# Patient Record
Sex: Female | Born: 1945
Health system: Southern US, Community
[De-identification: ages and names within clinical notes are randomized; demographics above are authoritative.]

## PROBLEM LIST (undated history)

## (undated) DIAGNOSIS — E669 Obesity, unspecified: Secondary | ICD-10-CM

## (undated) DIAGNOSIS — C44300 Unspecified malignant neoplasm of skin of unspecified part of face: Secondary | ICD-10-CM

## (undated) DIAGNOSIS — F32A Depression, unspecified: Secondary | ICD-10-CM

## (undated) DIAGNOSIS — R011 Cardiac murmur, unspecified: Secondary | ICD-10-CM

## (undated) DIAGNOSIS — G709 Myoneural disorder, unspecified: Secondary | ICD-10-CM

## (undated) DIAGNOSIS — K219 Gastro-esophageal reflux disease without esophagitis: Secondary | ICD-10-CM

## (undated) DIAGNOSIS — Z1239 Encounter for other screening for malignant neoplasm of breast: Secondary | ICD-10-CM

## (undated) DIAGNOSIS — Z8719 Personal history of other diseases of the digestive system: Secondary | ICD-10-CM

## (undated) DIAGNOSIS — R112 Nausea with vomiting, unspecified: Secondary | ICD-10-CM

## (undated) DIAGNOSIS — J45909 Unspecified asthma, uncomplicated: Secondary | ICD-10-CM

## (undated) DIAGNOSIS — Z9889 Other specified postprocedural states: Secondary | ICD-10-CM

## (undated) DIAGNOSIS — N6019 Diffuse cystic mastopathy of unspecified breast: Secondary | ICD-10-CM

## (undated) DIAGNOSIS — M1990 Unspecified osteoarthritis, unspecified site: Secondary | ICD-10-CM

## (undated) DIAGNOSIS — I10 Essential (primary) hypertension: Secondary | ICD-10-CM

## (undated) DIAGNOSIS — I8390 Asymptomatic varicose veins of unspecified lower extremity: Secondary | ICD-10-CM

## (undated) DIAGNOSIS — K635 Polyp of colon: Secondary | ICD-10-CM

## (undated) DIAGNOSIS — E039 Hypothyroidism, unspecified: Secondary | ICD-10-CM

## (undated) DIAGNOSIS — Z1211 Encounter for screening for malignant neoplasm of colon: Secondary | ICD-10-CM

## (undated) DIAGNOSIS — T7840XA Allergy, unspecified, initial encounter: Secondary | ICD-10-CM

## (undated) DIAGNOSIS — K439 Ventral hernia without obstruction or gangrene: Secondary | ICD-10-CM

## (undated) DIAGNOSIS — Z1389 Encounter for screening for other disorder: Secondary | ICD-10-CM

## (undated) DIAGNOSIS — F419 Anxiety disorder, unspecified: Secondary | ICD-10-CM

## (undated) HISTORY — DX: Encounter for screening for malignant neoplasm of colon: Z12.11

## (undated) HISTORY — DX: Cardiac murmur, unspecified: R01.1

## (undated) HISTORY — DX: Unspecified osteoarthritis, unspecified site: M19.90

## (undated) HISTORY — DX: Unspecified malignant neoplasm of skin of unspecified part of face: C44.300

## (undated) HISTORY — PX: BREAST CYST ASPIRATION: SHX578

## (undated) HISTORY — DX: Myoneural disorder, unspecified: G70.9

## (undated) HISTORY — DX: Polyp of colon: K63.5

## (undated) HISTORY — PX: FOOT SURGERY: SHX648

## (undated) HISTORY — DX: Asymptomatic varicose veins of unspecified lower extremity: I83.90

## (undated) HISTORY — PX: TONGUE BIOPSY: SHX1075

## (undated) HISTORY — DX: Depression, unspecified: F32.A

## (undated) HISTORY — DX: Allergy, unspecified, initial encounter: T78.40XA

## (undated) HISTORY — PX: FINE NEEDLE ASPIRATION: SHX406

## (undated) HISTORY — DX: Encounter for other screening for malignant neoplasm of breast: Z12.39

## (undated) HISTORY — DX: Encounter for screening for other disorder: Z13.89

## (undated) HISTORY — DX: Obesity, unspecified: E66.9

## (undated) HISTORY — DX: Ventral hernia without obstruction or gangrene: K43.9

## (undated) HISTORY — DX: Diffuse cystic mastopathy of unspecified breast: N60.19

## (undated) HISTORY — PX: TUBAL LIGATION: SHX77

## (undated) HISTORY — DX: Gastro-esophageal reflux disease without esophagitis: K21.9

---

## 1992-07-17 HISTORY — PX: ABDOMINAL HYSTERECTOMY: SHX81

## 2003-07-18 HISTORY — PX: COLONOSCOPY: SHX174

## 2004-05-10 ENCOUNTER — Ambulatory Visit: Payer: Self-pay | Admitting: General Surgery

## 2004-10-04 ENCOUNTER — Ambulatory Visit: Payer: Self-pay | Admitting: General Surgery

## 2005-07-17 HISTORY — PX: DILATION AND CURETTAGE OF UTERUS: SHX78

## 2005-10-17 ENCOUNTER — Ambulatory Visit: Payer: Self-pay | Admitting: General Surgery

## 2006-10-18 ENCOUNTER — Ambulatory Visit: Payer: Self-pay | Admitting: General Surgery

## 2007-10-22 ENCOUNTER — Ambulatory Visit: Payer: Self-pay | Admitting: General Surgery

## 2008-07-17 HISTORY — PX: LIPOMA EXCISION: SHX5283

## 2008-10-22 ENCOUNTER — Ambulatory Visit: Payer: Self-pay | Admitting: General Surgery

## 2009-07-17 DIAGNOSIS — I8390 Asymptomatic varicose veins of unspecified lower extremity: Secondary | ICD-10-CM

## 2009-07-17 HISTORY — DX: Asymptomatic varicose veins of unspecified lower extremity: I83.90

## 2009-07-17 HISTORY — PX: BREAST MASS EXCISION: SHX1267

## 2009-10-25 ENCOUNTER — Ambulatory Visit: Payer: Self-pay | Admitting: General Surgery

## 2010-11-16 ENCOUNTER — Ambulatory Visit: Payer: Self-pay | Admitting: General Surgery

## 2011-07-18 DIAGNOSIS — E669 Obesity, unspecified: Secondary | ICD-10-CM

## 2011-07-18 DIAGNOSIS — Z1211 Encounter for screening for malignant neoplasm of colon: Secondary | ICD-10-CM

## 2011-07-18 DIAGNOSIS — N6019 Diffuse cystic mastopathy of unspecified breast: Secondary | ICD-10-CM

## 2011-07-18 DIAGNOSIS — Z1389 Encounter for screening for other disorder: Secondary | ICD-10-CM

## 2011-07-18 DIAGNOSIS — Z1239 Encounter for other screening for malignant neoplasm of breast: Secondary | ICD-10-CM

## 2011-07-18 HISTORY — DX: Obesity, unspecified: E66.9

## 2011-07-18 HISTORY — DX: Encounter for screening for malignant neoplasm of colon: Z12.11

## 2011-07-18 HISTORY — DX: Encounter for screening for other disorder: Z13.89

## 2011-07-18 HISTORY — DX: Encounter for other screening for malignant neoplasm of breast: Z12.39

## 2011-07-18 HISTORY — DX: Diffuse cystic mastopathy of unspecified breast: N60.19

## 2011-11-17 ENCOUNTER — Ambulatory Visit: Payer: Self-pay | Admitting: General Surgery

## 2012-09-28 ENCOUNTER — Encounter: Payer: Self-pay | Admitting: *Deleted

## 2012-10-16 ENCOUNTER — Encounter: Payer: Self-pay | Admitting: General Surgery

## 2012-11-18 ENCOUNTER — Ambulatory Visit: Payer: Self-pay | Admitting: General Surgery

## 2012-11-19 ENCOUNTER — Encounter: Payer: Self-pay | Admitting: General Surgery

## 2012-11-25 ENCOUNTER — Other Ambulatory Visit: Payer: Self-pay | Admitting: *Deleted

## 2012-11-25 ENCOUNTER — Encounter: Payer: Self-pay | Admitting: General Surgery

## 2012-11-25 ENCOUNTER — Ambulatory Visit (INDEPENDENT_AMBULATORY_CARE_PROVIDER_SITE_OTHER): Payer: BC Managed Care – PPO | Admitting: General Surgery

## 2012-11-25 VITALS — BP 130/70 | HR 75 | Resp 16 | Ht 65.0 in | Wt 174.0 lb

## 2012-11-25 DIAGNOSIS — Z1231 Encounter for screening mammogram for malignant neoplasm of breast: Secondary | ICD-10-CM

## 2012-11-25 DIAGNOSIS — N6019 Diffuse cystic mastopathy of unspecified breast: Secondary | ICD-10-CM

## 2012-11-25 NOTE — Patient Instructions (Addendum)
Patient to return in one year after mammogram.

## 2012-11-25 NOTE — Progress Notes (Signed)
Patient ID: Joceline Hinchcliff, female   DOB: 25-Dec-1945, 67 y.o.   MRN: [id number]  Chief Complaint  Patient presents with  . Other    mammo    HPI Raquelle Pietro is a 67 y.o. female  here for a follow up fibrocystic disease.Patient reports no new breast symptoms. Since last visit patient had a stress test and passed it HPI  Past Medical History  Diagnosis Date  . Diffuse cystic mastopathy 2013  . Breast screening, unspecified 2013  . Special screening for malignant neoplasms, colon 2013  . Obesity, unspecified 2013  . Screening for obesity 2013  . Asymptomatic varicose veins 2011    Past Surgical History  Procedure Laterality Date  . Breast mass excision Left 2011  . Dilation and curettage of uterus  2007  . Abdominal hysterectomy  1994  . Colonoscopy  2007    done by Dr. Evette Cristal  . Breast cyst aspiration Right 1997?    breast aspiration d/t infected milk gland  . Lipoma excision  2010    breast bone    Family History  Problem Relation Age of Onset  . Liver cancer Mother   . Breast cancer Other     Social History History  Substance Use Topics  . Smoking status: Never Smoker   . Smokeless tobacco: Not on file  . Alcohol Use: No    Allergies  Allergen Reactions  . Shellfish Allergy Other (See Comments)    Throat closes  . Sulfa Antibiotics Swelling  . Neosporin (Neomycin-Bacitracin Zn-Polymyx) Rash    Current Outpatient Prescriptions  Medication Sig Dispense Refill  . Ascorbic Acid (VITAMIN C PO) Take by mouth.      Marland Kitchen aspirin 81 MG tablet Take 81 mg by mouth daily.      . beclomethasone (BECONASE-AQ) 42 MCG/SPRAY nasal spray Place 2 sprays into the nose as directed. Dose is for each nostril.      . Calcium-Magnesium-Vitamin D (CALCIUM 500 PO) Take by mouth.      . chlorzoxazone (PARAFON) 500 MG tablet Take 500 mg by mouth as needed for muscle spasms.      . Estradiol (ESTRACE PO) Take by mouth as directed.      . Fish Oil OIL by Does not apply route as  directed.      . Ipratropium-Albuterol (COMBIVENT IN) Inhale into the lungs as directed.      . loratadine-pseudoephedrine (CLARITIN-D 12-HOUR) 5-120 MG per tablet Take 1 tablet by mouth as needed for allergies.      . meprobamate (EQUANIL) 400 MG tablet Take 400 mg by mouth as needed for pain.      . metroNIDAZOLE (METROCREAM) 0.75 % cream Apply 1 application topically daily.      . Multiple Vitamin (MULTIVITAMIN) capsule Take 1 capsule by mouth daily.      . Zafirlukast (ACCOLATE PO) Take 20 mg by mouth 2 (two) times daily as needed.       Marland Kitchen VITAMIN E PO Take by mouth.       No current facility-administered medications for this visit.    Review of Systems Review of Systems  Constitutional: Negative.   HENT: Negative.   Respiratory: Negative.   Cardiovascular: Negative.     Blood pressure 130/70, pulse 75, resp. rate 16, height 5\' 5"  (1.651 m), weight 174 lb (78.926 kg).  Physical Exam Physical Exam  Constitutional: She is oriented to person, place, and time. She appears well-developed and well-nourished.  Eyes: Conjunctivae are normal. No scleral icterus.  Neck: Neck supple.  Cardiovascular: Normal rate, regular rhythm and normal heart sounds.   Pulmonary/Chest: Effort normal and breath sounds normal. Right breast exhibits no inverted nipple, no mass, no nipple discharge, no skin change and no tenderness. Left breast exhibits no inverted nipple, no mass, no nipple discharge, no skin change and no tenderness.  Abdominal: Soft. Bowel sounds are normal.  Lymphadenopathy:    She has no cervical adenopathy.    She has no axillary adenopathy.  Neurological: She is alert and oriented to person, place, and time.    Data Reviewed Mammogram reviewed  Cat 2   Assessment    Stable exam     Plan   1 year follow up  mammogram      SANKAR,SEEPLAPUTHUR G 11/26/2012, 6:29 PM

## 2012-11-25 NOTE — Progress Notes (Signed)
Patient will be asked to return to the office in one year for a bilateral screening mammogram. 

## 2012-11-26 ENCOUNTER — Encounter: Payer: Self-pay | Admitting: General Surgery

## 2012-11-26 DIAGNOSIS — N6019 Diffuse cystic mastopathy of unspecified breast: Secondary | ICD-10-CM | POA: Insufficient documentation

## 2012-12-17 ENCOUNTER — Ambulatory Visit: Payer: Self-pay | Admitting: General Surgery

## 2013-11-25 ENCOUNTER — Encounter: Payer: Self-pay | Admitting: General Surgery

## 2013-12-09 ENCOUNTER — Encounter: Payer: Self-pay | Admitting: General Surgery

## 2013-12-09 ENCOUNTER — Ambulatory Visit (INDEPENDENT_AMBULATORY_CARE_PROVIDER_SITE_OTHER): Payer: BC Managed Care – PPO | Admitting: General Surgery

## 2013-12-09 VITALS — BP 128/74 | HR 76 | Resp 12 | Ht 63.0 in | Wt 175.0 lb

## 2013-12-09 DIAGNOSIS — Z87898 Personal history of other specified conditions: Secondary | ICD-10-CM

## 2013-12-09 DIAGNOSIS — Z1231 Encounter for screening mammogram for malignant neoplasm of breast: Secondary | ICD-10-CM

## 2013-12-09 NOTE — Patient Instructions (Addendum)
Patient will be asked to return to the office in one year with a bilateral screening mammogram.  Continue self breast exams. Call office for any new breast issues or concerns.  

## 2013-12-09 NOTE — Progress Notes (Signed)
Patient ID: Meredith Scott, female   DOB: [birth year], 68 y.o.   MRN: [id number]  Chief Complaint  Patient presents with  . Follow-up    mammogram    HPI Meredith Scott is a 68 y.o. female who presents for a breast evaluation. The most recent mammogram was done on 11/25/13 at [facility]. Patient does perform regular self breast checks and gets regular mammograms done. No breast problems noted. Patient states she is having thyroid problems. Being followed by Dr. Venia Minks.  HPI  Past Medical History  Diagnosis Date  . Diffuse cystic mastopathy 2013  . Breast screening, unspecified 2013  . Special screening for malignant neoplasms, colon 2013  . Obesity, unspecified 2013  . Screening for obesity 2013  . Asymptomatic varicose veins 2011    Past Surgical History  Procedure Laterality Date  . Breast mass excision Left 2011  . Dilation and curettage of uterus  2007  . Abdominal hysterectomy  1994  . Colonoscopy  2007    done by Dr. Jamal Collin  . Breast cyst aspiration Right 1997?    breast aspiration d/t infected milk gland  . Lipoma excision  2010    breast bone    Family History  Problem Relation Age of Onset  . Liver cancer Mother   . Breast cancer Other   . Diabetes Father     Social History History  Substance Use Topics  . Smoking status: Never Smoker   . Smokeless tobacco: Never Used  . Alcohol Use: No    Allergies  Allergen Reactions  . Shellfish Allergy Other (See Comments)    Throat closes  . Sulfa Antibiotics Swelling  . Neosporin [Neomycin-Bacitracin Zn-Polymyx] Rash    Current Outpatient Prescriptions  Medication Sig Dispense Refill  . Ascorbic Acid (VITAMIN C PO) Take by mouth.      Marland Kitchen aspirin 81 MG tablet Take 81 mg by mouth daily.      . beclomethasone (BECONASE-AQ) 42 MCG/SPRAY nasal spray Place 2 sprays into the nose as directed. Dose is for each nostril.      . Calcium-Magnesium-Vitamin D (CALCIUM 500 PO) Take by mouth.      . chlorzoxazone (PARAFON) 500 MG  tablet Take 500 mg by mouth as needed for muscle spasms.      . Estradiol (ESTRACE PO) Take by mouth as directed.      . Fish Oil OIL by Does not apply route as directed.      . Ipratropium-Albuterol (COMBIVENT IN) Inhale into the lungs as directed.      Marland Kitchen levothyroxine (SYNTHROID, LEVOTHROID) 25 MCG tablet Take 1 tablet by mouth daily.      Marland Kitchen loratadine-pseudoephedrine (CLARITIN-D 12-HOUR) 5-120 MG per tablet Take 1 tablet by mouth as needed for allergies.      . meprobamate (EQUANIL) 400 MG tablet Take 400 mg by mouth as needed for pain.      . metroNIDAZOLE (METROCREAM) 0.75 % cream Apply 1 application topically daily.      . Multiple Vitamin (MULTIVITAMIN) capsule Take 1 capsule by mouth daily.      Marland Kitchen VITAMIN E PO Take by mouth.      . Zafirlukast (ACCOLATE PO) Take 20 mg by mouth 2 (two) times daily as needed.        No current facility-administered medications for this visit.    Review of Systems Review of Systems  Constitutional: Negative.   Respiratory: Negative.   Cardiovascular: Negative.     Blood pressure 128/74, pulse 76, resp. rate  12, height 5\' 3"  (1.6 m), weight 175 lb (79.379 kg).  Physical Exam Physical Exam  Constitutional: She is oriented to person, place, and time. She appears well-developed and well-nourished.  Eyes: Conjunctivae are normal. No scleral icterus.  Neck: Neck supple. No thyromegaly present.  Cardiovascular: Normal rate, regular rhythm and normal heart sounds.   Pulmonary/Chest: Effort normal and breath sounds normal. Right breast exhibits no inverted nipple, no mass, no nipple discharge, no skin change and no tenderness. Left breast exhibits no inverted nipple, no mass, no nipple discharge, no skin change and no tenderness.  Abdominal: Soft. Bowel sounds are normal. There is no tenderness.  Lymphadenopathy:    She has no cervical adenopathy.    She has no axillary adenopathy.  Neurological: She is alert and oriented to person, place, and time.   Skin: Skin is warm and dry.    Data Reviewed Mammogram reviewed Assessment    Stable exam, History of fibrocyst diseases    Plan    Patient will be asked to return to the office in one year with a bilateral screening mammogram.       Garrie Woodin G Kei Mcelhiney 12/09/2013, 1:03 PM

## 2013-12-23 DIAGNOSIS — E039 Hypothyroidism, unspecified: Secondary | ICD-10-CM | POA: Insufficient documentation

## 2014-05-18 ENCOUNTER — Encounter: Payer: Self-pay | Admitting: General Surgery

## 2014-06-04 ENCOUNTER — Ambulatory Visit: Payer: Self-pay | Admitting: Otolaryngology

## 2014-07-20 ENCOUNTER — Ambulatory Visit: Payer: Self-pay | Admitting: Otolaryngology

## 2014-07-23 ENCOUNTER — Ambulatory Visit: Payer: BC Managed Care – PPO | Admitting: General Surgery

## 2014-08-05 ENCOUNTER — Encounter: Payer: Self-pay | Admitting: *Deleted

## 2014-10-27 ENCOUNTER — Other Ambulatory Visit: Payer: Self-pay

## 2014-10-27 ENCOUNTER — Ambulatory Visit (INDEPENDENT_AMBULATORY_CARE_PROVIDER_SITE_OTHER): Payer: BLUE CROSS/BLUE SHIELD | Admitting: General Surgery

## 2014-10-27 ENCOUNTER — Encounter: Payer: Self-pay | Admitting: General Surgery

## 2014-10-27 VITALS — BP 132/8 | HR 80 | Resp 16 | Ht 64.0 in | Wt 175.0 lb

## 2014-10-27 DIAGNOSIS — Z1211 Encounter for screening for malignant neoplasm of colon: Secondary | ICD-10-CM | POA: Diagnosis not present

## 2014-10-27 MED ORDER — POLYETHYLENE GLYCOL 3350 17 GM/SCOOP PO POWD: 1.0000 | Freq: Once | ORAL | Status: DC

## 2014-10-27 NOTE — Patient Instructions (Signed)
Colonoscopy  A colonoscopy is an exam to look at the entire large intestine (colon). This exam can help find problems such as tumors, polyps, inflammation, and areas of bleeding. The exam takes about 1 hour.   LET YOUR HEALTH CARE PROVIDER KNOW ABOUT:   · Any allergies you have.  · All medicines you are taking, including vitamins, herbs, eye drops, creams, and over-the-counter medicines.  · Previous problems you or members of your family have had with the use of anesthetics.  · Any blood disorders you have.  · Previous surgeries you have had.  · Medical conditions you have.  RISKS AND COMPLICATIONS   Generally, this is a safe procedure. However, as with any procedure, complications can occur. Possible complications include:  · Bleeding.  · Tearing or rupture of the colon wall.  · Reaction to medicines given during the exam.  · Infection (rare).  BEFORE THE PROCEDURE   · Ask your health care provider about changing or stopping your regular medicines.  · You may be prescribed an oral bowel prep. This involves drinking a large amount of medicated liquid, starting the day before your procedure. The liquid will cause you to have multiple loose stools until your stool is almost clear or light green. This cleans out your colon in preparation for the procedure.  · Do not eat or drink anything else once you have started the bowel prep, unless your health care provider tells you it is safe to do so.  · Arrange for someone to drive you home after the procedure.  PROCEDURE   · You will be given medicine to help you relax (sedative).  · You will lie on your side with your knees bent.  · A long, flexible tube with a light and camera on the end (colonoscope) will be inserted through the rectum and into the colon. The camera sends video back to a computer screen as it moves through the colon. The colonoscope also releases carbon dioxide gas to inflate the colon. This helps your health care provider see the area better.  · During  the exam, your health care provider may take a small tissue sample (biopsy) to be examined under a microscope if any abnormalities are found.  · The exam is finished when the entire colon has been viewed.  AFTER THE PROCEDURE   · Do not drive for 24 hours after the exam.  · You may have a small amount of blood in your stool.  · You may pass moderate amounts of gas and have mild abdominal cramping or bloating. This is caused by the gas used to inflate your colon during the exam.  · Ask when your test results will be ready and how you will get your results. Make sure you get your test results.  Document Released: 06/30/2000 Document Revised: 04/23/2013 Document Reviewed: 03/10/2013  [employer]® Patient Information ©2015 [employer]. This information is not intended to replace advice given to you by your health care provider. Make sure you discuss any questions you have with your health care provider.

## 2014-10-27 NOTE — Progress Notes (Signed)
Patient ID: Meredith Scott, female   DOB: [birth year], 69 y.o.   MRN: [id number] Patient is scheduled for a colonoscopy at [facility] on 01/27/15. She is aware to pre register with the hospital at least 2 days prior. She will stop her fish oil 1 week prior. She may continue to take her 81 mg Aspirin. Miralax prescription has been sent into her pharmacy. Patient is aware of date and instructions.

## 2014-10-27 NOTE — Progress Notes (Signed)
This is a 69 year old female here today to discussed colonoscopy.  She had her last colonoscopy in 2005-diverticulosis. Pt wanted to have the colonoscopy in July and came here to schedule. She has a scheduled yrly breast follow up in May. Exam as not done today- can be completed at her May visit. No GI complaints. Discussed some questions regarding prep for colonoscopy.

## 2014-10-27 NOTE — Progress Notes (Deleted)
Patient ID: Meredith Scott, female   DOB: 03-Jan-1946, 69 y.o.   MRN: [id number]  Chief Complaint  Patient presents with  . Other    colonoscopy    HPI Meredith Scott is a 69 y.o. female  HPI  Past Medical History  Diagnosis Date  . Diffuse cystic mastopathy 2013  . Breast screening, unspecified 2013  . Special screening for malignant neoplasms, colon 2013  . Obesity, unspecified 2013  . Screening for obesity 2013  . Asymptomatic varicose veins 2011    Past Surgical History  Procedure Laterality Date  . Breast mass excision Left 2011  . Dilation and curettage of uterus  2007  . Abdominal hysterectomy  1994  . Colonoscopy  2005    done by Dr. Jamal Collin  . Breast cyst aspiration Right 1997?    breast aspiration d/t infected milk gland  . Lipoma excision  2010    breast bone  . Biopsy thyroid  07/2014    Family History  Problem Relation Age of Onset  . Liver cancer Mother   . Breast cancer Other   . Diabetes Father     Social History History  Substance Use Topics  . Smoking status: Never Smoker   . Smokeless tobacco: Never Used  . Alcohol Use: No    Allergies  Allergen Reactions  . Shellfish Allergy Other (See Comments)    Throat closes  . Sulfa Antibiotics Swelling  . Neosporin [Neomycin-Bacitracin Zn-Polymyx] Rash    Current Outpatient Prescriptions  Medication Sig Dispense Refill  . Ascorbic Acid (VITAMIN C PO) Take by mouth.    Marland Kitchen aspirin 81 MG tablet Take 81 mg by mouth daily.    . beclomethasone (BECONASE-AQ) 42 MCG/SPRAY nasal spray Place 2 sprays into the nose as directed. Dose is for each nostril.    . Calcium-Magnesium-Vitamin D (CALCIUM 500 PO) Take by mouth.    . chlorzoxazone (PARAFON) 500 MG tablet Take 500 mg by mouth as needed for muscle spasms.    . Estradiol (ESTRACE PO) Take by mouth as directed.    . Fish Oil OIL by Does not apply route as directed.    . Ipratropium-Albuterol (COMBIVENT IN) Inhale into the lungs as directed.    Marland Kitchen  levothyroxine (SYNTHROID, LEVOTHROID) 25 MCG tablet Take 1 tablet by mouth daily.    Marland Kitchen loratadine-pseudoephedrine (CLARITIN-D 12-HOUR) 5-120 MG per tablet Take 1 tablet by mouth as needed for allergies.    . meprobamate (EQUANIL) 400 MG tablet Take 400 mg by mouth as needed for pain.    . metroNIDAZOLE (METROCREAM) 0.75 % cream Apply 1 application topically daily.    . Multiple Vitamin (MULTIVITAMIN) capsule Take 1 capsule by mouth daily.    Marland Kitchen VITAMIN E PO Take by mouth.    . Zafirlukast (ACCOLATE PO) Take 20 mg by mouth 2 (two) times daily as needed.      No current facility-administered medications for this visit.    Review of Systems Review of Systems    Blood pressure 132/8, pulse 80, resp. rate 16, height 5\' 4"  (1.626 m), weight 175 lb (79.379 kg).  Physical Exam Physical Exam  Neck: Neck supple.    Data Reviewed ***  Assessment    ***    Plan        Gaspar Cola 10/27/2014, 8:55 AM

## 2014-10-29 ENCOUNTER — Other Ambulatory Visit: Payer: Self-pay

## 2014-10-29 DIAGNOSIS — Z1231 Encounter for screening mammogram for malignant neoplasm of breast: Secondary | ICD-10-CM

## 2014-11-09 LAB — SURGICAL PATHOLOGY

## 2014-11-15 NOTE — Op Note (Signed)
PATIENT NAME:  Meredith Scott, Meredith Scott MR#:  [id number] DATE OF BIRTH:  [birth year]  DATE OF PROCEDURE:  07/20/2014  PREOPERATIVE DIAGNOSIS: Right tongue base fullness and discomfort.  POSTOPERATIVE DIAGNOSIS: Right tongue base lesion causing symptoms.   OPERATIVE PROCEDURE: Direct microlaryngoscopy with biopsy of right tongue base lesion.   SURGEON: Huey Romans, MD    ANESTHESIA: General.   COMPLICATIONS: None.   TOTAL ESTIMATED BLOOD LOSS: 25 mL at the most.   DESCRIPTION OF PROCEDURE: The patient was given general anesthesia by oral endotracheal intubation. A small oroendotracheal tube was placed and put on the left side of the mouth. A tooth guard was used to protect her teeth and then a Dedo laryngoscope was used for visualization of the hypopharynx and larynx. The piriform sinuses were clear. The epiglottis was clear. The vallecula was clear. Vocal cords were pearly white. No sign of lesions in the endolarynx or deep hypopharynx.   There was a little bit of fullness in the right tongue base. It looked like it was lymph tissue that was on the surface. I removed the scope and palpated this area. There were no firm masses that I could feel. I could see some lymph tissue at the lateral tongue base where it hooked up to the lateral wall. Some of this was biopsied away using cup biting forceps. This was sent for permanent section. There were no other lesions noted anywhere in the hypopharynx or posterior pharynx.   A Louie Arm was used during this procedure to stabilize it. The 0-degree endoscope was used for magnification and visualization of these areas to see if any other abnormalities were noted. Once biopsies were taken, phenylephrine on Cottonoid pledgets was used to help with vasoconstriction and control bleeding.   The patient tolerated the procedure well. She was awakened and taken to the recovery room in satisfactory condition. There were no operative complications. Total estimated blood  loss was less than 25 mL.    ____________________________ Huey Romans, MD phj:ah D: 07/20/2014 08:10:05 ET T: 07/20/2014 08:26:33 ET JOB#: [id number]  cc: Huey Romans, MD, <Dictator> Huey Romans MD ELECTRONICALLY SIGNED 08/03/2014 8:54

## 2014-12-10 ENCOUNTER — Ambulatory Visit (INDEPENDENT_AMBULATORY_CARE_PROVIDER_SITE_OTHER): Payer: BLUE CROSS/BLUE SHIELD | Admitting: General Surgery

## 2014-12-10 ENCOUNTER — Encounter: Payer: Self-pay | Admitting: General Surgery

## 2014-12-10 VITALS — BP 132/74 | HR 76 | Resp 12 | Ht 63.0 in | Wt 176.0 lb

## 2014-12-10 DIAGNOSIS — Z87898 Personal history of other specified conditions: Secondary | ICD-10-CM | POA: Diagnosis not present

## 2014-12-10 NOTE — Progress Notes (Signed)
Patient ID: Meredith Scott, female   DOB: [birth year], 69 y.o.   MRN: [id number]  Chief Complaint  Patient presents with  . Follow-up    mammogram    HPI Meredith Scott is a 69 y.o. female who presents for a breast evaluation. The most recent mammogram was done on 11/30/14.  Patient does perform regular self breast checks and gets regular mammograms done.    HPI  Past Medical History  Diagnosis Date  . Diffuse cystic mastopathy 2013  . Breast screening, unspecified 2013  . Special screening for malignant neoplasms, colon 2013  . Obesity, unspecified 2013  . Screening for obesity 2013  . Asymptomatic varicose veins 2011    Past Surgical History  Procedure Laterality Date  . Breast mass excision Left 2011  . Dilation and curettage of uterus  2007  . Abdominal hysterectomy  1994  . Colonoscopy  2005    done by Dr. Jamal Collin  . Breast cyst aspiration Right 1997?    breast aspiration d/t infected milk gland  . Lipoma excision  2010    breast bone  . Tongue biopsy      Family History  Problem Relation Age of Onset  . Liver cancer Mother   . Breast cancer Other   . Diabetes Father     Social History History  Substance Use Topics  . Smoking status: Never Smoker   . Smokeless tobacco: Never Used  . Alcohol Use: No    Allergies  Allergen Reactions  . Shellfish Allergy Other (See Comments)    Throat closes  . Sulfa Antibiotics Swelling  . Neosporin [Neomycin-Bacitracin Zn-Polymyx] Rash    Current Outpatient Prescriptions  Medication Sig Dispense Refill  . Ascorbic Acid (VITAMIN C PO) Take by mouth.    Marland Kitchen aspirin 81 MG tablet Take 81 mg by mouth daily.    . beclomethasone (BECONASE-AQ) 42 MCG/SPRAY nasal spray Place 2 sprays into the nose as directed. Dose is for each nostril.    . Calcium-Magnesium-Vitamin D (CALCIUM 500 PO) Take by mouth.    . chlorzoxazone (PARAFON) 500 MG tablet Take 500 mg by mouth as needed for muscle spasms.    . Estradiol (ESTRACE PO) Take  by mouth as directed.    . Fish Oil OIL by Does not apply route as directed.    . Ipratropium-Albuterol (COMBIVENT IN) Inhale into the lungs as directed.    Marland Kitchen levothyroxine (SYNTHROID, LEVOTHROID) 25 MCG tablet Take 1 tablet by mouth daily.    Marland Kitchen loratadine-pseudoephedrine (CLARITIN-D 12-HOUR) 5-120 MG per tablet Take 1 tablet by mouth as needed for allergies.    . meprobamate (EQUANIL) 400 MG tablet Take 400 mg by mouth as needed for pain.    . metroNIDAZOLE (METROCREAM) 0.75 % cream Apply 1 application topically daily.    . Multiple Vitamin (MULTIVITAMIN) capsule Take 1 capsule by mouth daily.    . polyethylene glycol powder (GLYCOLAX/MIRALAX) powder Take 255 g by mouth once. Mix in 64 oz, or 2 quarts of clear liquid. 255 g 0  . VITAMIN E PO Take by mouth.    . Zafirlukast (ACCOLATE PO) Take 20 mg by mouth 2 (two) times daily as needed.      No current facility-administered medications for this visit.    Review of Systems Review of Systems  Constitutional: Negative.   Respiratory: Negative.   Cardiovascular: Negative.     Blood pressure 132/74, pulse 76, resp. rate 12, height 5\' 3"  (1.6 m), weight 176 lb (79.833 kg).  Physical Exam Physical Exam  Constitutional: She is oriented to person, place, and time. She appears well-developed and well-nourished.  Eyes: Conjunctivae are normal. No scleral icterus.  Neck: Neck supple.  Cardiovascular: Normal rate, regular rhythm and normal heart sounds.   Pulmonary/Chest: Effort normal and breath sounds normal. Right breast exhibits no inverted nipple, no mass, no nipple discharge, no skin change and no tenderness. Left breast exhibits no inverted nipple, no mass, no nipple discharge, no skin change and no tenderness.  Lymphadenopathy:    She has no cervical adenopathy.    She has no axillary adenopathy.  Neurological: She is alert and oriented to person, place, and time.  Skin: Skin is warm and dry.    Data Reviewed Mammogram  reviewed  Assessment    Stable exam. FCD.     Plan    The patient has been asked to return to the office in one year with a bilateral screening mammogram.Patient is scheduled for a colonoscopy on 12/28/14.      PCP:  Otho Darner 12/10/2014, 3:15 PM

## 2014-12-10 NOTE — Patient Instructions (Addendum)
The patient has been asked to return to the office in one year with a bilateral screening mammogram. Continue self breast exams. Call office for any new breast issues or concerns.  

## 2014-12-17 ENCOUNTER — Other Ambulatory Visit: Payer: Self-pay | Admitting: Family Medicine

## 2015-01-20 ENCOUNTER — Other Ambulatory Visit: Payer: Self-pay | Admitting: General Surgery

## 2015-01-20 DIAGNOSIS — Z1211 Encounter for screening for malignant neoplasm of colon: Secondary | ICD-10-CM

## 2015-01-26 ENCOUNTER — Encounter: Payer: Self-pay | Admitting: *Deleted

## 2015-01-27 ENCOUNTER — Ambulatory Visit
Admission: RE | Admit: 2015-01-27 | Discharge: 2015-01-27 | Disposition: A | Discharge status: Home / Self Care | Payer: BLUE CROSS/BLUE SHIELD | Source: Ambulatory Visit | Attending: General Surgery | Admitting: General Surgery

## 2015-01-27 ENCOUNTER — Encounter
Admission: RE | Disposition: A | Discharge status: Home / Self Care | Payer: Self-pay | Source: Ambulatory Visit | Attending: General Surgery

## 2015-01-27 ENCOUNTER — Ambulatory Visit: Payer: BLUE CROSS/BLUE SHIELD | Admitting: Anesthesiology

## 2015-01-27 ENCOUNTER — Encounter: Payer: Self-pay | Admitting: *Deleted

## 2015-01-27 DIAGNOSIS — J45909 Unspecified asthma, uncomplicated: Secondary | ICD-10-CM | POA: Diagnosis not present

## 2015-01-27 DIAGNOSIS — I8390 Asymptomatic varicose veins of unspecified lower extremity: Secondary | ICD-10-CM | POA: Insufficient documentation

## 2015-01-27 DIAGNOSIS — F419 Anxiety disorder, unspecified: Secondary | ICD-10-CM | POA: Insufficient documentation

## 2015-01-27 DIAGNOSIS — Z7982 Long term (current) use of aspirin: Secondary | ICD-10-CM | POA: Diagnosis not present

## 2015-01-27 DIAGNOSIS — Z6831 Body mass index (BMI) 31.0-31.9, adult: Secondary | ICD-10-CM | POA: Diagnosis not present

## 2015-01-27 DIAGNOSIS — Z833 Family history of diabetes mellitus: Secondary | ICD-10-CM | POA: Diagnosis not present

## 2015-01-27 DIAGNOSIS — Z1211 Encounter for screening for malignant neoplasm of colon: Secondary | ICD-10-CM | POA: Insufficient documentation

## 2015-01-27 DIAGNOSIS — E039 Hypothyroidism, unspecified: Secondary | ICD-10-CM | POA: Diagnosis not present

## 2015-01-27 DIAGNOSIS — Z800 Family history of malignant neoplasm of digestive organs: Secondary | ICD-10-CM | POA: Insufficient documentation

## 2015-01-27 DIAGNOSIS — Z79899 Other long term (current) drug therapy: Secondary | ICD-10-CM | POA: Diagnosis not present

## 2015-01-27 DIAGNOSIS — E669 Obesity, unspecified: Secondary | ICD-10-CM | POA: Diagnosis not present

## 2015-01-27 DIAGNOSIS — Z881 Allergy status to other antibiotic agents status: Secondary | ICD-10-CM | POA: Insufficient documentation

## 2015-01-27 DIAGNOSIS — K5730 Diverticulosis of large intestine without perforation or abscess without bleeding: Secondary | ICD-10-CM | POA: Diagnosis not present

## 2015-01-27 DIAGNOSIS — Z882 Allergy status to sulfonamides status: Secondary | ICD-10-CM | POA: Diagnosis not present

## 2015-01-27 DIAGNOSIS — Z803 Family history of malignant neoplasm of breast: Secondary | ICD-10-CM | POA: Diagnosis not present

## 2015-01-27 DIAGNOSIS — D123 Benign neoplasm of transverse colon: Secondary | ICD-10-CM | POA: Insufficient documentation

## 2015-01-27 DIAGNOSIS — Z91013 Allergy to seafood: Secondary | ICD-10-CM | POA: Diagnosis not present

## 2015-01-27 DIAGNOSIS — Z90710 Acquired absence of both cervix and uterus: Secondary | ICD-10-CM | POA: Diagnosis not present

## 2015-01-27 DIAGNOSIS — I10 Essential (primary) hypertension: Secondary | ICD-10-CM | POA: Insufficient documentation

## 2015-01-27 HISTORY — PX: COLONOSCOPY: SHX5424

## 2015-01-27 HISTORY — DX: Hypothyroidism, unspecified: E03.9

## 2015-01-27 HISTORY — DX: Unspecified asthma, uncomplicated: J45.909

## 2015-01-27 HISTORY — DX: Essential (primary) hypertension: I10

## 2015-01-27 HISTORY — DX: Anxiety disorder, unspecified: F41.9

## 2015-01-27 SURGERY — COLONOSCOPY
Anesthesia: General

## 2015-01-27 MED ORDER — LACTATED RINGERS IV SOLN
INTRAVENOUS | Status: DC
Start: 2015-01-27 — End: 2015-01-27
  Administered 2015-01-27: 08:00:00 via INTRAVENOUS
  Administered 2015-01-27: 1000 mL via INTRAVENOUS

## 2015-01-27 MED ORDER — PROPOFOL 10 MG/ML IV BOLUS
INTRAVENOUS | Status: DC | PRN
  Administered 2015-01-27: 50 mg via INTRAVENOUS

## 2015-01-27 MED ORDER — PROPOFOL INFUSION 10 MG/ML OPTIME
INTRAVENOUS | Status: DC | PRN
  Administered 2015-01-27: 75 ug/kg/min via INTRAVENOUS

## 2015-01-27 NOTE — Op Note (Signed)
[facility] Gastroenterology Patient Name: Meredith Scott Procedure Date: 01/27/2015 7:57 AM MRN: [id number] Account #: [id number] Date of Birth: 01/31/1946 Admit Type: Outpatient Age: 69 Room: [facility] ENDO ROOM 1 Gender: Female Note Status: Finalized Procedure:         Colonoscopy Indications:       Screening for colorectal malignant neoplasm Providers:         Orlie Pollen, MD Referring MD:      Jerrell Belfast, MD (Referring MD) Medicines:         General Anesthesia Complications:     No immediate complications. Procedure:         Pre-Anesthesia Assessment:                    - General anesthesia under the supervision of an                     anesthesiologist was determined to be medically necessary                     for this procedure based on review of the patient's                     medical history, medications, and prior anesthesia history.                    After obtaining informed consent, the colonoscope was                     passed under direct vision. Throughout the procedure, the                     patient's blood pressure, pulse, and oxygen saturations                     were monitored continuously. The Olympus PCF-H180AL                     colonoscope ( S#: [id number] ) was introduced through the                     anus and advanced to the the cecum, identified by the                     ileocecal valve. The colonoscopy was performed without                     difficulty. The patient tolerated the procedure well. The                     quality of the bowel preparation was good. Findings:      The perianal and digital rectal examinations were normal.      Multiple small and large-mouthed diverticula were found in the sigmoid       colon.      A 3 mm polyp was found at the hepatic flexure. The polyp was       semi-pedunculated. The polyp was removed with a hot snare. Resection and       retrieval were complete.      The exam was  otherwise without abnormality on direct and retroflexion       views. Impression:        - Diverticulosis in the sigmoid colon.                    -  One 3 mm polyp at the hepatic flexure. Resected and                     retrieved.                    - The examination was otherwise normal on direct and                     retroflexion views. Recommendation:    - Use fiber, for example Citrucel, Fibercon, Konsyl or                     Metamucil.                    - Repeat colonoscopy in 5 years for surveillance. Procedure Code(s): --- Professional ---                    [phone], Colonoscopy, flexible; with removal of tumor(s),                     polyp(s), or other lesion(s) by snare technique Diagnosis Code(s): --- Professional ---                    Z12.11, Encounter for screening for malignant neoplasm of                     colon                    D12.3, Benign neoplasm of transverse colon                    K57.30, Diverticulosis of large intestine without                     perforation or abscess without bleeding CPT copyright 2014 American Medical Association. All rights reserved. The codes documented in this report are preliminary and upon coder review may  be revised to meet current compliance requirements. Orlie Pollen, MD 01/27/2015 9:01:04 AM This report has been signed electronically. Number of Addenda: 0 Note Initiated On: 01/27/2015 7:57 AM Scope Withdrawal Time: 0 hours 6 minutes 1 second  Total Procedure Duration: 0 hours 37 minutes 45 seconds       [facility]

## 2015-01-27 NOTE — H&P (Signed)
Meredith Scott is an 69 y.o. female.   Chief Complaint: Screening colonioscopy HPI: Pt here for planned screening colonoscopy.  Past Medical History  Diagnosis Date  . Diffuse cystic mastopathy 2013  . Breast screening, unspecified 2013  . Special screening for malignant neoplasms, colon 2013  . Obesity, unspecified 2013  . Screening for obesity 2013  . Asymptomatic varicose veins 2011  . Hypothyroidism   . Asthma   . Hypertension   . Anxiety     Past Surgical History  Procedure Laterality Date  . Breast mass excision Left 2011  . Dilation and curettage of uterus  2007  . Abdominal hysterectomy  1994  . Colonoscopy  2005    done by Dr. Jamal Collin  . Breast cyst aspiration Right 1997?    breast aspiration d/t infected milk gland  . Lipoma excision  2010    breast bone  . Tongue biopsy      Family History  Problem Relation Age of Onset  . Liver cancer Mother   . Breast cancer Other   . Diabetes Father    Social History:  reports that she has never smoked. She has never used smokeless tobacco. She reports that she does not drink alcohol or use illicit drugs.  Allergies:  Allergies  Allergen Reactions  . Shellfish Allergy Other (See Comments)    Throat closes  . Sulfa Antibiotics Swelling  . Neosporin [Neomycin-Bacitracin Zn-Polymyx] Rash    Medications Prior to Admission  Medication Sig Dispense Refill  . beclomethasone (BECONASE-AQ) 42 MCG/SPRAY nasal spray Place 2 sprays into the nose as directed. Dose is for each nostril.    Marland Kitchen levothyroxine (SYNTHROID, LEVOTHROID) 25 MCG tablet Take 1 tablet by mouth daily.    Marland Kitchen loratadine-pseudoephedrine (CLARITIN-D 12-HOUR) 5-120 MG per tablet Take 1 tablet by mouth as needed for allergies.    . Ascorbic Acid (VITAMIN C PO) Take by mouth.    Marland Kitchen aspirin 81 MG tablet Take 81 mg by mouth daily.    . Calcium-Magnesium-Vitamin D (CALCIUM 500 PO) Take by mouth.    . chlorzoxazone (PARAFON) 500 MG tablet Take 500 mg by mouth as needed  for muscle spasms.    Marland Kitchen escitalopram (LEXAPRO) 10 MG tablet Take 1 tablet (10 mg total) by mouth daily. 30 tablet 5  . Estradiol (ESTRACE PO) Take by mouth as directed.    . Fish Oil OIL by Does not apply route as directed.    . Ipratropium-Albuterol (COMBIVENT IN) Inhale into the lungs as directed.    . meprobamate (EQUANIL) 400 MG tablet Take 400 mg by mouth as needed for pain.    . metroNIDAZOLE (METROCREAM) 0.75 % cream Apply 1 application topically daily.    . Multiple Vitamin (MULTIVITAMIN) capsule Take 1 capsule by mouth daily.    . polyethylene glycol powder (GLYCOLAX/MIRALAX) powder Take 255 g by mouth once. Mix in 64 oz, or 2 quarts of clear liquid. 255 g 0  . VITAMIN E PO Take by mouth.    . Zafirlukast (ACCOLATE PO) Take 20 mg by mouth 2 (two) times daily as needed.       No results found for this or any previous visit (from the past 48 hour(s)). No results found.  Review of Systems  Constitutional: Negative.   HENT: Negative.   Respiratory: Negative.   Cardiovascular: Negative.   Gastrointestinal: Negative.     Blood pressure 130/58, pulse 69, temperature 98 F (36.7 C), temperature source Tympanic, resp. rate 18, height 5\' 3"  (  1.6 m), weight 175 lb (79.379 kg), SpO2 99 %. Physical Exam  Constitutional: She appears well-developed and well-nourished.  Eyes: Conjunctivae are normal. No scleral icterus.  Neck: Neck supple. No thyromegaly present.  Cardiovascular: Normal rate, regular rhythm and normal heart sounds.   Respiratory: Effort normal and breath sounds normal.  GI: Soft. Bowel sounds are normal. She exhibits no mass. There is no tenderness. There is no rebound and no guarding.  Lymphadenopathy:    She has no cervical adenopathy.     Assessment/Plan Stable exam. Proceed with colonoscopy as planned  SANKAR,SEEPLAPUTHUR G 01/27/2015, 7:56 AM

## 2015-01-27 NOTE — Anesthesia Preprocedure Evaluation (Signed)
Anesthesia Evaluation  Patient identified by MRN, date of birth, ID band Patient awake    Reviewed: Allergy & Precautions, H&P , NPO status , Patient's Chart, lab work & pertinent test results, reviewed documented beta blocker date and time   History of Anesthesia Complications (+) PONV and history of anesthetic complications  Airway Mallampati: II  TM Distance: >3 FB Neck ROM: full    Dental no notable dental hx. (+) Teeth Intact   Pulmonary asthma ,  breath sounds clear to auscultation  Pulmonary exam normal       Cardiovascular Exercise Tolerance: Good hypertension, - Past MI Normal cardiovascular examRhythm:regular Rate:Normal     Neuro/Psych PSYCHIATRIC DISORDERS negative neurological ROS     GI/Hepatic negative GI ROS, Neg liver ROS,   Endo/Other  negative endocrine ROSHypothyroidism   Renal/GU negative Renal ROS  negative genitourinary   Musculoskeletal   Abdominal   Peds  Hematology negative hematology ROS (+)   Anesthesia Other Findings Past Medical History:   Diffuse cystic mastopathy                       2013         Breast screening, unspecified                   2013         Special screening for malignant neoplasms, col* 2013         Obesity, unspecified                            2013         Screening for obesity                           2013         Asymptomatic varicose veins                     2011         Hypothyroidism                                               Asthma                                                       Hypertension                                                 Anxiety                                                      Reproductive/Obstetrics negative OB ROS                             Anesthesia Physical Anesthesia Plan  ASA: III  Anesthesia Plan: General   Post-op Pain Management:    Induction:   Airway Management Planned:    Additional Equipment:   Intra-op Plan:   Post-operative Plan:   Informed Consent: I have reviewed the patients History and Physical, chart, labs and discussed the procedure including the risks, benefits and alternatives for the proposed anesthesia with the patient or authorized representative who has indicated his/her understanding and acceptance.   Dental Advisory Given  Plan Discussed with: Anesthesiologist, CRNA and Surgeon  Anesthesia Plan Comments:         Anesthesia Quick Evaluation

## 2015-01-27 NOTE — Anesthesia Postprocedure Evaluation (Signed)
  Anesthesia Post-op Note  Patient: Meredith Scott  Procedure(s) Performed: Procedure(s): COLONOSCOPY (N/A)  Anesthesia type:General  Patient location: PACU  Post pain: Pain level controlled  Post assessment: Post-op Vital signs reviewed, Patient's Cardiovascular Status Stable, Respiratory Function Stable, Patent Airway and No signs of Nausea or vomiting  Post vital signs: Reviewed and stable  Last Vitals:  Filed Vitals:   01/27/15 0930  BP: 130/67  Pulse: 59  Temp:   Resp: 18    Level of consciousness: awake, alert  and patient cooperative  Complications: No apparent anesthesia complications

## 2015-01-27 NOTE — Transfer of Care (Signed)
Immediate Anesthesia Transfer of Care Note  Patient: Meredith Scott  Procedure(s) Performed: Procedure(s): COLONOSCOPY (N/A)  Patient Location: PACU and Endoscopy Unit  Anesthesia Type:General  Level of Consciousness: alert  and oriented  Airway & Oxygen Therapy: Patient Spontanous Breathing and Patient connected to nasal cannula oxygen  Post-op Assessment: Report given to RN  Post vital signs: Reviewed and stable  Last Vitals:  Filed Vitals:   01/27/15 0729  BP: 130/58  Pulse: 69  Temp: 36.7 C  Resp: 18    Complications: No apparent anesthesia complications

## 2015-01-28 LAB — SURGICAL PATHOLOGY

## 2015-02-01 ENCOUNTER — Telehealth: Payer: Self-pay | Admitting: *Deleted

## 2015-02-01 NOTE — Telephone Encounter (Signed)
-----   Message from Christene Lye, MD sent at 01/29/2015  6:48 AM EDT ----- Please let pt pt know the pathology was normal. Polyp had no cancer.

## 2015-02-01 NOTE — Telephone Encounter (Signed)
Patient notified as instructed and verbalizes understanding.  

## 2015-02-02 ENCOUNTER — Other Ambulatory Visit: Payer: Self-pay | Admitting: Family Medicine

## 2015-02-02 DIAGNOSIS — J45909 Unspecified asthma, uncomplicated: Secondary | ICD-10-CM | POA: Insufficient documentation

## 2015-04-07 ENCOUNTER — Other Ambulatory Visit: Payer: Self-pay | Admitting: Family Medicine

## 2015-04-07 DIAGNOSIS — M26609 Unspecified temporomandibular joint disorder, unspecified side: Secondary | ICD-10-CM

## 2015-04-07 DIAGNOSIS — J45909 Unspecified asthma, uncomplicated: Secondary | ICD-10-CM

## 2015-04-07 DIAGNOSIS — J309 Allergic rhinitis, unspecified: Secondary | ICD-10-CM | POA: Insufficient documentation

## 2015-04-07 DIAGNOSIS — M26659 Arthropathy of unspecified temporomandibular joint: Secondary | ICD-10-CM | POA: Insufficient documentation

## 2015-04-07 DIAGNOSIS — R252 Cramp and spasm: Secondary | ICD-10-CM | POA: Insufficient documentation

## 2015-04-07 DIAGNOSIS — N951 Menopausal and female climacteric states: Secondary | ICD-10-CM | POA: Insufficient documentation

## 2015-04-07 DIAGNOSIS — R590 Localized enlarged lymph nodes: Secondary | ICD-10-CM | POA: Insufficient documentation

## 2015-04-07 DIAGNOSIS — F419 Anxiety disorder, unspecified: Secondary | ICD-10-CM | POA: Insufficient documentation

## 2015-04-07 DIAGNOSIS — R0602 Shortness of breath: Secondary | ICD-10-CM | POA: Insufficient documentation

## 2015-04-07 NOTE — Telephone Encounter (Signed)
Last OV 11/2014  Thanks,   -Laura  

## 2015-04-16 ENCOUNTER — Other Ambulatory Visit: Payer: Self-pay | Admitting: Family Medicine

## 2015-04-16 DIAGNOSIS — N951 Menopausal and female climacteric states: Secondary | ICD-10-CM

## 2015-04-17 DIAGNOSIS — E039 Hypothyroidism, unspecified: Secondary | ICD-10-CM | POA: Insufficient documentation

## 2015-04-17 DIAGNOSIS — R030 Elevated blood-pressure reading, without diagnosis of hypertension: Secondary | ICD-10-CM | POA: Insufficient documentation

## 2015-04-17 DIAGNOSIS — M1990 Unspecified osteoarthritis, unspecified site: Secondary | ICD-10-CM | POA: Insufficient documentation

## 2015-04-17 DIAGNOSIS — Z833 Family history of diabetes mellitus: Secondary | ICD-10-CM | POA: Insufficient documentation

## 2015-04-17 DIAGNOSIS — K219 Gastro-esophageal reflux disease without esophagitis: Secondary | ICD-10-CM | POA: Insufficient documentation

## 2015-04-20 ENCOUNTER — Other Ambulatory Visit: Payer: Self-pay | Admitting: Family Medicine

## 2015-04-20 DIAGNOSIS — N951 Menopausal and female climacteric states: Secondary | ICD-10-CM

## 2015-04-24 ENCOUNTER — Ambulatory Visit: Payer: BLUE CROSS/BLUE SHIELD

## 2015-04-24 ENCOUNTER — Ambulatory Visit: Payer: Self-pay

## 2015-04-24 DIAGNOSIS — Z23 Encounter for immunization: Secondary | ICD-10-CM

## 2015-05-28 ENCOUNTER — Encounter: Payer: Self-pay | Admitting: Family Medicine

## 2015-05-28 ENCOUNTER — Ambulatory Visit (INDEPENDENT_AMBULATORY_CARE_PROVIDER_SITE_OTHER): Payer: BLUE CROSS/BLUE SHIELD | Admitting: Family Medicine

## 2015-05-28 VITALS — BP 126/74 | HR 64 | Temp 97.9°F | Resp 16 | Ht 63.5 in | Wt 177.0 lb

## 2015-05-28 DIAGNOSIS — J309 Allergic rhinitis, unspecified: Secondary | ICD-10-CM

## 2015-05-28 DIAGNOSIS — E7800 Pure hypercholesterolemia, unspecified: Secondary | ICD-10-CM | POA: Diagnosis not present

## 2015-05-28 DIAGNOSIS — R319 Hematuria, unspecified: Secondary | ICD-10-CM | POA: Diagnosis not present

## 2015-05-28 DIAGNOSIS — Z0000 Encounter for general adult medical examination without abnormal findings: Secondary | ICD-10-CM

## 2015-05-28 DIAGNOSIS — Z13820 Encounter for screening for osteoporosis: Secondary | ICD-10-CM

## 2015-05-28 DIAGNOSIS — Z124 Encounter for screening for malignant neoplasm of cervix: Secondary | ICD-10-CM

## 2015-05-28 LAB — POCT URINALYSIS DIPSTICK
BILIRUBIN UA: NEGATIVE
GLUCOSE UA: NEGATIVE
Ketones, UA: NEGATIVE
Leukocytes, UA: NEGATIVE
Nitrite, UA: NEGATIVE
Protein, UA: NEGATIVE
SPEC GRAV UA: 1.025
Urobilinogen, UA: 0.2
pH, UA: 6

## 2015-05-28 NOTE — Progress Notes (Signed)
Patient ID: SKYLIA ANGELICA, female   DOB: 1946/02/02, 69 y.o.   MRN: [id number]         Patient: MENDY ALPERIN, Female    DOB: 09-18-1945, 69 y.o.   MRN: [id number] Visit Date: 05/28/2015  Today's Provider: Margarita Rana, MD   Chief Complaint  Patient presents with  . Annual Exam   Subjective:    Annual physical exam DIJONAE DURNIN is a 69 y.o. female who presents today for health maintenance and complete physical. She feels fairly well. She reports exercising some but she reports she stays very active. She reports she is sleeping well.   Chronic problems stable.    -----------------------------------------------------------------   Review of Systems  Constitutional: Negative.   HENT: Negative.   Eyes: Positive for itching. Negative for photophobia, pain, discharge, redness and visual disturbance.  Respiratory: Negative.   Cardiovascular: Negative.   Gastrointestinal: Positive for constipation. Negative for nausea, vomiting, abdominal pain, diarrhea, blood in stool, abdominal distention, anal bleeding and rectal pain.  Endocrine: Negative.   Genitourinary: Negative.   Musculoskeletal: Positive for myalgias, back pain, neck pain and neck stiffness.  Skin: Negative.   Allergic/Immunologic: Positive for environmental allergies.  Neurological: Negative.   Hematological: Negative.   Psychiatric/Behavioral: Negative.     Social History She  reports that she has never smoked. She has never used smokeless tobacco. She reports that she does not drink alcohol or use illicit drugs. Social History   Social History  . Marital Status: Married    Spouse Name: N/A  . Number of Children: 1  . Years of Education: H/S   Occupational History  . Retired    Social History Main Topics  . Smoking status: Never Smoker   . Smokeless tobacco: Never Used  . Alcohol Use: No  . Drug Use: No  . Sexual Activity: Not Asked   Other Topics Concern  . None   Social History  Narrative    Patient Active Problem List   Diagnosis Date Noted  . Acid reflux 04/17/2015  . Acquired hypothyroidism 04/17/2015  . Arthritis 04/17/2015  . Blood pressure elevated without history of HTN 04/17/2015  . Family history of diabetes mellitus 04/17/2015  . Allergic rhinitis 04/07/2015  . Anxiety 04/07/2015  . Arthropathy of temporomandibular joint 04/07/2015  . Breath shortness 04/07/2015  . Spasm 04/07/2015  . Climacteric 04/07/2015  . Adenopathy, cervical 04/07/2015  . Airway hyperreactivity 04/07/2015  . Asthma 02/02/2015  . Adult hypothyroidism 12/23/2013  . Fibrocystic breast disease 11/26/2012  . Bloodgood disease 11/26/2012    Past Surgical History  Procedure Laterality Date  . Breast mass excision Left 2011  . Dilation and curettage of uterus  2007  . Abdominal hysterectomy  1994  . Colonoscopy  2005    done by Dr. Jamal Collin  . Breast cyst aspiration Right 1997?    breast aspiration d/t infected milk gland  . Lipoma excision  2010    breast bone  . Tongue biopsy    . Colonoscopy N/A 01/27/2015    Procedure: COLONOSCOPY;  Surgeon: Christene Lye, MD;  Location: [facility] ENDOSCOPY;  Service: Endoscopy;  Laterality: N/A;  . Foot surgery Right     Family History  Family Status  Relation Status Death Age  . Mother Deceased 26  . Other Alive   . Father Deceased   . Sister Alive   . Brother Alive    Her family history includes Breast cancer in her other; Diabetes in her father; Healthy in  her brother and sister; Liver cancer in her mother; Lung cancer in her maternal uncle; Thyroid disease in her mother.    Allergies  Allergen Reactions  . Shellfish Allergy Other (See Comments)    Throat closes  . Sulfa Antibiotics Swelling  . Neosporin [Neomycin-Bacitracin Zn-Polymyx] Rash    Previous Medications   ASCORBIC ACID (VITAMIN C PO)    Take by mouth.   ASPIRIN 81 MG TABLET    Take 81 mg by mouth daily.   BECLOMETHASONE (BECONASE-AQ) 42 MCG/SPRAY  NASAL SPRAY    Place 2 sprays into the nose as directed. Dose is for each nostril.   CALCIUM-VITAMIN D (OSCAL 500/200 D-3) 500-200 MG-UNIT TABLET    Take by mouth.   CHLORZOXAZONE (PARAFON) 500 MG TABLET    Take 500 mg by mouth as needed for muscle spasms.   ESCITALOPRAM (LEXAPRO) 10 MG TABLET    Take 1 tablet (10 mg total) by mouth daily.   EST ESTROGENS-METHYLTEST HS 0.625-1.25 MG TABLET    TAKE ONE (1) TABLET EACH DAY   ESTRADIOL (ESTRACE PO)    Take by mouth as directed.   FISH OIL OIL    by Does not apply route as directed.   IPRATROPIUM-ALBUTEROL (COMBIVENT IN)    Inhale into the lungs as directed.   LEVOTHYROXINE (SYNTHROID, LEVOTHROID) 25 MCG TABLET    Take 1 tablet by mouth daily.   LORATADINE-PSEUDOEPHEDRINE (CLARITIN-D 12-HOUR) 5-120 MG PER TABLET    Take 1 tablet by mouth as needed for allergies.   MEPROBAMATE (EQUANIL) 400 MG TABLET    Take 400 mg by mouth as needed for pain.   METRONIDAZOLE (METROCREAM) 0.75 % CREAM    Apply 1 application topically daily.   MULTIPLE VITAMIN (MULTIVITAMIN) CAPSULE    Take 1 capsule by mouth daily.   SYMBICORT 160-4.5 MCG/ACT INHALER    USE 2 PUFFS TWICE DAILY   ZAFIRLUKAST (ACCOLATE) 20 MG TABLET    TAKE ONE TABLET TWICE DAILY    Patient Care Team: Margarita Rana, MD as PCP - General (Family Medicine) Seeplaputhur Robinette Haines, MD (General Surgery) Margarita Rana, MD as Referring Physician (Family Medicine)     Objective:   Vitals: BP 126/74 mmHg  Pulse 64  Temp(Src) 97.9 F (36.6 C) (Oral)  Resp 16  Ht 5' 3.5" (1.613 m)  Wt 177 lb (80.287 kg)  BMI 30.86 kg/m2   Physical Exam  Constitutional: She is oriented to person, place, and time. She appears well-developed and well-nourished. She is active.  HENT:  Head: Normocephalic and atraumatic.  Right Ear: Tympanic membrane, external ear and ear canal normal.  Left Ear: Tympanic membrane, external ear and ear canal normal.  Nose: Nose normal.  Mouth/Throat: Uvula is midline, oropharynx is  clear and moist and mucous membranes are normal.  Eyes: Conjunctivae, EOM and lids are normal. Pupils are equal, round, and reactive to light. Lids are everted and swept, no foreign bodies found.  Neck: Trachea normal and normal range of motion. Neck supple. Carotid bruit is not present.  Cardiovascular: Normal rate, regular rhythm, normal heart sounds and normal pulses.   Pulmonary/Chest: Effort normal and breath sounds normal. Right breast exhibits no inverted nipple, no mass, no nipple discharge, no skin change and no tenderness. Left breast exhibits no inverted nipple, no mass, no nipple discharge, no skin change and no tenderness. Breasts are symmetrical.  Abdominal: Soft. Normal appearance, normal aorta and bowel sounds are normal. There is no tenderness.  Musculoskeletal: Normal range of motion.  Lymphadenopathy:    She has no cervical adenopathy.    She has no axillary adenopathy.  Neurological: She is alert and oriented to person, place, and time.  Skin: Skin is warm, dry and intact.  Psychiatric: She has a normal mood and affect. Her behavior is normal. Judgment and thought content normal.     Depression Screen PHQ 2/9 Scores 05/28/2015  PHQ - 2 Score 0      Assessment & Plan:     Routine Health Maintenance and Physical Exam  Exercise Activities and Dietary recommendations Goals    None      Immunization History  Administered Date(s) Administered  . Influenza, High Dose Seasonal PF 04/26/2015  . Pneumococcal Conjugate-13 05/22/2014  . Pneumococcal Polysaccharide-23 05/20/2012  . Td 03/14/2004  . Tdap 04/21/2011  . Zoster 12/02/2012    Health Maintenance  Topic Date Due  . Hepatitis C Screening  1945-09-06  . DEXA SCAN  07/10/2011  . INFLUENZA VACCINE  02/15/2016  . MAMMOGRAM  10/28/2016  . TETANUS/TDAP  04/20/2021  . COLONOSCOPY  01/26/2025  . ZOSTAVAX  Completed  . PNA vac Low Risk Adult  Completed      Discussed health benefits of physical  activity, and encouraged her to engage in regular exercise appropriate for her age and condition.   1. Annual physical exam As above.   2. Pap smear for cervical cancer screening Done today.   - Pap IG and HPV (high risk) DNA detection  3. Hematuria Will send to lab.  - POCT urinalysis dipstick - Urinalysis, Routine w reflex microscopic Results for orders placed or performed in visit on 05/28/15  Urinalysis, Routine w reflex microscopic  Result Value Ref Range   Specific Gravity, UA 1.019 1.005 - 1.030   pH, UA 6.0 5.0 - 7.5   Color, UA Yellow Yellow   Appearance Ur Clear Clear   Leukocytes, UA Negative Negative   Protein, UA Negative Negative/Trace   Glucose, UA Negative Negative   Ketones, UA Negative Negative   RBC, UA Negative Negative   Bilirubin, UA Negative Negative   Urobilinogen, Ur 0.2 0.2 - 1.0 mg/dL   Nitrite, UA Negative Negative   Microscopic Examination Comment   POCT urinalysis dipstick  Result Value Ref Range   Color, UA Yellow    Clarity, UA Clear    Glucose, UA Negative    Bilirubin, UA Negative    Ketones, UA Negative    Spec Grav, UA 1.025    Blood, UA Small    pH, UA 6.0    Protein, UA Negative    Urobilinogen, UA 0.2    Nitrite, UA Negative    Leukocytes, UA Negative Negative    4. Screening for osteoporosis Will schedule.  - DG Bone Density  5. Allergic rhinitis, unspecified allergic rhinitis type Stable. Check labs.  - CBC with Differential/Platelet  6. Elevated LDL cholesterol level Will check labs.  - Comprehensive metabolic panel - Lipid panel   Patient was seen and examined by Jerrell Belfast, MD, and note scribed by Ashley Royalty, CMA.  I have reviewed the document for accuracy and completeness and I agree with above. Jerrell Belfast, MD   Margarita Rana, MD    --------------------------------------------------------------------

## 2015-05-29 LAB — URINALYSIS, ROUTINE W REFLEX MICROSCOPIC
BILIRUBIN UA: NEGATIVE
Glucose, UA: NEGATIVE
Ketones, UA: NEGATIVE
Leukocytes, UA: NEGATIVE
Nitrite, UA: NEGATIVE
PROTEIN UA: NEGATIVE
RBC UA: NEGATIVE
Specific Gravity, UA: 1.019 (ref 1.005–1.030)
UUROB: 0.2 mg/dL (ref 0.2–1.0)
pH, UA: 6 (ref 5.0–7.5)

## 2015-06-02 ENCOUNTER — Telehealth: Payer: Self-pay

## 2015-06-02 LAB — PAP IG AND HPV HIGH-RISK
HPV, HIGH-RISK: NEGATIVE
PAP Smear Comment: 0

## 2015-06-02 NOTE — Telephone Encounter (Signed)
Pt is returning call.  [phone]

## 2015-06-02 NOTE — Telephone Encounter (Signed)
Pt advised.   Thanks,   -Morganna Styles  

## 2015-06-02 NOTE — Telephone Encounter (Signed)
LMTCB 06/02/2015  Thanks,   -Mickel Baas

## 2015-06-02 NOTE — Telephone Encounter (Signed)
-----   Message from Margarita Rana, MD sent at 06/02/2015  7:06 AM EST ----- Pap is normal. Please notify patient.   Thanks.

## 2015-06-04 ENCOUNTER — Encounter: Payer: Self-pay | Admitting: Family Medicine

## 2015-06-07 ENCOUNTER — Other Ambulatory Visit: Payer: Self-pay | Admitting: Family Medicine

## 2015-06-29 ENCOUNTER — Telehealth: Payer: Self-pay

## 2015-06-29 NOTE — Telephone Encounter (Signed)
FYI- Pt called concerned because she did not receive results from labs or BMD from November. Pt had labs drawn at [facility] where her husband is employed, and they had informed her of results. Advised pt that we did not receive labs or BMD results. Pt said she will call [facility] to inquire about those results. I called [facility], and they are faxing BMD results. Renaldo Fiddler, CMA

## 2015-07-02 ENCOUNTER — Telehealth: Payer: Self-pay | Admitting: Family Medicine

## 2015-07-02 NOTE — Telephone Encounter (Signed)
Still do not see the results. She can bring in her labs for [place] to copy next time she comes in. Thanks.

## 2015-07-02 NOTE — Telephone Encounter (Signed)
Pt called saying she never heard back regarding her labs from [place] from her physical in november.  They were done at [facility] and she did get the results but she wants to make sure that you have there results too.     Her call back is  [phone]  Thanks Con Memos

## 2015-07-02 NOTE — Telephone Encounter (Signed)
Pt advised.   Thanks,   -Bonni Neuser  

## 2015-07-29 ENCOUNTER — Telehealth: Payer: Self-pay | Admitting: Family Medicine

## 2015-07-29 NOTE — Telephone Encounter (Signed)
Scheduled pt for 10:45 07/30/15. Thanks TNP

## 2015-07-29 NOTE — Telephone Encounter (Signed)
Ok to put in same day. Thanks.   

## 2015-07-29 NOTE — Telephone Encounter (Signed)
Pt stated that she has been coughing, has temp of 99, and has congestion. Pt would like to come in tomorrow and only wants to see Dr. Venia Minks. Can I schedule her in a same day slot for 07/30/15? Thanks TNP

## 2015-07-30 ENCOUNTER — Ambulatory Visit (INDEPENDENT_AMBULATORY_CARE_PROVIDER_SITE_OTHER): Payer: BLUE CROSS/BLUE SHIELD | Admitting: Family Medicine

## 2015-07-30 ENCOUNTER — Encounter: Payer: Self-pay | Admitting: Family Medicine

## 2015-07-30 VITALS — BP 124/72 | HR 68 | Temp 98.5°F | Resp 16 | Wt 177.0 lb

## 2015-07-30 DIAGNOSIS — J45909 Unspecified asthma, uncomplicated: Secondary | ICD-10-CM | POA: Diagnosis not present

## 2015-07-30 DIAGNOSIS — Z780 Asymptomatic menopausal state: Secondary | ICD-10-CM

## 2015-07-30 DIAGNOSIS — J45901 Unspecified asthma with (acute) exacerbation: Secondary | ICD-10-CM

## 2015-07-30 DIAGNOSIS — J069 Acute upper respiratory infection, unspecified: Secondary | ICD-10-CM | POA: Diagnosis not present

## 2015-07-30 MED ORDER — AZITHROMYCIN 250 MG PO TABS: ORAL_TABLET | ORAL | Status: DC

## 2015-07-30 MED ORDER — PREDNISONE 10 MG PO TABS: 10.0000 mg | ORAL_TABLET | Freq: Every day | ORAL | Status: DC

## 2015-07-30 MED ORDER — ESTRADIOL 1 MG PO TABS: 1.0000 mg | ORAL_TABLET | Freq: Every day | ORAL | Status: DC

## 2015-07-30 NOTE — Progress Notes (Signed)
Patient ID: [name], female   DOB: 08/09/1945, 70 y.o.   MRN: [id number]         Patient: Meredith Scott Female    DOB: [birth year]   70 y.o.   MRN: [id number] Visit Date: 07/30/2015  Today's Provider: Margarita Rana, MD   Chief Complaint  Patient presents with  . URI  . Asthma   Subjective:    URI  This is a new problem. The current episode started 1 to 4 weeks ago. The problem has been gradually improving. The fever has been present for 1 to 2 days (Low grade fever for two days.). Associated symptoms include congestion, coughing, rhinorrhea, sneezing and wheezing (Worse at night.). Pertinent negatives include no chest pain, ear pain, headaches or sore throat. She has tried inhaler use for the symptoms.  Asthma She complains of cough, shortness of breath, sputum production and wheezing (Worse at night.). The cough is productive of sputum. Associated symptoms include ear congestion, a fever (Had a low grade fever 99.6 Monday and Tuesday), myalgias, nasal congestion, postnasal drip, rhinorrhea and sneezing. Pertinent negatives include no appetite change, chest pain, ear pain, headaches, sore throat or trouble swallowing. Her symptoms are aggravated by URI. Her past medical history is significant for asthma.   Also, patient has been on Estratest for years. Was started for low libido and menopausal symptoms and recurrent UTIs.  Still has low libido but has helped her UTIs.   Has tapered down to one several times a week.     Allergies  Allergen Reactions  . Shellfish Allergy Other (See Comments)    Throat closes  . Sulfa Antibiotics Swelling  . Neosporin [Neomycin-Bacitracin Zn-Polymyx] Rash   Previous Medications   ASCORBIC ACID (VITAMIN C PO)    Take by mouth.   ASPIRIN 81 MG TABLET    Take 81 mg by mouth daily.   BECLOMETHASONE (BECONASE-AQ) 42 MCG/SPRAY NASAL SPRAY    Place 2 sprays into the nose as directed. Dose is for each nostril.   CALCIUM-VITAMIN D (OSCAL 500/200  D-3) 500-200 MG-UNIT TABLET    Take by mouth.   CHLORZOXAZONE (PARAFON) 500 MG TABLET    Take 500 mg by mouth as needed for muscle spasms.   ESCITALOPRAM (LEXAPRO) 10 MG TABLET    TAKE 1 TABLET BY MOUTH ONCE DAILY   EST ESTROGENS-METHYLTEST HS 0.625-1.25 MG TABLET    TAKE ONE (1) TABLET EACH DAY   FISH OIL OIL    by Does not apply route as directed.   IPRATROPIUM-ALBUTEROL (COMBIVENT IN)    Inhale into the lungs as directed.   LEVOTHYROXINE (SYNTHROID, LEVOTHROID) 25 MCG TABLET    Take 1 tablet by mouth daily.   LORATADINE-PSEUDOEPHEDRINE (CLARITIN-D 12-HOUR) 5-120 MG PER TABLET    Take 1 tablet by mouth as needed for allergies.   MEPROBAMATE (EQUANIL) 400 MG TABLET    Take 400 mg by mouth as needed for pain.   METRONIDAZOLE (METROCREAM) 0.75 % CREAM    Apply 1 application topically daily.   MULTIPLE VITAMIN (MULTIVITAMIN) CAPSULE    Take 1 capsule by mouth daily.   SYMBICORT 160-4.5 MCG/ACT INHALER    USE 2 PUFFS TWICE DAILY   ZAFIRLUKAST (ACCOLATE) 20 MG TABLET    TAKE ONE TABLET TWICE DAILY    Review of Systems  Constitutional: Positive for fever (Had a low grade fever 99.6 Monday and Tuesday) and fatigue. Negative for chills, diaphoresis, activity change, appetite change and unexpected weight change.  HENT: Positive for  congestion, postnasal drip, rhinorrhea, sneezing and voice change. Negative for ear discharge, ear pain, nosebleeds, sinus pressure, sore throat, tinnitus and trouble swallowing.   Eyes: Positive for itching. Negative for photophobia, pain, discharge, redness and visual disturbance.  Respiratory: Positive for cough, sputum production, chest tightness, shortness of breath and wheezing (Worse at night.). Negative for apnea, choking and stridor.   Cardiovascular: Negative for chest pain, palpitations and leg swelling.  Gastrointestinal: Negative.   Musculoskeletal: Positive for myalgias.  Neurological: Negative for dizziness, light-headedness and headaches.    Social  History  Substance Use Topics  . Smoking status: Never Smoker   . Smokeless tobacco: Never Used  . Alcohol Use: No   Objective:   BP 124/72 mmHg  Pulse 68  Temp(Src) 98.5 F (36.9 C) (Oral)  Resp 16  Wt 177 lb (80.287 kg)  Physical Exam  Constitutional: She appears well-developed and well-nourished.  HENT:  Head: Normocephalic and atraumatic.  Right Ear: External ear normal.  Left Ear: External ear normal.  Nose: Nose normal.  Mouth/Throat: Oropharynx is clear and moist.  Cardiovascular: Normal rate, regular rhythm and normal heart sounds.   Pulmonary/Chest: Effort normal and breath sounds normal.  Psychiatric: She has a normal mood and affect. Her speech is normal and behavior is normal. Judgment and thought content normal. Cognition and memory are normal.        Assessment & Plan:      1. Upper respiratory infection Suspect viral, pt reports improving some.  Pt has a history of Asthma, will give RX for Prednisone and Zpak in case she worsening over the weekend.  Call and let us know if she does take the prescriptions.   - azithromycin (ZITHROMAX) 250 MG tablet; 2 tablets for one day, then one everyday until gone.  Dispense: 6 tablet; Refill: 0  2. Asthma, unspecified asthma severity, uncomplicated Treat as above.  3. Asthma exacerbation Treat as above. - predniSONE (DELTASONE) 10 MG tablet; Take 1 tablet (10 mg total) by mouth daily with breakfast. 6 tablets for one day; 5 tablets for one day; 4 tablets for one day; 3 tablets for one day; 2 tablets for one day; 1 tablets for one day.  Dispense: 21 tablet; Refill: 0 - azithromycin (ZITHROMAX) 250 MG tablet; 2 tablets for one day, then one everyday until gone.  Dispense: 6 tablet; Refill: 0  4. Postmenopausal Insurance is no longer covering Estratest will change to Estradiol.  - estradiol (ESTRACE) 1 MG tablet; Take 1 tablet (1 mg total) by mouth daily.  Dispense: 90 tablet; Refill: 1     Patient was seen and examined  by Jerrell Belfast, MD, and note scribed by Ashley Royalty, CMA.  I have reviewed the document for accuracy and completeness and I agree with above. - Jerrell Belfast, MD   Margarita Rana, MD [facility]

## 2015-08-03 ENCOUNTER — Encounter: Payer: Self-pay | Admitting: Family Medicine

## 2015-09-07 ENCOUNTER — Encounter: Payer: Self-pay | Admitting: *Deleted

## 2015-09-08 ENCOUNTER — Encounter: Payer: Self-pay | Admitting: Family Medicine

## 2015-10-26 ENCOUNTER — Other Ambulatory Visit: Payer: Self-pay

## 2015-10-26 DIAGNOSIS — Z780 Asymptomatic menopausal state: Secondary | ICD-10-CM

## 2015-10-26 DIAGNOSIS — N951 Menopausal and female climacteric states: Secondary | ICD-10-CM

## 2015-10-26 DIAGNOSIS — J45909 Unspecified asthma, uncomplicated: Secondary | ICD-10-CM

## 2015-10-26 MED ORDER — BUDESONIDE-FORMOTEROL FUMARATE 160-4.5 MCG/ACT IN AERO: 2.0000 | INHALATION_SPRAY | Freq: Two times a day (BID) | RESPIRATORY_TRACT | Status: DC

## 2015-10-26 MED ORDER — IPRATROPIUM-ALBUTEROL 20-100 MCG/ACT IN AERS: 1.0000 | INHALATION_SPRAY | RESPIRATORY_TRACT | Status: DC | PRN

## 2015-10-26 MED ORDER — ESTRADIOL 1 MG PO TABS: 1.0000 mg | ORAL_TABLET | Freq: Every day | ORAL | Status: DC

## 2015-10-26 MED ORDER — EST ESTROGENS-METHYLTEST 0.625-1.25 MG PO TABS: ORAL_TABLET | ORAL | Status: DC

## 2015-10-26 MED ORDER — BECLOMETHASONE DIPROP MONOHYD 42 MCG/SPRAY NA SUSP: 2.0000 | Freq: Every day | NASAL | Status: DC

## 2015-10-26 MED ORDER — ESCITALOPRAM OXALATE 10 MG PO TABS: 10.0000 mg | ORAL_TABLET | Freq: Every day | ORAL | Status: DC

## 2015-10-26 NOTE — Telephone Encounter (Signed)
Fax from [facility] for refill- EEMT HS 0.625-1.25-note states from pharmacist that patient is in transition between this medication and estradiol.-aa

## 2015-12-03 ENCOUNTER — Encounter: Payer: Self-pay | Admitting: Physician Assistant

## 2015-12-03 ENCOUNTER — Ambulatory Visit (INDEPENDENT_AMBULATORY_CARE_PROVIDER_SITE_OTHER): Payer: BLUE CROSS/BLUE SHIELD | Admitting: Physician Assistant

## 2015-12-03 VITALS — BP 142/82 | HR 68 | Temp 98.0°F | Resp 16 | Wt 177.4 lb

## 2015-12-03 DIAGNOSIS — J069 Acute upper respiratory infection, unspecified: Secondary | ICD-10-CM

## 2015-12-03 MED ORDER — AZITHROMYCIN 250 MG PO TABS: ORAL_TABLET | ORAL | Status: DC

## 2015-12-03 NOTE — Progress Notes (Signed)
Patient: Meredith Scott Female    DOB: [birth year]   70 y.o.   MRN: [id number] Visit Date: 12/03/2015  Today's Provider: Mar Daring, PA-C   Chief Complaint  Patient presents with  . URI   Subjective:    URI  This is a new problem. The current episode started in the past 7 days. Associated symptoms include congestion, coughing, ear pain, headaches and wheezing. Pertinent negatives include no abdominal pain, chest pain, nausea, rhinorrhea or vomiting. She has tried inhaler use, decongestant and antihistamine (Delsym and inhaler( did not use her inhaler this morning)) for the symptoms. The treatment provided mild relief.   Had been around people at a graduation that was out of town. Not known if any were sick.    Allergies  Allergen Reactions  . Shellfish Allergy Other (See Comments)    Throat closes  . Sulfa Antibiotics Swelling  . Neosporin [Neomycin-Bacitracin Zn-Polymyx] Rash   Previous Medications   ASCORBIC ACID (VITAMIN C PO)    Take by mouth.   ASPIRIN 81 MG TABLET    Take 81 mg by mouth daily.   AZITHROMYCIN (ZITHROMAX) 250 MG TABLET    2 tablets for one day, then one everyday until gone.   BECLOMETHASONE (BECONASE-AQ) 42 MCG/SPRAY NASAL SPRAY    Place 2 sprays into both nostrils daily. Dose is for each nostril.   BUDESONIDE-FORMOTEROL (SYMBICORT) 160-4.5 MCG/ACT INHALER    Inhale 2 puffs into the lungs 2 (two) times daily.   CALCIUM-VITAMIN D (OSCAL 500/200 D-3) 500-200 MG-UNIT TABLET    Take by mouth.   CHLORZOXAZONE (PARAFON) 500 MG TABLET    Take 500 mg by mouth as needed for muscle spasms. Reported on 12/03/2015   ESCITALOPRAM (LEXAPRO) 10 MG TABLET    Take 1 tablet (10 mg total) by mouth daily.   ESTRADIOL (ESTRACE) 1 MG TABLET    Take 1 tablet (1 mg total) by mouth daily.   ESTROGEN-METHYLTESTOSTERONE (EST ESTROGENS-METHYLTEST HS) 0.625-1.25 MG TABLET    TAKE ONE (1) TABLET EACH DAY   FISH OIL OIL    by Does not apply route as directed.   IPRATROPIUM-ALBUTEROL (COMBIVENT RESPIMAT) 20-100 MCG/ACT AERS RESPIMAT    Inhale 1 puff into the lungs every 4 (four) hours as needed for wheezing.   LEVOTHYROXINE (SYNTHROID, LEVOTHROID) 25 MCG TABLET    Take 1 tablet by mouth daily.   LORATADINE-PSEUDOEPHEDRINE (CLARITIN-D 12-HOUR) 5-120 MG PER TABLET    Take 1 tablet by mouth as needed for allergies.   MEPROBAMATE (EQUANIL) 400 MG TABLET    Take 400 mg by mouth as needed for pain.   METRONIDAZOLE (METROCREAM) 0.75 % CREAM    Apply 1 application topically daily.   MULTIPLE VITAMIN (MULTIVITAMIN) CAPSULE    Take 1 capsule by mouth daily.   PREDNISONE (DELTASONE) 10 MG TABLET    Take 1 tablet (10 mg total) by mouth daily with breakfast. 6 tablets for one day; 5 tablets for one day; 4 tablets for one day; 3 tablets for one day; 2 tablets for one day; 1 tablets for one day.   ZAFIRLUKAST (ACCOLATE) 20 MG TABLET    TAKE ONE TABLET TWICE DAILY    Review of Systems  Constitutional: Positive for fatigue. Negative for fever and chills.  HENT: Positive for congestion, ear pain, postnasal drip and voice change. Negative for rhinorrhea.   Respiratory: Positive for cough, chest tightness, shortness of breath and wheezing.   Cardiovascular: Negative for chest pain, palpitations  and leg swelling.  Gastrointestinal: Negative for nausea, vomiting and abdominal pain.  Neurological: Positive for headaches. Negative for dizziness.    Social History  Substance Use Topics  . Smoking status: Never Smoker   . Smokeless tobacco: Never Used  . Alcohol Use: No   Objective:   BP 142/82 mmHg  Pulse 68  Temp(Src) 98 F (36.7 C) (Oral)  Resp 16  Wt 177 lb 6.4 oz (80.468 kg)  SpO2 97%  Physical Exam  Constitutional: She appears well-developed and well-nourished. No distress.  HENT:  Head: Normocephalic and atraumatic.  Right Ear: Hearing, tympanic membrane, external ear and ear canal normal.  Left Ear: Hearing, tympanic membrane, external ear and ear canal  normal.  Nose: Mucosal edema and rhinorrhea present. Right sinus exhibits no maxillary sinus tenderness and no frontal sinus tenderness. Left sinus exhibits no maxillary sinus tenderness and no frontal sinus tenderness.  Mouth/Throat: Uvula is midline, oropharynx is clear and moist and mucous membranes are normal. No oropharyngeal exudate, posterior oropharyngeal edema or posterior oropharyngeal erythema.  Eyes: Conjunctivae are normal. Pupils are equal, round, and reactive to light. Right eye exhibits no discharge. Left eye exhibits no discharge. No scleral icterus.  Neck: Normal range of motion. Neck supple. No tracheal deviation present. No thyromegaly present.  Cardiovascular: Normal rate, regular rhythm and normal heart sounds.  Exam reveals no gallop and no friction rub.   No murmur heard. Pulmonary/Chest: Effort normal and breath sounds normal. No stridor. No respiratory distress. She has no wheezes. She has no rales.  Lymphadenopathy:    She has no cervical adenopathy.  Skin: Skin is warm and dry. She is not diaphoretic.  Vitals reviewed.       Assessment & Plan:     1. Upper respiratory infection Worsening symptoms that have not responded to OTC medications. Will treat with zpak as below. Continue all medications for asthma and allergies. Stay well hydrated and get plenty of rest. Call if symptoms fail to improve or worsen. - azithromycin (ZITHROMAX) 250 MG tablet; 2 tablets for one day, then one everyday until gone.  Dispense: 6 tablet; Refill: 0  The entirety of the information documented in the History of Present Illness, Review of Systems and Physical Exam were personally obtained by me. Portions of this information were initially documented by Lyndel Pleasure, CMA and reviewed by me for thoroughness and accuracy.       Mar Daring, PA-C  [facility]

## 2015-12-03 NOTE — Patient Instructions (Signed)
Upper Respiratory Infection, Adult Most upper respiratory infections (URIs) are a viral infection of the air passages leading to the lungs. A URI affects the nose, throat, and upper air passages. The most common type of URI is nasopharyngitis and is typically referred to as "the common cold." URIs run their course and usually go away on their own. Most of the time, a URI does not require medical attention, but sometimes a bacterial infection in the upper airways can follow a viral infection. This is called a secondary infection. Sinus and middle ear infections are common types of secondary upper respiratory infections. Bacterial pneumonia can also complicate a URI. A URI can worsen asthma and chronic obstructive pulmonary disease (COPD). Sometimes, these complications can require emergency medical care and may be life threatening.  CAUSES Almost all URIs are caused by viruses. A virus is a type of germ and can spread from one person to another.  RISKS FACTORS You may be at risk for a URI if:   You smoke.   You have chronic heart or lung disease.  You have a weakened defense (immune) system.   You are very young or very old.   You have nasal allergies or asthma.  You work in crowded or poorly ventilated areas.  You work in health care facilities or schools. SIGNS AND SYMPTOMS  Symptoms typically develop 2-3 days after you come in contact with a cold virus. Most viral URIs last 7-10 days. However, viral URIs from the influenza virus (flu virus) can last 14-18 days and are typically more severe. Symptoms may include:   Runny or stuffy (congested) nose.   Sneezing.   Cough.   Sore throat.   Headache.   Fatigue.   Fever.   Loss of appetite.   Pain in your forehead, behind your eyes, and over your cheekbones (sinus pain).  Muscle aches.  DIAGNOSIS  Your health care provider may diagnose a URI by:  Physical exam.  Tests to check that your symptoms are not due to  another condition such as:  Strep throat.  Sinusitis.  Pneumonia.  Asthma. TREATMENT  A URI goes away on its own with time. It cannot be cured with medicines, but medicines may be prescribed or recommended to relieve symptoms. Medicines may help:  Reduce your fever.  Reduce your cough.  Relieve nasal congestion. HOME CARE INSTRUCTIONS   Take medicines only as directed by your health care provider.   Gargle warm saltwater or take cough drops to comfort your throat as directed by your health care provider.  Use a warm mist humidifier or inhale steam from a shower to increase air moisture. This may make it easier to breathe.  Drink enough fluid to keep your urine clear or pale yellow.   Eat soups and other clear broths and maintain good nutrition.   Rest as needed.   Return to work when your temperature has returned to normal or as your health care provider advises. You may need to stay home longer to avoid infecting others. You can also use a face mask and careful hand washing to prevent spread of the virus.  Increase the usage of your inhaler if you have asthma.   Do not use any tobacco products, including cigarettes, chewing tobacco, or electronic cigarettes. If you need help quitting, ask your health care provider. PREVENTION  The best way to protect yourself from getting a cold is to practice good hygiene.   Avoid oral or hand contact with people with cold   symptoms.   Wash your hands often if contact occurs.  There is no clear evidence that vitamin C, vitamin E, echinacea, or exercise reduces the chance of developing a cold. However, it is always recommended to get plenty of rest, exercise, and practice good nutrition.  SEEK MEDICAL CARE IF:   You are getting worse rather than better.   Your symptoms are not controlled by medicine.   You have chills.  You have worsening shortness of breath.  You have brown or red mucus.  You have yellow or brown nasal  discharge.  You have pain in your face, especially when you bend forward.  You have a fever.  You have swollen neck glands.  You have pain while swallowing.  You have white areas in the back of your throat. SEEK IMMEDIATE MEDICAL CARE IF:   You have severe or persistent:  Headache.  Ear pain.  Sinus pain.  Chest pain.  You have chronic lung disease and any of the following:  Wheezing.  Prolonged cough.  Coughing up blood.  A change in your usual mucus.  You have a stiff neck.  You have changes in your:  Vision.  Hearing.  Thinking.  Mood. MAKE SURE YOU:   Understand these instructions.  Will watch your condition.  Will get help right away if you are not doing well or get worse.   This information is not intended to replace advice given to you by your health care provider. Make sure you discuss any questions you have with your health care provider.   Document Released: 12/27/2000 Document Revised: 11/17/2014 Document Reviewed: 10/08/2013 Elsevier Interactive Patient Education 2016 [employer]  

## 2015-12-06 ENCOUNTER — Encounter: Payer: Self-pay | Admitting: General Surgery

## 2015-12-09 ENCOUNTER — Ambulatory Visit (INDEPENDENT_AMBULATORY_CARE_PROVIDER_SITE_OTHER): Payer: BLUE CROSS/BLUE SHIELD | Admitting: General Surgery

## 2015-12-09 ENCOUNTER — Encounter: Payer: Self-pay | Admitting: General Surgery

## 2015-12-09 VITALS — BP 130/68 | HR 76 | Resp 12 | Ht 63.5 in | Wt 177.0 lb

## 2015-12-09 DIAGNOSIS — Z87898 Personal history of other specified conditions: Secondary | ICD-10-CM

## 2015-12-09 NOTE — Progress Notes (Signed)
Patient ID: Meredith Scott, female   DOB: February 22, 1946, 70 y.o.   MRN: [id number]  Chief Complaint  Patient presents with  . Follow-up    mammogram    HPI Meredith Scott is a 70 y.o. female.  who presents for a breast evaluation. The most recent mammogram was done on 12-02-15 .  Patient does perform regular self breast checks and gets regular mammograms done.  No new breast issues. Recovering from an asthma flare. I have reviewed the history of present illness with the patient.  HPI  Past Medical History  Diagnosis Date  . Diffuse cystic mastopathy 2013  . Breast screening, unspecified 2013  . Special screening for malignant neoplasms, colon 2013  . Obesity, unspecified 2013  . Screening for obesity 2013  . Asymptomatic varicose veins 2011  . Asthma   . Hypertension   . Anxiety   . Hypothyroidism     Dr Eddie Dibbles    Past Surgical History  Procedure Laterality Date  . Breast mass excision Left 2011  . Dilation and curettage of uterus  2007  . Abdominal hysterectomy  1994  . Colonoscopy  2005    done by Dr. Jamal Collin  . Breast cyst aspiration Right 1997?    breast aspiration d/t infected milk gland  . Lipoma excision  2010    breast bone  . Tongue biopsy    . Colonoscopy N/A 01/27/2015    Procedure: COLONOSCOPY;  Surgeon: Christene Lye, MD;  Location: [facility] ENDOSCOPY;  Service: Endoscopy;  Laterality: N/A;  . Foot surgery Right     Family History  Problem Relation Age of Onset  . Liver cancer Mother   . Thyroid disease Mother   . Breast cancer Other   . Diabetes Father   . Lung cancer Maternal Uncle   . Healthy Sister   . Healthy Brother     Social History Social History  Substance Use Topics  . Smoking status: Never Smoker   . Smokeless tobacco: Never Used  . Alcohol Use: No    Allergies  Allergen Reactions  . Shellfish Allergy Other (See Comments)    Throat closes  . Sulfa Antibiotics Swelling  . Neosporin [Neomycin-Bacitracin Zn-Polymyx] Rash     Current Outpatient Prescriptions  Medication Sig Dispense Refill  . Ascorbic Acid (VITAMIN C PO) Take by mouth.    Marland Kitchen aspirin 81 MG tablet Take 81 mg by mouth daily.    . beclomethasone (BECONASE-AQ) 42 MCG/SPRAY nasal spray Place 2 sprays into both nostrils daily. Dose is for each nostril. 25 g 5  . budesonide-formoterol (SYMBICORT) 160-4.5 MCG/ACT inhaler Inhale 2 puffs into the lungs 2 (two) times daily. 10.2 g 5  . calcium-vitamin D (OSCAL 500/200 D-3) 500-200 MG-UNIT tablet Take by mouth.    . chlorzoxazone (PARAFON) 500 MG tablet Take 500 mg by mouth as needed for muscle spasms. Reported on 12/03/2015    . escitalopram (LEXAPRO) 10 MG tablet Take 1 tablet (10 mg total) by mouth daily. (Patient taking differently: Take 5 mg by mouth daily. ) 90 tablet 1  . estrogen-methylTESTOSTERone (EST ESTROGENS-METHYLTEST HS) 0.625-1.25 MG tablet TAKE ONE (1) TABLET EACH DAY 30 tablet 3  . Fish Oil OIL by Does not apply route as directed.    . Ipratropium-Albuterol (COMBIVENT RESPIMAT) 20-100 MCG/ACT AERS respimat Inhale 1 puff into the lungs every 4 (four) hours as needed for wheezing. 4 g 12  . levothyroxine (SYNTHROID, LEVOTHROID) 25 MCG tablet Take 1 tablet by mouth daily. 2 on  sunday    . loratadine-pseudoephedrine (CLARITIN-D 12-HOUR) 5-120 MG per tablet Take 1 tablet by mouth as needed for allergies.    . meprobamate (EQUANIL) 400 MG tablet Take 400 mg by mouth as needed for pain.    . metroNIDAZOLE (METROCREAM) 0.75 % cream Apply 1 application topically daily.    . Multiple Vitamin (MULTIVITAMIN) capsule Take 1 capsule by mouth daily.    . zafirlukast (ACCOLATE) 20 MG tablet TAKE ONE TABLET TWICE DAILY 60 tablet 5   No current facility-administered medications for this visit.    Review of Systems Review of Systems  Constitutional: Negative.   Respiratory: Positive for cough.   Cardiovascular: Negative.     Blood pressure 130/68, pulse 76, resp. rate 12, height 5' 3.5" (1.613 m),  weight 177 lb (80.287 kg).  Physical Exam Physical Exam  Constitutional: She is oriented to person, place, and time. She appears well-developed and well-nourished.  HENT:  Mouth/Throat: Oropharynx is clear and moist.  Eyes: Conjunctivae are normal. No scleral icterus.  Neck: Neck supple.  Cardiovascular: Normal rate, regular rhythm and normal heart sounds.   Pulmonary/Chest: Effort normal and breath sounds normal. Right breast exhibits no inverted nipple, no mass, no nipple discharge, no skin change and no tenderness. Left breast exhibits no inverted nipple, no mass, no nipple discharge, no skin change and no tenderness.  Abdominal: Soft. Normal appearance. There is no tenderness.  Lymphadenopathy:    She has no cervical adenopathy.    She has no axillary adenopathy.  Neurological: She is alert and oriented to person, place, and time.  Skin: Skin is warm.  Psychiatric: Her behavior is normal.    Data Reviewed Mammogram reviewed and stable.  Assessment    Stable exam. FCD.    Plan    Patient will be asked to return to the office in one year with a bilateral screening mammogram.       PCP:  Margarita Rana  This information has been scribed by Karie Fetch RN, BSN,BC.   SANKAR,SEEPLAPUTHUR G 12/09/2015, 11:33 AM

## 2015-12-09 NOTE — Patient Instructions (Addendum)
The patient is aware to call back for any questions or concerns. The patient has been asked to return to the office in one year with a bilateral scereening mammogram.

## 2016-04-22 ENCOUNTER — Ambulatory Visit (INDEPENDENT_AMBULATORY_CARE_PROVIDER_SITE_OTHER): Payer: BLUE CROSS/BLUE SHIELD

## 2016-04-22 DIAGNOSIS — Z23 Encounter for immunization: Secondary | ICD-10-CM

## 2016-04-25 ENCOUNTER — Encounter: Payer: Self-pay | Admitting: Physician Assistant

## 2016-04-25 ENCOUNTER — Ambulatory Visit (INDEPENDENT_AMBULATORY_CARE_PROVIDER_SITE_OTHER): Payer: BLUE CROSS/BLUE SHIELD | Admitting: Physician Assistant

## 2016-04-25 VITALS — BP 118/64 | HR 68 | Temp 97.9°F | Resp 16 | Wt 179.0 lb

## 2016-04-25 DIAGNOSIS — J45901 Unspecified asthma with (acute) exacerbation: Secondary | ICD-10-CM

## 2016-04-25 MED ORDER — PREDNISONE 20 MG PO TABS: ORAL_TABLET | ORAL | 0 refills | Status: DC

## 2016-04-25 MED ORDER — PREDNISONE 10 MG (21) PO TBPK: ORAL_TABLET | ORAL | 0 refills | Status: DC

## 2016-04-25 NOTE — Patient Instructions (Addendum)
Symbicort: This is your LONG ACTING inhaler. Use 2 puffs, 2 times daily. Combivent: This is your SHORT ACTING/RESCUE inhaler. Use one puff every four hours for three days, then daily as needed.    Asthma, Adult Asthma is a recurring condition in which the airways tighten and narrow. Asthma can make it difficult to breathe. It can cause coughing, wheezing, and shortness of breath. Asthma episodes, also called asthma attacks, range from minor to life-threatening. Asthma cannot be cured, but medicines and lifestyle changes can help control it. CAUSES Asthma is believed to be caused by inherited (genetic) and environmental factors, but its exact cause is unknown. Asthma may be triggered by allergens, lung infections, or irritants in the air. Asthma triggers are different for each person. Common triggers include:   Animal dander.  Dust mites.  Cockroaches.  Pollen from trees or grass.  Mold.  Smoke.  Air pollutants such as dust, household cleaners, hair sprays, aerosol sprays, paint fumes, strong chemicals, or strong odors.  Cold air, weather changes, and winds (which increase molds and pollens in the air).  Strong emotional expressions such as crying or laughing hard.  Stress.  Certain medicines (such as aspirin) or types of drugs (such as beta-blockers).  Sulfites in foods and drinks. Foods and drinks that may contain sulfites include dried fruit, potato chips, and sparkling grape juice.  Infections or inflammatory conditions such as the flu, a cold, or an inflammation of the nasal membranes (rhinitis).  Gastroesophageal reflux disease (GERD).  Exercise or strenuous activity. SYMPTOMS Symptoms may occur immediately after asthma is triggered or many hours later. Symptoms include:  Wheezing.  Excessive nighttime or early morning coughing.  Frequent or severe coughing with a common cold.  Chest tightness.  Shortness of breath. DIAGNOSIS  The diagnosis of asthma is made  by a review of your medical history and a physical exam. Tests may also be performed. These may include:  Lung function studies. These tests show how much air you breathe in and out.  Allergy tests.  Imaging tests such as X-rays. TREATMENT  Asthma cannot be cured, but it can usually be controlled. Treatment involves identifying and avoiding your asthma triggers. It also involves medicines. There are 2 classes of medicine used for asthma treatment:   Controller medicines. These prevent asthma symptoms from occurring. They are usually taken every day.  Reliever or rescue medicines. These quickly relieve asthma symptoms. They are used as needed and provide short-term relief. Your health care provider will help you create an asthma action plan. An asthma action plan is a written plan for managing and treating your asthma attacks. It includes a list of your asthma triggers and how they may be avoided. It also includes information on when medicines should be taken and when their dosage should be changed. An action plan may also involve the use of a device called a peak flow meter. A peak flow meter measures how well the lungs are working. It helps you monitor your condition. HOME CARE INSTRUCTIONS   Take medicines only as directed by your health care provider. Speak with your health care provider if you have questions about how or when to take the medicines.  Use a peak flow meter as directed by your health care provider. Record and keep track of readings.  Understand and use the action plan to help minimize or stop an asthma attack without needing to seek medical care.  Control your home environment in the following ways to help prevent asthma attacks:  Do not smoke. Avoid being exposed to secondhand smoke.  Change your heating and air conditioning filter regularly.  Limit your use of fireplaces and wood stoves.  Get rid of pests (such as roaches and mice) and their droppings.  Throw away  plants if you see mold on them.  Clean your floors and dust regularly. Use unscented cleaning products.  Try to have someone else vacuum for you regularly. Stay out of rooms while they are being vacuumed and for a short while afterward. If you vacuum, use a dust mask from a hardware store, a double-layered or microfilter vacuum cleaner bag, or a vacuum cleaner with a HEPA filter.  Replace carpet with wood, tile, or vinyl flooring. Carpet can trap dander and dust.  Use allergy-proof pillows, mattress covers, and box spring covers.  Wash bed sheets and blankets every week in hot water and dry them in a dryer.  Use blankets that are made of polyester or cotton.  Clean bathrooms and kitchens with bleach. If possible, have someone repaint the walls in these rooms with mold-resistant paint. Keep out of the rooms that are being cleaned and painted.  Wash hands frequently. SEEK MEDICAL CARE IF:   You have wheezing, shortness of breath, or a cough even if taking medicine to prevent attacks.  The colored mucus you cough up (sputum) is thicker than usual.  Your sputum changes from clear or white to yellow, green, gray, or bloody.  You have any problems that may be related to the medicines you are taking (such as a rash, itching, swelling, or trouble breathing).  You are using a reliever medicine more than 2-3 times per week.  Your peak flow is still at 50-79% of your personal best after following your action plan for 1 hour.  You have a fever. SEEK IMMEDIATE MEDICAL CARE IF:   You seem to be getting worse and are unresponsive to treatment during an asthma attack.  You are short of breath even at rest.  You get short of breath when doing very little physical activity.  You have difficulty eating, drinking, or talking due to asthma symptoms.  You develop chest pain.  You develop a fast heartbeat.  You have a bluish color to your lips or fingernails.  You are light-headed, dizzy, or  faint.  Your peak flow is less than 50% of your personal best.   This information is not intended to replace advice given to you by your health care provider. Make sure you discuss any questions you have with your health care provider.   Document Released: 07/03/2005 Document Revised: 03/24/2015 Document Reviewed: 01/30/2013 Elsevier Interactive Patient Education [employer].

## 2016-04-25 NOTE — Progress Notes (Signed)
Patient: Meredith Scott Female    DOB: 1945-08-09   70 y.o.   MRN: [id number] Visit Date: 04/25/2016  Today's Provider: Trinna Post, PA-C   Chief Complaint  Patient presents with  . Cough   Subjective:    Asthma  She complains of cough and shortness of breath. There is no wheezing. This is a chronic problem. The cough is productive. Associated symptoms include malaise/fatigue, postnasal drip and rhinorrhea. Pertinent negatives include no appetite change, chest pain, ear congestion, ear pain, fever, headaches, nasal congestion, sneezing, sore throat or trouble swallowing. Her past medical history is significant for asthma.   Patient reports coughing up some clear phlegm. Patient is going out of town this weekend and wanted to be seen before leaving. Patient received flu shot on Saturday.    Allergies  Allergen Reactions  . Shellfish Allergy Other (See Comments)    Throat closes  . Sulfa Antibiotics Swelling  . Neosporin [Neomycin-Bacitracin Zn-Polymyx] Rash     Current Outpatient Prescriptions:  .  Ascorbic Acid (VITAMIN C PO), Take by mouth., Disp: , Rfl:  .  aspirin 81 MG tablet, Take 81 mg by mouth daily., Disp: , Rfl:  .  beclomethasone (BECONASE-AQ) 42 MCG/SPRAY nasal spray, Place 2 sprays into both nostrils daily. Dose is for each nostril., Disp: 25 g, Rfl: 5 .  budesonide-formoterol (SYMBICORT) 160-4.5 MCG/ACT inhaler, Inhale 2 puffs into the lungs 2 (two) times daily., Disp: 10.2 g, Rfl: 5 .  calcium-vitamin D (OSCAL 500/200 D-3) 500-200 MG-UNIT tablet, Take by mouth., Disp: , Rfl:  .  chlorzoxazone (PARAFON) 500 MG tablet, Take 500 mg by mouth as needed for muscle spasms. Reported on 12/03/2015, Disp: , Rfl:  .  escitalopram (LEXAPRO) 10 MG tablet, Take 1 tablet (10 mg total) by mouth daily. (Patient taking differently: Take 5 mg by mouth daily. ), Disp: 90 tablet, Rfl: 1 .  estrogen-methylTESTOSTERone (EST ESTROGENS-METHYLTEST HS) 0.625-1.25 MG tablet, TAKE  ONE (1) TABLET EACH DAY, Disp: 30 tablet, Rfl: 3 .  Fish Oil OIL, by Does not apply route as directed., Disp: , Rfl:  .  Ipratropium-Albuterol (COMBIVENT RESPIMAT) 20-100 MCG/ACT AERS respimat, Inhale 1 puff into the lungs every 4 (four) hours as needed for wheezing., Disp: 4 g, Rfl: 12 .  levothyroxine (SYNTHROID, LEVOTHROID) 25 MCG tablet, Take 1 tablet by mouth daily. 2 on sunday, Disp: , Rfl:  .  loratadine (CLARITIN) 10 MG tablet, Take 10 mg by mouth daily., Disp: , Rfl:  .  meprobamate (EQUANIL) 400 MG tablet, Take 400 mg by mouth as needed for pain., Disp: , Rfl:  .  metroNIDAZOLE (METROCREAM) 0.75 % cream, Apply 1 application topically daily., Disp: , Rfl:  .  Multiple Vitamin (MULTIVITAMIN) capsule, Take 1 capsule by mouth daily., Disp: , Rfl:  .  zafirlukast (ACCOLATE) 20 MG tablet, TAKE ONE TABLET TWICE DAILY, Disp: 60 tablet, Rfl: 5 .  predniSONE (STERAPRED UNI-PAK 21 TAB) 10 MG (21) TBPK tablet, Take as directed on package instructions, Disp: 21 tablet, Rfl: 0  Review of Systems  Constitutional: Positive for malaise/fatigue. Negative for appetite change and fever.  HENT: Positive for congestion, postnasal drip, rhinorrhea and sinus pressure. Negative for ear discharge, ear pain, nosebleeds, sneezing, sore throat, tinnitus and trouble swallowing.   Eyes: Positive for itching. Negative for photophobia, pain, discharge, redness and visual disturbance.  Respiratory: Positive for cough, chest tightness and shortness of breath. Negative for apnea, choking, wheezing and stridor.   Cardiovascular: Negative.  Negative for chest pain.  Gastrointestinal: Negative.   Musculoskeletal: Negative.   Allergic/Immunologic: Positive for environmental allergies.  Neurological: Negative for dizziness, light-headedness and headaches.  Hematological: Does not bruise/bleed easily.    Social History  Substance Use Topics  . Smoking status: Never Smoker  . Smokeless tobacco: Never Used  . Alcohol use  No   Objective:   BP 118/64 (BP Location: Right Arm, Patient Position: Sitting, Cuff Size: Normal)   Pulse 68   Temp 97.9 F (36.6 C) (Oral)   Resp 16   Wt 179 lb (81.2 kg)   SpO2 96%   BMI 31.21 kg/m   Physical Exam  Constitutional: She is oriented to person, place, and time. She appears well-developed and well-nourished.  HENT:  Right Ear: External ear normal.  Left Ear: External ear normal.  Nose: Nose normal.  Mouth/Throat: Oropharynx is clear and moist. No oropharyngeal exudate.  Eyes: Conjunctivae are normal. Right eye exhibits no discharge. Left eye exhibits no discharge.  Neck: Neck supple.  Cardiovascular: Normal rate, regular rhythm and normal heart sounds.   Pulmonary/Chest: Breath sounds normal. No respiratory distress. She has no wheezes. She has no rales.  Lymphadenopathy:    She has no cervical adenopathy.  Neurological: She is alert and oriented to person, place, and time.  Skin: Skin is warm and dry.  Psychiatric: She has a normal mood and affect. Her behavior is normal.        Assessment & Plan:      Problem List Items Addressed This Visit    Asthma - Primary   Relevant Medications   predniSONE (STERAPRED UNI-PAK 21 TAB) 10 MG (21) TBPK tablet    Other Visit Diagnoses   None.    Asthma Exacerbation  Patient reports not using Symbicort consistently. Patient advised on role of long acting inhaler and she should use this 2 puffs twice daily as prescribed. Patient counseled on role of Combivent rescue inhaler. She should use this once every four hours for the next three days and then as needed after. Patient prescribed deltasone 21 tablet taper pack to be used if she does not feel improvement from these interventions during her trip.   Return if symptoms worsen or fail to improve.  The entirety of the information documented in the History of Present Illness, Review of Systems and Physical Exam were personally obtained by me. Portions of this information  were initially documented by Ashley Royalty, CMA and reviewed by me for thoroughness and accuracy.    Patient Instructions  Symbicort: This is your LONG ACTING inhaler. Use 2 puffs, 2 times daily. Combivent: This is your SHORT ACTING/RESCUE inhaler. Use one puff every four hours for three days, then daily as needed.    Asthma, Adult Asthma is a recurring condition in which the airways tighten and narrow. Asthma can make it difficult to breathe. It can cause coughing, wheezing, and shortness of breath. Asthma episodes, also called asthma attacks, range from minor to life-threatening. Asthma cannot be cured, but medicines and lifestyle changes can help control it. CAUSES Asthma is believed to be caused by inherited (genetic) and environmental factors, but its exact cause is unknown. Asthma may be triggered by allergens, lung infections, or irritants in the air. Asthma triggers are different for each person. Common triggers include:   Animal dander.  Dust mites.  Cockroaches.  Pollen from trees or grass.  Mold.  Smoke.  Air pollutants such as dust, household cleaners, hair sprays, aerosol sprays, paint fumes, strong  chemicals, or strong odors.  Cold air, weather changes, and winds (which increase molds and pollens in the air).  Strong emotional expressions such as crying or laughing hard.  Stress.  Certain medicines (such as aspirin) or types of drugs (such as beta-blockers).  Sulfites in foods and drinks. Foods and drinks that may contain sulfites include dried fruit, potato chips, and sparkling grape juice.  Infections or inflammatory conditions such as the flu, a cold, or an inflammation of the nasal membranes (rhinitis).  Gastroesophageal reflux disease (GERD).  Exercise or strenuous activity. SYMPTOMS Symptoms may occur immediately after asthma is triggered or many hours later. Symptoms include:  Wheezing.  Excessive nighttime or early morning coughing.  Frequent or  severe coughing with a common cold.  Chest tightness.  Shortness of breath. DIAGNOSIS  The diagnosis of asthma is made by a review of your medical history and a physical exam. Tests may also be performed. These may include:  Lung function studies. These tests show how much air you breathe in and out.  Allergy tests.  Imaging tests such as X-rays. TREATMENT  Asthma cannot be cured, but it can usually be controlled. Treatment involves identifying and avoiding your asthma triggers. It also involves medicines. There are 2 classes of medicine used for asthma treatment:   Controller medicines. These prevent asthma symptoms from occurring. They are usually taken every day.  Reliever or rescue medicines. These quickly relieve asthma symptoms. They are used as needed and provide short-term relief. Your health care provider will help you create an asthma action plan. An asthma action plan is a written plan for managing and treating your asthma attacks. It includes a list of your asthma triggers and how they may be avoided. It also includes information on when medicines should be taken and when their dosage should be changed. An action plan may also involve the use of a device called a peak flow meter. A peak flow meter measures how well the lungs are working. It helps you monitor your condition. HOME CARE INSTRUCTIONS   Take medicines only as directed by your health care provider. Speak with your health care provider if you have questions about how or when to take the medicines.  Use a peak flow meter as directed by your health care provider. Record and keep track of readings.  Understand and use the action plan to help minimize or stop an asthma attack without needing to seek medical care.  Control your home environment in the following ways to help prevent asthma attacks:  Do not smoke. Avoid being exposed to secondhand smoke.  Change your heating and air conditioning filter  regularly.  Limit your use of fireplaces and wood stoves.  Get rid of pests (such as roaches and mice) and their droppings.  Throw away plants if you see mold on them.  Clean your floors and dust regularly. Use unscented cleaning products.  Try to have someone else vacuum for you regularly. Stay out of rooms while they are being vacuumed and for a short while afterward. If you vacuum, use a dust mask from a hardware store, a double-layered or microfilter vacuum cleaner bag, or a vacuum cleaner with a HEPA filter.  Replace carpet with wood, tile, or vinyl flooring. Carpet can trap dander and dust.  Use allergy-proof pillows, mattress covers, and box spring covers.  Wash bed sheets and blankets every week in hot water and dry them in a dryer.  Use blankets that are made of polyester or cotton.  Clean bathrooms and kitchens with bleach. If possible, have someone repaint the walls in these rooms with mold-resistant paint. Keep out of the rooms that are being cleaned and painted.  Wash hands frequently. SEEK MEDICAL CARE IF:   You have wheezing, shortness of breath, or a cough even if taking medicine to prevent attacks.  The colored mucus you cough up (sputum) is thicker than usual.  Your sputum changes from clear or white to yellow, green, gray, or bloody.  You have any problems that may be related to the medicines you are taking (such as a rash, itching, swelling, or trouble breathing).  You are using a reliever medicine more than 2-3 times per week.  Your peak flow is still at 50-79% of your personal best after following your action plan for 1 hour.  You have a fever. SEEK IMMEDIATE MEDICAL CARE IF:   You seem to be getting worse and are unresponsive to treatment during an asthma attack.  You are short of breath even at rest.  You get short of breath when doing very little physical activity.  You have difficulty eating, drinking, or talking due to asthma symptoms.  You  develop chest pain.  You develop a fast heartbeat.  You have a bluish color to your lips or fingernails.  You are light-headed, dizzy, or faint.  Your peak flow is less than 50% of your personal best.   This information is not intended to replace advice given to you by your health care provider. Make sure you discuss any questions you have with your health care provider.   Document Released: 07/03/2005 Document Revised: 03/24/2015 Document Reviewed: 01/30/2013 Elsevier Interactive Patient Education 2016 [place], PA-C  [facility]

## 2016-05-31 ENCOUNTER — Ambulatory Visit (INDEPENDENT_AMBULATORY_CARE_PROVIDER_SITE_OTHER): Payer: BLUE CROSS/BLUE SHIELD | Admitting: Physician Assistant

## 2016-05-31 ENCOUNTER — Encounter: Payer: Self-pay | Admitting: Physician Assistant

## 2016-05-31 ENCOUNTER — Encounter: Payer: BLUE CROSS/BLUE SHIELD | Admitting: Family Medicine

## 2016-05-31 VITALS — BP 118/68 | HR 66 | Temp 97.8°F | Resp 16 | Ht 64.0 in | Wt 179.4 lb

## 2016-05-31 DIAGNOSIS — R3121 Asymptomatic microscopic hematuria: Secondary | ICD-10-CM

## 2016-05-31 DIAGNOSIS — E7800 Pure hypercholesterolemia, unspecified: Secondary | ICD-10-CM | POA: Diagnosis not present

## 2016-05-31 DIAGNOSIS — Z0000 Encounter for general adult medical examination without abnormal findings: Secondary | ICD-10-CM

## 2016-05-31 DIAGNOSIS — Z1239 Encounter for other screening for malignant neoplasm of breast: Secondary | ICD-10-CM

## 2016-05-31 DIAGNOSIS — Z1231 Encounter for screening mammogram for malignant neoplasm of breast: Secondary | ICD-10-CM

## 2016-05-31 DIAGNOSIS — E039 Hypothyroidism, unspecified: Secondary | ICD-10-CM | POA: Diagnosis not present

## 2016-05-31 DIAGNOSIS — N951 Menopausal and female climacteric states: Secondary | ICD-10-CM

## 2016-05-31 DIAGNOSIS — Z833 Family history of diabetes mellitus: Secondary | ICD-10-CM

## 2016-05-31 LAB — POCT URINALYSIS DIPSTICK
Bilirubin, UA: NEGATIVE
Glucose, UA: NEGATIVE
Ketones, UA: NEGATIVE
Leukocytes, UA: NEGATIVE
NITRITE UA: NEGATIVE
PH UA: 7
PROTEIN UA: NEGATIVE
SPEC GRAV UA: 1.015
UROBILINOGEN UA: 0.2

## 2016-05-31 MED ORDER — ESCITALOPRAM OXALATE 10 MG PO TABS: 5.0000 mg | ORAL_TABLET | Freq: Every day | ORAL | 1 refills | Status: DC

## 2016-05-31 MED ORDER — EST ESTROGENS-METHYLTEST 0.625-1.25 MG PO TABS: ORAL_TABLET | ORAL | 5 refills | Status: DC

## 2016-05-31 NOTE — Patient Instructions (Signed)

## 2016-05-31 NOTE — Progress Notes (Signed)
Patient: Meredith Scott, Female    DOB: 08-15-1945, 70 y.o.   MRN: [id number] Visit Date: 05/31/2016  Today's Provider: Mar Daring, PA-C   Chief Complaint  Patient presents with  . Annual Exam   Subjective:    Annual physical exam Meredith Scott is a 70 y.o. female who presents today for health maintenance and complete physical. She feels well. She reports exercising. She reports she is sleeping well with the escitalopram. She takes 1/2 of the dosage she reports that 10 mg is strong for her.  05-28-15 Pap-Negative HPV-negative 12/02/15-Mammogram-BI-RADS 1 06/14/15-BMD 01/27/15- Colon-Diverticulosis,Polyps. -----------------------------------------------------------------   Review of Systems  Constitutional: Negative.   HENT: Negative.   Eyes: Negative.   Respiratory: Positive for cough (seasonal asthma).   Cardiovascular: Negative.   Gastrointestinal: Negative.   Endocrine: Negative.        On thyroid medicine and followed by Dr. Eddie Dibbles  Genitourinary: Negative.   Musculoskeletal: Positive for arthralgias and myalgias.  Skin: Negative.   Allergic/Immunologic: Negative.   Neurological: Negative.   Hematological: Negative.   Psychiatric/Behavioral: Negative.     Social History      She  reports that she has never smoked. She has never used smokeless tobacco. She reports that she does not drink alcohol or use drugs.       Social History   Social History  . Marital status: Married    Spouse name: N/A  . Number of children: 1  . Years of education: H/S   Occupational History  . Retired    Social History Main Topics  . Smoking status: Never Smoker  . Smokeless tobacco: Never Used  . Alcohol use No  . Drug use: No  . Sexual activity: Not Asked   Other Topics Concern  . None   Social History Narrative  . None    Past Medical History:  Diagnosis Date  . Anxiety   . Asthma   . Asymptomatic varicose veins 2011  . Breast screening,  unspecified 2013  . Diffuse cystic mastopathy 2013  . Hypertension   . Hypothyroidism    Dr Eddie Dibbles  . Obesity, unspecified 2013  . Screening for obesity 2013  . Special screening for malignant neoplasms, colon 2013     Patient Active Problem List   Diagnosis Date Noted  . Elevated LDL cholesterol level 05/28/2015  . Acid reflux 04/17/2015  . Acquired hypothyroidism 04/17/2015  . Arthritis 04/17/2015  . Blood pressure elevated without history of HTN 04/17/2015  . Family history of diabetes mellitus 04/17/2015  . Allergic rhinitis 04/07/2015  . Anxiety 04/07/2015  . Arthropathy of temporomandibular joint 04/07/2015  . Breath shortness 04/07/2015  . Spasm 04/07/2015  . Climacteric 04/07/2015  . Adenopathy, cervical 04/07/2015  . Asthma 02/02/2015  . Fibrocystic breast disease 11/26/2012    Past Surgical History:  Procedure Laterality Date  . ABDOMINAL HYSTERECTOMY  1994  . BREAST CYST ASPIRATION Right 1997?   breast aspiration d/t infected milk gland  . BREAST MASS EXCISION Left 2011  . COLONOSCOPY  2005   done by Dr. Jamal Collin  . COLONOSCOPY N/A 01/27/2015   Procedure: COLONOSCOPY;  Surgeon: Christene Lye, MD;  Location: [facility] ENDOSCOPY;  Service: Endoscopy;  Laterality: N/A;  . DILATION AND CURETTAGE OF UTERUS  2007  . FOOT SURGERY Right   . LIPOMA EXCISION  2010   breast bone  . TONGUE BIOPSY      Family History  Family Status  Relation Status  . Mother Deceased at age 3  . Other Alive  . Father Deceased  . Sister Alive  . Brother Alive  . Maternal Uncle         Her family history includes Breast cancer in her other; Diabetes in her father; Healthy in her brother and sister; Liver cancer in her mother; Lung cancer in her maternal uncle; Thyroid disease in her mother.     Allergies  Allergen Reactions  . Shellfish Allergy Other (See Comments)    Throat closes  . Sulfa Antibiotics Swelling  . Neosporin [Neomycin-Bacitracin Zn-Polymyx] Rash      Current Outpatient Prescriptions:  .  Ascorbic Acid (VITAMIN C PO), Take by mouth., Disp: , Rfl:  .  aspirin 81 MG tablet, Take 81 mg by mouth daily., Disp: , Rfl:  .  beclomethasone (BECONASE-AQ) 42 MCG/SPRAY nasal spray, Place 2 sprays into both nostrils daily. Dose is for each nostril., Disp: 25 g, Rfl: 5 .  budesonide-formoterol (SYMBICORT) 160-4.5 MCG/ACT inhaler, Inhale 2 puffs into the lungs 2 (two) times daily., Disp: 10.2 g, Rfl: 5 .  calcium-vitamin D (OSCAL 500/200 D-3) 500-200 MG-UNIT tablet, Take by mouth., Disp: , Rfl:  .  chlorzoxazone (PARAFON) 500 MG tablet, Take 500 mg by mouth as needed for muscle spasms. Reported on 12/03/2015, Disp: , Rfl:  .  escitalopram (LEXAPRO) 10 MG tablet, Take 0.5 tablets (5 mg total) by mouth daily., Disp: 45 tablet, Rfl: 1 .  estrogen-methylTESTOSTERone (EST ESTROGENS-METHYLTEST HS) 0.625-1.25 MG tablet, TAKE ONE (1) TABLET EACH DAY, Disp: 30 tablet, Rfl: 5 .  Fish Oil OIL, by Does not apply route as directed., Disp: , Rfl:  .  Ipratropium-Albuterol (COMBIVENT RESPIMAT) 20-100 MCG/ACT AERS respimat, Inhale 1 puff into the lungs every 4 (four) hours as needed for wheezing., Disp: 4 g, Rfl: 12 .  levothyroxine (SYNTHROID, LEVOTHROID) 25 MCG tablet, Take 1 tablet by mouth daily. 2 on sunday, Disp: , Rfl:  .  loratadine (CLARITIN) 10 MG tablet, Take 10 mg by mouth daily., Disp: , Rfl:  .  meprobamate (EQUANIL) 400 MG tablet, Take 400 mg by mouth as needed for pain., Disp: , Rfl:  .  metroNIDAZOLE (METROCREAM) 0.75 % cream, Apply 1 application topically daily., Disp: , Rfl:  .  Multiple Vitamin (MULTIVITAMIN) capsule, Take 1 capsule by mouth daily., Disp: , Rfl:  .  zafirlukast (ACCOLATE) 20 MG tablet, TAKE ONE TABLET TWICE DAILY, Disp: 60 tablet, Rfl: 5 .  predniSONE (STERAPRED UNI-PAK 21 TAB) 10 MG (21) TBPK tablet, Take as directed on package instructions (Patient not taking: Reported on 05/31/2016), Disp: 21 tablet, Rfl: 0   Patient Care  Team: Mar Daring, PA-C as PCP - General (Family Medicine) Seeplaputhur Robinette Haines, MD (General Surgery) Margarita Rana, MD as Referring Physician (Family Medicine)      Objective:   Vitals: BP 118/68 (BP Location: Right Arm, Patient Position: Sitting, Cuff Size: Normal)   Pulse 66   Temp 97.8 F (36.6 C) (Oral)   Resp 16   Ht 5\' 4"  (1.626 m)   Wt 179 lb 6.4 oz (81.4 kg)   BMI 30.79 kg/m    Physical Exam  Constitutional: She is oriented to person, place, and time. She appears well-developed and well-nourished. No distress.  HENT:  Head: Normocephalic and atraumatic.  Right Ear: Tympanic membrane, external ear and ear canal normal.  Left Ear: Tympanic membrane, external ear and ear canal normal.  Nose: Nose normal.  Mouth/Throat: Oropharynx  is clear and moist. No oropharyngeal exudate.  Eyes: Conjunctivae and EOM are normal. Pupils are equal, round, and reactive to light. Right eye exhibits no discharge. Left eye exhibits no discharge. No scleral icterus.  Neck: Normal range of motion. Neck supple. No JVD present. Carotid bruit is not present. No tracheal deviation present. No thyromegaly present.  Cardiovascular: Normal rate, regular rhythm, normal heart sounds and intact distal pulses.  Exam reveals no gallop and no friction rub.   No murmur heard. Pulmonary/Chest: Effort normal and breath sounds normal. No respiratory distress. She has no wheezes. She has no rales. She exhibits no tenderness. Right breast exhibits no inverted nipple, no mass, no nipple discharge, no skin change and no tenderness. Left breast exhibits no inverted nipple, no mass, no nipple discharge, no skin change and no tenderness. Breasts are symmetrical.  Abdominal: Soft. Bowel sounds are normal. She exhibits no distension and no mass. There is no tenderness. There is no rebound and no guarding.  Musculoskeletal: Normal range of motion. She exhibits no edema or tenderness.  Lymphadenopathy:    She has no  cervical adenopathy.  Neurological: She is alert and oriented to person, place, and time.  Skin: Skin is warm and dry. No rash noted. She is not diaphoretic.  Psychiatric: She has a normal mood and affect. Her behavior is normal. Judgment and thought content normal.  Vitals reviewed.    Depression Screen PHQ 2/9 Scores 05/31/2016 05/28/2015  PHQ - 2 Score 0 0      Assessment & Plan:     Routine Health Maintenance and Physical Exam  Exercise Activities and Dietary recommendations Goals    None      Immunization History  Administered Date(s) Administered  . Influenza, High Dose Seasonal PF 04/26/2015, 04/22/2016  . Pneumococcal Conjugate-13 05/22/2014  . Pneumococcal Polysaccharide-23 05/20/2012  . Td 03/14/2004  . Tdap 04/21/2011  . Zoster 12/02/2012    Health Maintenance  Topic Date Due  . MAMMOGRAM  12/01/2017  . COLONOSCOPY  01/27/2020  . TETANUS/TDAP  04/20/2021  . INFLUENZA VACCINE  Completed  . DEXA SCAN  Completed  . ZOSTAVAX  Completed  . Hepatitis C Screening  Completed  . PNA vac Low Risk Adult  Completed     Discussed health benefits of physical activity, and encouraged her to engage in regular exercise appropriate for her age and condition.    1. Annual physical exam Normal physical exam today. Will check labs as below and f/u pending lab results. If labs are stable and WNL she will not need to have these rechecked for one year at her next annual physical exam. She is to call the office in the meantime if she has any acute issue, questions or concerns. - CBC with Differential/Platelet - Comprehensive metabolic panel  2. Breast cancer screening Breast exam was normal today. She is due for her mammogram in May 2018 with Dr. Jamal Collin.  3. Climacteric Stable. Diagnosis pulled for medication refill. Continue current medical treatment plan. - estrogen-methylTESTOSTERone (EST ESTROGENS-METHYLTEST HS) 0.625-1.25 MG tablet; TAKE ONE (1) TABLET EACH DAY   Dispense: 30 tablet; Refill: 5  4. Acquired hypothyroidism Will check labs as below and f/u pending results. - TSH  5. Elevated LDL cholesterol level Will check labs as below and f/u pending results. - Lipid panel  6. Family history of diabetes mellitus Will check labs as below and f/u pending results. - Hemoglobin A1c  7. Asymptomatic microscopic hematuria History of trace microscopic hematuria. This was present  on today's UA. Patient is asymptomatic and urinalysis otherwise unremarkable. - POCT urinalysis dipstick  --------------------------------------------------------------------    Mar Daring, PA-C  [facility]

## 2016-06-06 ENCOUNTER — Telehealth: Payer: Self-pay

## 2016-06-06 LAB — LIPID PANEL
CHOLESTEROL TOTAL: 211 mg/dL — AB (ref 100–199)
Chol/HDL Ratio: 3.9 ratio units (ref 0.0–4.4)
HDL: 54 mg/dL (ref >39)
LDL Calculated: 141 mg/dL — ABNORMAL HIGH (ref 0–99)
Triglycerides: 81 mg/dL (ref 0–149)
VLDL CHOLESTEROL CAL: 16 mg/dL (ref 5–40)

## 2016-06-06 LAB — COMPREHENSIVE METABOLIC PANEL
ALT: 19 IU/L (ref 0–32)
AST: 20 IU/L (ref 0–40)
Albumin/Globulin Ratio: 2 (ref 1.2–2.2)
Albumin: 4.5 g/dL (ref 3.6–4.8)
Alkaline Phosphatase: 70 IU/L (ref 39–117)
BUN/Creatinine Ratio: 19 (ref 12–28)
BUN: 18 mg/dL (ref 8–27)
Bilirubin Total: 0.5 mg/dL (ref 0.0–1.2)
CALCIUM: 9.4 mg/dL (ref 8.7–10.3)
CO2: 27 mmol/L (ref 18–29)
CREATININE: 0.94 mg/dL (ref 0.57–1.00)
Chloride: 100 mmol/L (ref 96–106)
GFR, EST AFRICAN AMERICAN: 72 mL/min/{1.73_m2} (ref >59)
GFR, EST NON AFRICAN AMERICAN: 62 mL/min/{1.73_m2} (ref >59)
Globulin, Total: 2.2 g/dL (ref 1.5–4.5)
Glucose: 104 mg/dL — ABNORMAL HIGH (ref 65–99)
Potassium: 4.5 mmol/L (ref 3.5–5.2)
Sodium: 141 mmol/L (ref 134–144)
TOTAL PROTEIN: 6.7 g/dL (ref 6.0–8.5)

## 2016-06-06 LAB — CBC WITH DIFFERENTIAL/PLATELET
Basophils Absolute: 0 10*3/uL (ref 0.0–0.2)
Basos: 1 %
EOS (ABSOLUTE): 0.2 10*3/uL (ref 0.0–0.4)
Eos: 3 %
Hematocrit: 41.3 % (ref 34.0–46.6)
Hemoglobin: 14.1 g/dL (ref 11.1–15.9)
IMMATURE GRANS (ABS): 0 10*3/uL (ref 0.0–0.1)
IMMATURE GRANULOCYTES: 0 %
LYMPHS: 31 %
Lymphocytes Absolute: 1.9 10*3/uL (ref 0.7–3.1)
MCH: 31.9 pg (ref 26.6–33.0)
MCHC: 34.1 g/dL (ref 31.5–35.7)
MCV: 93 fL (ref 79–97)
MONOS ABS: 0.7 10*3/uL (ref 0.1–0.9)
Monocytes: 11 %
Neutrophils Absolute: 3.3 10*3/uL (ref 1.4–7.0)
Neutrophils: 54 %
PLATELETS: 267 10*3/uL (ref 150–379)
RBC: 4.42 x10E6/uL (ref 3.77–5.28)
RDW: 12.7 % (ref 12.3–15.4)
WBC: 6 10*3/uL (ref 3.4–10.8)

## 2016-06-06 LAB — TSH: TSH: 1.43 u[IU]/mL (ref 0.450–4.500)

## 2016-06-06 LAB — HEMOGLOBIN A1C
Est. average glucose Bld gHb Est-mCnc: 108 mg/dL
Hgb A1c MFr Bld: 5.4 % (ref 4.8–5.6)

## 2016-06-06 NOTE — Telephone Encounter (Signed)
-----   Message from Mar Daring, PA-C sent at 06/06/2016  8:20 AM EST ----- Cholesterol is borderline elevated. Work on healthy lifestyle modifications including healthy diet, limiting fatty foods and carbs, and physical activity. Continue fish oil if you are still taking this, if not, recommend restarting. All other labs are WNL and stable.

## 2016-06-06 NOTE — Telephone Encounter (Signed)
LMTCB

## 2016-06-06 NOTE — Telephone Encounter (Signed)
Patient advised as below. Patient verbalizes understanding and is in agreement with treatment plan.  

## 2016-07-17 DIAGNOSIS — C44300 Unspecified malignant neoplasm of skin of unspecified part of face: Secondary | ICD-10-CM

## 2016-07-17 HISTORY — DX: Unspecified malignant neoplasm of skin of unspecified part of face: C44.300

## 2016-07-31 ENCOUNTER — Encounter: Payer: Self-pay | Admitting: Physician Assistant

## 2016-07-31 ENCOUNTER — Ambulatory Visit (INDEPENDENT_AMBULATORY_CARE_PROVIDER_SITE_OTHER): Payer: BLUE CROSS/BLUE SHIELD | Admitting: Physician Assistant

## 2016-07-31 VITALS — BP 114/72 | HR 68 | Temp 98.0°F | Resp 16 | Wt 181.0 lb

## 2016-07-31 DIAGNOSIS — N3090 Cystitis, unspecified without hematuria: Secondary | ICD-10-CM

## 2016-07-31 LAB — POCT URINALYSIS DIPSTICK
Bilirubin, UA: NEGATIVE
Glucose, UA: NEGATIVE
Ketones, UA: NEGATIVE
Nitrite, UA: NEGATIVE
Protein, UA: NEGATIVE
Spec Grav, UA: <=1.005
Urobilinogen, UA: 0.2
pH, UA: 6

## 2016-07-31 MED ORDER — AMOXICILLIN-POT CLAVULANATE 500-125 MG PO TABS: 1.0000 | ORAL_TABLET | Freq: Two times a day (BID) | ORAL | 0 refills | Status: AC

## 2016-07-31 NOTE — Progress Notes (Signed)
[place]  Chief Complaint  Patient presents with  . Urinary Tract Infection    symptoms started Thursday    Subjective:    Patient ID: Meredith Scott, female    DOB: July 07, 1946, 71 y.o.   MRN: [id number]   Urinary Tract Infection: Patient complains of burning with urination, dysuria and frequency She has had symptoms for 4 days. Denies back pain, nausea, vomiting. Patient denies fever and vaginal discharge. Patient does have a history of recurrent UTI.  Pt reports her last UTI was about 5 years ago.  Patient does not have a history of pyelonephritis or other renal issues. Patient denies vaginal discharge and denies new sexual partners. The patient denies recent travel outside of the [place].  Review of Systems  Constitutional: Positive for malaise/fatigue. Negative for chills, diaphoresis, fever and weight loss.  Gastrointestinal: Positive for abdominal pain. Negative for blood in stool, constipation, diarrhea, heartburn, melena, nausea and vomiting.  Genitourinary: Positive for dysuria, frequency and urgency. Negative for flank pain and hematuria.  Skin: Negative for rash.  Neurological: Positive for headaches. Negative for dizziness and weakness.       Objective:   BP 114/72 (BP Location: Left Arm, Patient Position: Sitting, Cuff Size: Normal)   Pulse 68   Temp 98 F (36.7 C) (Oral)   Resp 16   Wt 181 lb (82.1 kg)   BMI 31.07 kg/m   Patient Active Problem List   Diagnosis Date Noted  . Elevated LDL cholesterol level 05/28/2015  . Acid reflux 04/17/2015  . Acquired hypothyroidism 04/17/2015  . Arthritis 04/17/2015  . Blood pressure elevated without history of HTN 04/17/2015  . Family history of diabetes mellitus 04/17/2015  . Allergic rhinitis 04/07/2015  . Anxiety 04/07/2015  . Arthropathy of temporomandibular joint 04/07/2015  . Breath shortness 04/07/2015  . Spasm 04/07/2015  . Climacteric 04/07/2015  .  Adenopathy, cervical 04/07/2015  . Asthma 02/02/2015  . Fibrocystic breast disease 11/26/2012    Outpatient Encounter Prescriptions as of 07/31/2016  Medication Sig Note  . Ascorbic Acid (VITAMIN C PO) Take by mouth.   Marland Kitchen aspirin 81 MG tablet Take 81 mg by mouth daily.   . beclomethasone (BECONASE-AQ) 42 MCG/SPRAY nasal spray Place 2 sprays into both nostrils daily. Dose is for each nostril.   . budesonide-formoterol (SYMBICORT) 160-4.5 MCG/ACT inhaler Inhale 2 puffs into the lungs 2 (two) times daily.   . calcium-vitamin D (OSCAL 500/200 D-3) 500-200 MG-UNIT tablet Take by mouth. 04/17/2015: Received from: [facility]  . escitalopram (LEXAPRO) 10 MG tablet Take 0.5 tablets (5 mg total) by mouth daily.   Marland Kitchen estrogen-methylTESTOSTERone (EST ESTROGENS-METHYLTEST HS) 0.625-1.25 MG tablet TAKE ONE (1) TABLET EACH DAY   . Fish Oil OIL by Does not apply route as directed.   . Ipratropium-Albuterol (COMBIVENT RESPIMAT) 20-100 MCG/ACT AERS respimat Inhale 1 puff into the lungs every 4 (four) hours as needed for wheezing.   Marland Kitchen levothyroxine (SYNTHROID, LEVOTHROID) 25 MCG tablet Take 1 tablet by mouth daily. 2 on sunday 12/09/2013: Received from: External Pharmacy Received Sig:   . loratadine (CLARITIN) 10 MG tablet Take 10 mg by mouth daily.   . meprobamate (EQUANIL) 400 MG tablet Take 400 mg by mouth as needed for pain.   . metroNIDAZOLE (METROCREAM) 0.75 % cream Apply 1 application topically daily.   . Multiple Vitamin (MULTIVITAMIN) capsule Take 1 capsule by mouth daily.   . zafirlukast (ACCOLATE) 20 MG tablet TAKE ONE TABLET TWICE DAILY   .  amoxicillin-clavulanate (AUGMENTIN) 500-125 MG tablet Take 1 tablet (500 mg total) by mouth 2 (two) times daily.   . chlorzoxazone (PARAFON) 500 MG tablet Take 500 mg by mouth as needed for muscle spasms. Reported on 12/03/2015   . [DISCONTINUED] predniSONE (STERAPRED UNI-PAK 21 TAB) 10 MG (21) TBPK tablet Take as directed on package instructions  (Patient not taking: Reported on 05/31/2016)    No facility-administered encounter medications on file as of 07/31/2016.     Allergies  Allergen Reactions  . Shellfish Allergy Other (See Comments)    Throat closes  . Sulfa Antibiotics Swelling  . Neosporin [Neomycin-Bacitracin Zn-Polymyx] Rash       Physical Exam  Constitutional: She is oriented to person, place, and time. She appears well-developed and well-nourished. No distress.  Cardiovascular: Normal rate and regular rhythm.   Pulmonary/Chest: Effort normal and breath sounds normal.  Abdominal: Soft. Bowel sounds are normal. She exhibits no distension. There is tenderness in the suprapubic area. There is no rebound, no guarding and no CVA tenderness.  Neurological: She is alert and oriented to person, place, and time.  Skin: Skin is warm and dry. She is not diaphoretic.  Psychiatric: She has a normal mood and affect. Her behavior is normal.       Assessment & Plan:   1. Cystitis  Patient presenting with symptoms of the above. Will treat as below and await culture.  - POCT urinalysis dipstick - Urine Culture - amoxicillin-clavulanate (AUGMENTIN) 500-125 MG tablet; Take 1 tablet (500 mg total) by mouth 2 (two) times daily.  Dispense: 10 tablet; Refill: 0  The entirety of the information documented in the History of Present Illness, Review of Systems and Physical Exam were personally obtained by me. Portions of this information were initially documented by Ashley Royalty, CMA and reviewed by me for thoroughness and accuracy.   Return if symptoms worsen or fail to improve.   Patient Instructions  Urinary Tract Infection, Adult A urinary tract infection (UTI) is an infection of any part of the urinary tract, which includes the kidneys, ureters, bladder, and urethra. These organs make, store, and get rid of urine in the body. UTI can be a bladder infection (cystitis) or kidney infection (pyelonephritis). What are the  causes? This infection may be caused by fungi, viruses, or bacteria. Bacteria are the most common cause of UTIs. This condition can also be caused by repeated incomplete emptying of the bladder during urination. What increases the risk? This condition is more likely to develop if:  You ignore your need to urinate or hold urine for long periods of time.  You do not empty your bladder completely during urination.  You wipe back to front after urinating or having a bowel movement, if you are female.  You are uncircumcised, if you are female.  You are constipated.  You have a urinary catheter that stays in place (indwelling).  You have a weak defense (immune) system.  You have a medical condition that affects your bowels, kidneys, or bladder.  You have diabetes.  You take antibiotic medicines frequently or for long periods of time, and the antibiotics no longer work well against certain types of infections (antibiotic resistance).  You take medicines that irritate your urinary tract.  You are exposed to chemicals that irritate your urinary tract.  You are female. What are the signs or symptoms? Symptoms of this condition include:  Fever.  Frequent urination or passing small amounts of urine frequently.  Needing to urinate urgently.  Pain or burning with urination.  Urine that smells bad or unusual.  Cloudy urine.  Pain in the lower abdomen or back.  Trouble urinating.  Blood in the urine.  Vomiting or being less hungry than normal.  Diarrhea or abdominal pain.  Vaginal discharge, if you are female. How is this diagnosed? This condition is diagnosed with a medical history and physical exam. You will also need to provide a urine sample to test your urine. Other tests may be done, including:  Blood tests.  Sexually transmitted disease (STD) testing. If you have had more than one UTI, a cystoscopy or imaging studies may be done to determine the cause of the  infections. How is this treated? Treatment for this condition often includes a combination of two or more of the following:  Antibiotic medicine.  Other medicines to treat less common causes of UTI.  Over-the-counter medicines to treat pain.  Drinking enough water to stay hydrated. Follow these instructions at home:  Take over-the-counter and prescription medicines only as told by your health care provider.  If you were prescribed an antibiotic, take it as told by your health care provider. Do not stop taking the antibiotic even if you start to feel better.  Avoid alcohol, caffeine, tea, and carbonated beverages. They can irritate your bladder.  Drink enough fluid to keep your urine clear or pale yellow.  Keep all follow-up visits as told by your health care provider. This is important.  Make sure to:  Empty your bladder often and completely. Do not hold urine for long periods of time.  Empty your bladder before and after sex.  Wipe from front to back after a bowel movement if you are female. Use each tissue one time when you wipe. Contact a health care provider if:  You have back pain.  You have a fever.  You feel nauseous or vomit.  Your symptoms do not get better after 3 days.  Your symptoms go away and then return. Get help right away if:  You have severe back pain or lower abdominal pain.  You are vomiting and cannot keep down any medicines or water. This information is not intended to replace advice given to you by your health care provider. Make sure you discuss any questions you have with your health care provider. Document Released: 04/12/2005 Document Revised: 12/15/2015 Document Reviewed: 05/24/2015 Elsevier Interactive Patient Education  2017 [employer].

## 2016-07-31 NOTE — Patient Instructions (Signed)

## 2016-08-02 LAB — URINE CULTURE

## 2016-08-04 ENCOUNTER — Telehealth: Payer: Self-pay

## 2016-08-04 NOTE — Telephone Encounter (Signed)
Notes Recorded by Trinna Post, PA-C on 08/04/2016 at 10:10 AM EST Urine cx did not grow anything but hopefully patient is no longer having symptoms.

## 2016-08-04 NOTE — Telephone Encounter (Signed)
-----   Message from [place] sent at 08/04/2016 10:56 AM EST ----- [facility]  ED  Could not get pool for Adriana to come up

## 2016-08-07 ENCOUNTER — Encounter: Payer: Self-pay | Admitting: Physician Assistant

## 2016-08-07 DIAGNOSIS — E7800 Pure hypercholesterolemia, unspecified: Secondary | ICD-10-CM

## 2016-08-07 DIAGNOSIS — Z6831 Body mass index (BMI) 31.0-31.9, adult: Principal | ICD-10-CM

## 2016-08-07 DIAGNOSIS — E6609 Other obesity due to excess calories: Secondary | ICD-10-CM

## 2016-08-07 NOTE — Telephone Encounter (Signed)
Pt advised.   Thanks,   -Laura  

## 2016-08-18 ENCOUNTER — Encounter: Payer: Self-pay | Admitting: Physician Assistant

## 2016-08-29 ENCOUNTER — Encounter: Payer: Self-pay | Admitting: Dietician

## 2016-08-29 ENCOUNTER — Other Ambulatory Visit: Payer: Self-pay

## 2016-08-29 DIAGNOSIS — N951 Menopausal and female climacteric states: Secondary | ICD-10-CM

## 2016-08-29 MED ORDER — EST ESTROGENS-METHYLTEST 0.625-1.25 MG PO TABS: ORAL_TABLET | ORAL | 5 refills | Status: DC

## 2016-08-29 NOTE — Telephone Encounter (Signed)
New prescription request is required per [facility].  Medication: Estrogen-Methyltestos H.S tab Qty:30 R:5

## 2016-08-30 NOTE — Telephone Encounter (Signed)
Prescription for patient was called in to [facility].  Thanks,  -Joseline

## 2016-09-05 ENCOUNTER — Encounter: Payer: Self-pay | Admitting: Dietician

## 2016-09-05 ENCOUNTER — Encounter: Payer: BLUE CROSS/BLUE SHIELD | Attending: Physician Assistant | Admitting: Dietician

## 2016-09-05 VITALS — Ht 63.5 in | Wt 177.9 lb

## 2016-09-05 DIAGNOSIS — E669 Obesity, unspecified: Secondary | ICD-10-CM | POA: Diagnosis not present

## 2016-09-05 DIAGNOSIS — Z6831 Body mass index (BMI) 31.0-31.9, adult: Secondary | ICD-10-CM | POA: Diagnosis not present

## 2016-09-05 NOTE — Progress Notes (Signed)
Medical Nutrition Therapy: Visit start time: 9:00   end time:10:10am Assessment:  Diagnosis: obesity Past medical history: asthma, elevated LDL cholesterol  Psychosocial issues/ stress concerns: Patient rates her stress as moderate and indicates "ok" as to how well she is dealing with her stress. She scored a "3" on the PHQ depression scale which she relates to seasonal depression as well as health issues of her husband. Offered to schedule an appointment with an [facility] counselor and she states she will consider it and let me know. Preferred learning method:  . Auditory . Visual  Current weight: 177.9 lbs Height: 63.5 in Medications, supplements: see list Progress and evaluation: Patient in for initial medical nutrition therapy visit. She expressed frustration regarding lack of weight loss despite eating "healthy" most of the time. She states that she weighed 150 lbs 25 years ago and her weight has steadily increased since than. She gives an initial goal of losing 10 lbs. She has followed a Weight Watchers plan in the past and is familiar with food servings "exchanges". She eats "out" for 1 meal daily, usually a "fast food" choice. Snacks include breakfast bars, 4 pack of peanut butter crackers. She limits sweets and reads labels for carbohydrate grams. She states she has been more conscious of this after her husband was diagnosed with pre-diabetes. Her present meal pattern is low in fruits/vegetables, whole grains and calcium sources.  Physical activity: walking or cardio/weights at "[employer]", 3 days per week for 20 minutes.  Dietary Intake:  Usual eating pattern includes 3 meals and 1-2 snacks per day. Dining out frequency: 7-8 meals per week.  Breakfast: 7:00am cheerios/skim milk, almonds, cheese biscuit when eating "out' or peanut butter toast or yogurt/nuts Lunch: 12:00- roast beef sandwich, chicken nuggets with side salad/ranch dressing Snack: granola bar or pack of prunes  Supper:  6:00pm- chicken breast, steamed cabbage, green peas for example Snack: usually no snack; occasionally crackers  Beverages: water 6-8 cups/day; diet coke, or unsweetened tea  Nutrition Care Education:  Basic nutrition: Instructed on minimum food group servings needed to meet nutrient needs. Weight control: Discussed challenge of some nutrient needs increasing after 50 while  calorie needs decrease. Instructed on a meal plan based on 1300-1400 calories including carbohydrate counting and how to better balance carbohydrate foods, protein and non-starchy vegetables. Showed patient how to look up restaurant foods on calorieking.com to show how salad dressings etc. can add up calories significantly. Discussed how monitoring calorie intake for at least 1 week could help identify problem areas. Exercise: Commended on her exercise habits. Discussed how increasing intensity or time or both can make a difference in fitness and weight loss.  Nutritional Diagnosis:  Excessive calorie intake through hidden calories when "dining out" for example and less than recommended  exercise. Intervention:  Balance meals with 1-3 oz protein ( 6 oz/day), 2-3 servings of carbohydrate (8-9 servings per day), and non-starchy vegetables. Include at least 5 servings of fruits and vegetables daily. Count 1 cup of cooked vegetables as 2 servings. Keep a food record for at least 1 week. Use Calorieking.com to look up nutrition information for restaurant foods. Include fruits, yogurt, raw vegetables for snacks rather than bars and "nabs".  Increase exercise to 30 minutes, 3 days per week.   Education Materials given:  . Food lists/ Planning A Balanced Meal . Sample meal pattern/ menus . Goals/ instructions  Learner/ who was taught:  . Patient  Level of understanding: . Partial understanding; needs review/ practice Demonstrated degree of  understanding via:   Teach back Learning barriers: . None  Willingness to learn/  readiness for change: . Acceptance, ready for change  Monitoring and Evaluation:  Dietary intake, exercise, , and body weight      follow up: 10/03/16 at 9:00am

## 2016-09-05 NOTE — Patient Instructions (Signed)
Balance meals with 1-3 oz protein ( 6 oz/day), 2-3 servings of carbohydrate (8-9 servings per day), and non-starchy vegetables. Include at least 5 servings of fruits and vegetables daily. Count 1 cup of cooked vegetables as 2 servings. Keep a food record for at least 1 week. Use Calorieking.com to look up nutrition information for restaurant foods. Include fruits, yogurt, raw vegetables for snacks rather than bars and "nabs".  Increase exercise to 30 minutes, 3 days per week.

## 2016-09-28 ENCOUNTER — Encounter: Payer: Self-pay | Admitting: General Surgery

## 2016-10-03 ENCOUNTER — Encounter: Payer: BLUE CROSS/BLUE SHIELD | Attending: Physician Assistant | Admitting: Dietician

## 2016-10-03 VITALS — Ht 63.5 in | Wt 176.5 lb

## 2016-10-03 DIAGNOSIS — E669 Obesity, unspecified: Secondary | ICD-10-CM | POA: Diagnosis not present

## 2016-10-03 DIAGNOSIS — Z6831 Body mass index (BMI) 31.0-31.9, adult: Secondary | ICD-10-CM

## 2016-10-03 NOTE — Progress Notes (Signed)
Medical Nutrition Therapy Follow-up visit:  Time: 9:00- 9:30am Visit #:2 ASSESSMENT:  Diagnosis:obesity  Current weight:176.5 lbs   Height:63.5 in Medications: See list Medical History: elevated LDL cholesterol Progress and evaluation: Patient reports she has been more mindful of her food intake, especially her carbohydrate and protein intake. She kept food records since her previous appointment. She has increased protein through her preference of non-meat sources such as nuts, peanut butter, beans, and  egg salad.  She is more conscious of portions especially breads and starchy foods. She states, "I do not feel hungry or have cravings most of the time. I am more focused on the nutrition aspect of my food".  She weighs 1.4 lbs less than weight 1 month ago.   Physical activity: Has increased to 5 days per week; [employer] 2-3 days; walking at the mall 2-3 days/week.   NUTRITION CARE EDUCATION:  Weight control:  Commended on positive diet and exercise changes. Reviewed food records with patient, recognizing the positive changes of increase in protein foods and portion control. Discussed calcium sources and whole grains. Suggested myfitnesspal as a site to keep a food record as an alternative to calorieking. Reviewed nutrition labels  with patient of some of the food products she brought.  INTERVENTION:  Try http://vang.com/ site in order to record food intake. Continue with previous goals. Include fruit, vegetable or both with as many meals as possible. Continue with 5 days of exercise and work to increase intensity.  EDUCATION MATERIALS GIVEN:  . Goals/ instructions  LEARNER/ who was taught:  . Patient   LEVEL OF UNDERSTANDING: . Verbalizes/ demonstrates competency Demonstrated degree of understanding via: Teach back LEARNING BARRIERS: . None WILLINGNESS TO LEARN/READINESS FOR CHANGE: . Eager, change in progress  MONITORING AND EVALUATION:  No follow-up scheduled. Patient  was encouraged to call if desires further help with her diet/nutrition.

## 2016-10-03 NOTE — Patient Instructions (Signed)
Try http://vang.com/ site. Continue with previous goals. Include fruit, vegetable or both with as many meals as possible. Continue with 5 days of exercise and work to increase intensity.

## 2016-12-05 ENCOUNTER — Encounter: Payer: Self-pay | Admitting: General Surgery

## 2016-12-12 ENCOUNTER — Ambulatory Visit (INDEPENDENT_AMBULATORY_CARE_PROVIDER_SITE_OTHER): Payer: BLUE CROSS/BLUE SHIELD | Admitting: General Surgery

## 2016-12-12 ENCOUNTER — Encounter: Payer: Self-pay | Admitting: General Surgery

## 2016-12-12 VITALS — BP 130/62 | HR 64 | Resp 12 | Ht 63.5 in | Wt 177.0 lb

## 2016-12-12 DIAGNOSIS — N6012 Diffuse cystic mastopathy of left breast: Secondary | ICD-10-CM

## 2016-12-12 DIAGNOSIS — Z86010 Personal history of colonic polyps: Secondary | ICD-10-CM | POA: Diagnosis not present

## 2016-12-12 DIAGNOSIS — I8393 Asymptomatic varicose veins of bilateral lower extremities: Secondary | ICD-10-CM | POA: Diagnosis not present

## 2016-12-12 DIAGNOSIS — N6011 Diffuse cystic mastopathy of right breast: Secondary | ICD-10-CM

## 2016-12-12 NOTE — Progress Notes (Signed)
Patient ID: Meredith Scott, female   DOB: 1946/05/29, 71 y.o.   MRN: [id number]  Chief Complaint  Patient presents with  . Follow-up    mammogram    HPI Meredith Scott is a 71 y.o. female.  who presents for a breast evaluation. The most recent mammogram was done on 11-15-16. No new breast issues. Patient does perform regular self breast checks and gets regular mammograms done.     HPI  Past Medical History:  Diagnosis Date  . Anxiety   . Asthma   . Asymptomatic varicose veins 2011  . Breast screening, unspecified 2013  . Colon polyp   . Diffuse cystic mastopathy 2013  . Hypertension   . Hypothyroidism    Dr Eddie Dibbles  . Obesity, unspecified 2013  . Screening for obesity 2013  . Skin cancer of face 2018   squamous/ left cheek  . Special screening for malignant neoplasms, colon 2013    Past Surgical History:  Procedure Laterality Date  . ABDOMINAL HYSTERECTOMY  1994  . BREAST CYST ASPIRATION Right 1997?   breast aspiration d/t infected milk gland  . BREAST MASS EXCISION Left 2011  . COLONOSCOPY  2005   done by Dr. Jamal Collin  . COLONOSCOPY N/A 01/27/2015   Procedure: COLONOSCOPY;  Surgeon: Christene Lye, MD;  Location: [facility] ENDOSCOPY;  Service: Endoscopy;  Laterality: N/A;  . DILATION AND CURETTAGE OF UTERUS  2007  . FOOT SURGERY Right   . LIPOMA EXCISION  2010   breast bone  . TONGUE BIOPSY      Family History  Problem Relation Age of Onset  . Liver cancer Mother   . Thyroid disease Mother   . Breast cancer Other   . Diabetes Father   . Healthy Sister   . Healthy Brother   . Lung cancer Maternal Uncle     Social History Social History  Substance Use Topics  . Smoking status: Never Smoker  . Smokeless tobacco: Never Used  . Alcohol use No    Allergies  Allergen Reactions  . Shellfish Allergy Other (See Comments)    Throat closes  . Sulfa Antibiotics Swelling  . Neosporin [Neomycin-Bacitracin Zn-Polymyx] Rash    Current Outpatient Prescriptions   Medication Sig Dispense Refill  . Ascorbic Acid (VITAMIN C PO) Take by mouth.    Marland Kitchen aspirin 81 MG tablet Take 81 mg by mouth daily.    . beclomethasone (BECONASE-AQ) 42 MCG/SPRAY nasal spray Place 2 sprays into both nostrils daily. Dose is for each nostril. 25 g 5  . budesonide-formoterol (SYMBICORT) 160-4.5 MCG/ACT inhaler Inhale 2 puffs into the lungs 2 (two) times daily. 10.2 g 5  . calcium-vitamin D (OSCAL 500/200 D-3) 500-200 MG-UNIT tablet Take by mouth.    . chlorzoxazone (PARAFON) 500 MG tablet Take 500 mg by mouth as needed for muscle spasms. Reported on 12/03/2015    . escitalopram (LEXAPRO) 10 MG tablet Take 0.5 tablets (5 mg total) by mouth daily. 45 tablet 1  . estrogen-methylTESTOSTERone (EST ESTROGENS-METHYLTEST HS) 0.625-1.25 MG tablet TAKE ONE (1) TABLET EACH DAY 30 tablet 5  . Fish Oil OIL by Does not apply route as directed.    . Ipratropium-Albuterol (COMBIVENT RESPIMAT) 20-100 MCG/ACT AERS respimat Inhale 1 puff into the lungs every 4 (four) hours as needed for wheezing. 4 g 12  . levothyroxine (SYNTHROID, LEVOTHROID) 25 MCG tablet Take 1 tablet by mouth daily. 2 on sunday    . loratadine (CLARITIN) 10 MG tablet Take 10 mg by mouth  daily.    . meprobamate (EQUANIL) 400 MG tablet Take 400 mg by mouth as needed for pain.    . metroNIDAZOLE (METROCREAM) 0.75 % cream Apply 1 application topically daily.    . Multiple Vitamin (MULTIVITAMIN) capsule Take 1 capsule by mouth daily.    . zafirlukast (ACCOLATE) 20 MG tablet TAKE ONE TABLET TWICE DAILY 60 tablet 5   No current facility-administered medications for this visit.     Review of Systems Review of Systems  Constitutional: Negative.   Respiratory: Negative.   Cardiovascular: Negative.     Blood pressure 130/62, pulse 64, resp. rate 12, height 5' 3.5" (1.613 m), weight 177 lb (80.3 kg).  Physical Exam Physical Exam  Constitutional: She is oriented to person, place, and time. She appears well-developed and  well-nourished.  HENT:  Mouth/Throat: Oropharynx is clear and moist.  Eyes: Conjunctivae are normal. No scleral icterus.  Neck: Neck supple.  Cardiovascular: Normal rate, regular rhythm and normal heart sounds.   Mild varicose veins, bilaterally. No lower leg edema. Bruise RLE inner calf.  Pulmonary/Chest: Effort normal and breath sounds normal. Right breast exhibits no inverted nipple, no mass, no nipple discharge, no skin change and no tenderness. Left breast exhibits no inverted nipple, no mass, no nipple discharge, no skin change and no tenderness.  Lymphadenopathy:    She has no cervical adenopathy.    She has no axillary adenopathy.  Neurological: She is alert and oriented to person, place, and time.  Skin: Skin is warm and dry.  Psychiatric: Her behavior is normal.    Data Reviewed Mammogram reviewed and stable  Assessment    Stable exam. FCD. Mild varicose veins. History of colon polyp    Plan    Patient would like to follow with Dr Bary Castilla in one year with a bilateral screening mammogram. Colonoscopy due 2021.      HPI, Physical Exam, Assessment and Plan have been scribed under the direction and in the presence of Mckinley Jewel, MD  Karie Fetch, RN I have completed the exam and reviewed the above documentation for accuracy and completeness.  I agree with the above.  Haematologist has been used and any errors in dictation or transcription are unintentional.  Jourdon Zimmerle G. Jamal Collin, M.D., F.A.C.S.   Junie Panning G 12/12/2016, 9:28 AM

## 2016-12-12 NOTE — Patient Instructions (Addendum)
The patient is aware to call back for any questions or concerns. Patient would like to follow with Dr Bary Castilla in one year with a bilateral screening mammogram. Colonoscopy due 2021.

## 2016-12-15 HISTORY — PX: SKIN CANCER EXCISION: SHX779

## 2016-12-19 ENCOUNTER — Other Ambulatory Visit: Payer: Self-pay | Admitting: Physician Assistant

## 2016-12-19 MED ORDER — ESCITALOPRAM OXALATE 10 MG PO TABS: 5.0000 mg | ORAL_TABLET | Freq: Every day | ORAL | 1 refills | Status: DC

## 2016-12-19 NOTE — Telephone Encounter (Signed)
Patient states she is taking 1/2 tablet daily-she cuts the whole tablet in half-aa

## 2016-12-19 NOTE — Telephone Encounter (Signed)
We have documented she is taking 1/2 tab daily. Can we verify dosing? Thanks. JB

## 2016-12-19 NOTE — Telephone Encounter (Signed)
[facility] faxed a refill request on the following medications:  escitalopram (LEXAPRO) 10 MG tablet.  Take 1 tablet each day.  90 day supply.  Asher McAdams/MW

## 2017-04-19 ENCOUNTER — Ambulatory Visit (INDEPENDENT_AMBULATORY_CARE_PROVIDER_SITE_OTHER): Payer: BLUE CROSS/BLUE SHIELD

## 2017-04-19 DIAGNOSIS — Z23 Encounter for immunization: Secondary | ICD-10-CM

## 2017-05-03 ENCOUNTER — Encounter: Payer: Self-pay | Admitting: Physician Assistant

## 2017-05-03 ENCOUNTER — Other Ambulatory Visit: Payer: Self-pay | Admitting: Physician Assistant

## 2017-05-03 DIAGNOSIS — N951 Menopausal and female climacteric states: Secondary | ICD-10-CM

## 2017-05-03 MED ORDER — EST ESTROGENS-METHYLTEST 0.625-1.25 MG PO TABS: ORAL_TABLET | ORAL | 5 refills | Status: DC

## 2017-05-18 ENCOUNTER — Telehealth: Payer: Self-pay | Admitting: Physician Assistant

## 2017-05-18 NOTE — Telephone Encounter (Signed)
Please Review.  Thanks,  -Joseline 

## 2017-05-18 NOTE — Telephone Encounter (Signed)
Meredith Scott with [facility] is requesting an Rx for the shingle vaccine. Meredith Scott stated pt is requesting to be on the pharmacy's wait list for the vaccine and in order to be on the wait list the pharmacy needs an Rx. Please advise. Thanks TNP

## 2017-05-18 NOTE — Telephone Encounter (Signed)
Ok to call in shingrix Rx for her so she can be added to wait list.

## 2017-05-18 NOTE — Telephone Encounter (Signed)
Jody at the [facility], RMA

## 2017-05-21 ENCOUNTER — Other Ambulatory Visit: Payer: Self-pay | Admitting: Physician Assistant

## 2017-06-01 ENCOUNTER — Ambulatory Visit: Payer: BLUE CROSS/BLUE SHIELD | Admitting: Physician Assistant

## 2017-06-01 ENCOUNTER — Encounter: Payer: Self-pay | Admitting: Physician Assistant

## 2017-06-01 VITALS — BP 120/80 | HR 64 | Temp 98.4°F | Resp 16 | Ht 64.0 in | Wt 180.0 lb

## 2017-06-01 DIAGNOSIS — R030 Elevated blood-pressure reading, without diagnosis of hypertension: Secondary | ICD-10-CM | POA: Diagnosis not present

## 2017-06-01 DIAGNOSIS — E039 Hypothyroidism, unspecified: Secondary | ICD-10-CM | POA: Diagnosis not present

## 2017-06-01 DIAGNOSIS — Z1159 Encounter for screening for other viral diseases: Secondary | ICD-10-CM

## 2017-06-01 DIAGNOSIS — Z0000 Encounter for general adult medical examination without abnormal findings: Secondary | ICD-10-CM

## 2017-06-01 DIAGNOSIS — E7800 Pure hypercholesterolemia, unspecified: Secondary | ICD-10-CM

## 2017-06-01 DIAGNOSIS — Z126 Encounter for screening for malignant neoplasm of bladder: Secondary | ICD-10-CM

## 2017-06-01 DIAGNOSIS — Z833 Family history of diabetes mellitus: Secondary | ICD-10-CM | POA: Diagnosis not present

## 2017-06-01 LAB — POCT URINALYSIS DIPSTICK
Bilirubin, UA: NEGATIVE
Glucose, UA: NEGATIVE
KETONES UA: NEGATIVE
Leukocytes, UA: NEGATIVE
NITRITE UA: NEGATIVE
Protein, UA: NEGATIVE
SPEC GRAV UA: 1.01 (ref 1.010–1.025)
UROBILINOGEN UA: 0.2 U/dL
pH, UA: 7 (ref 5.0–8.0)

## 2017-06-01 NOTE — Patient Instructions (Signed)

## 2017-06-01 NOTE — Progress Notes (Signed)
Patient: Meredith Scott, Female    DOB: Oct 23, 1945, 71 y.o.   MRN: [id number] Visit Date: 06/01/2017  Today's Provider: Mar Daring, PA-C   Chief Complaint  Patient presents with  . Annual Exam   Subjective:    Annual physical exam Meredith Scott is a 71 y.o. female who presents today for health maintenance and complete physical. She feels well. She reports exercising active with daily activities. She reports she is sleeping well.  05/31/16 CPE 05/28/15 Pap-neg; HPV-neg 12/04/16 Mammogram-BI-RADS 1 01/27/15 Colonoscopy-diverticulosis, polyp, recheck in 5 year 06/14/15 BMD -----------------------------------------------------------------   Review of Systems  Constitutional: Negative.   HENT: Negative.   Eyes: Negative.   Respiratory: Negative.   Cardiovascular: Negative.   Gastrointestinal: Negative.   Endocrine: Negative.   Genitourinary: Negative.   Musculoskeletal: Negative.   Skin: Negative.   Allergic/Immunologic: Negative.   Neurological: Negative.   Hematological: Negative.   Psychiatric/Behavioral: Negative.     Social History      She  reports that  has never smoked. she has never used smokeless tobacco. She reports that she does not drink alcohol or use drugs.       Social History   Socioeconomic History  . Marital status: Married    Spouse name: None  . Number of children: 1  . Years of education: H/S  . Highest education level: None  Social Needs  . Financial resource strain: None  . Food insecurity - worry: None  . Food insecurity - inability: None  . Transportation needs - medical: None  . Transportation needs - non-medical: None  Occupational History  . Occupation: Retired  Tobacco Use  . Smoking status: Never Smoker  . Smokeless tobacco: Never Used  Substance and Sexual Activity  . Alcohol use: No  . Drug use: No  . Sexual activity: None  Other Topics Concern  . None  Social History Narrative  . None    Past  Medical History:  Diagnosis Date  . Anxiety   . Asthma   . Asymptomatic varicose veins 2011  . Breast screening, unspecified 2013  . Colon polyp   . Diffuse cystic mastopathy 2013  . Hypertension   . Hypothyroidism    Dr Eddie Dibbles  . Obesity, unspecified 2013  . Screening for obesity 2013  . Skin cancer of face 2018   squamous/ left cheek  . Special screening for malignant neoplasms, colon 2013     Patient Active Problem List   Diagnosis Date Noted  . Elevated LDL cholesterol level 05/28/2015  . Acid reflux 04/17/2015  . Acquired hypothyroidism 04/17/2015  . Arthritis 04/17/2015  . Blood pressure elevated without history of HTN 04/17/2015  . Family history of diabetes mellitus 04/17/2015  . Allergic rhinitis 04/07/2015  . Anxiety 04/07/2015  . Arthropathy of temporomandibular joint 04/07/2015  . Breath shortness 04/07/2015  . Spasm 04/07/2015  . Climacteric 04/07/2015  . Adenopathy, cervical 04/07/2015  . Asthma 02/02/2015  . Fibrocystic breast disease 11/26/2012    Past Surgical History:  Procedure Laterality Date  . ABDOMINAL HYSTERECTOMY  1994  . BREAST CYST ASPIRATION Right 1997?   breast aspiration d/t infected milk gland  . BREAST MASS EXCISION Left 2011  . COLONOSCOPY  2005   done by Dr. Jamal Collin  . COLONOSCOPY N/A 01/27/2015   Procedure: COLONOSCOPY;  Surgeon: Christene Lye, MD;  Location: [facility] ENDOSCOPY;  Service: Endoscopy;  Laterality: N/A;  . DILATION AND CURETTAGE OF UTERUS  2007  .  FOOT SURGERY Right   . LIPOMA EXCISION  2010   breast bone  . SKIN CANCER EXCISION Left 12/2016   under left eye, [facility]  . TONGUE BIOPSY      Family History        Family Status  Relation Name Status  . Mother  Deceased at age 44  . Other Paternal Cousin Alive  . Father  Deceased  . Sister  Alive  . Brother  Alive  . Mat Uncle  (Not Specified)  . Daughter  Alive        Her family history includes Breast cancer in her other; Diabetes in her  father; Healthy in her brother and sister; Liver cancer in her mother; Lung cancer in her maternal uncle; Thyroid disease in her mother.     Allergies  Allergen Reactions  . Shellfish Allergy Other (See Comments)    Throat closes  . Sulfa Antibiotics Swelling  . Neosporin [Neomycin-Bacitracin Zn-Polymyx] Rash     Current Outpatient Medications:  .  Ascorbic Acid (VITAMIN C PO), Take by mouth., Disp: , Rfl:  .  aspirin 81 MG tablet, Take 81 mg by mouth daily., Disp: , Rfl:  .  beclomethasone (BECONASE-AQ) 42 MCG/SPRAY nasal spray, Place 2 sprays into both nostrils daily. Dose is for each nostril., Disp: 25 g, Rfl: 5 .  budesonide-formoterol (SYMBICORT) 160-4.5 MCG/ACT inhaler, Inhale 2 puffs into the lungs 2 (two) times daily., Disp: 10.2 g, Rfl: 5 .  calcium-vitamin D (OSCAL 500/200 D-3) 500-200 MG-UNIT tablet, Take by mouth., Disp: , Rfl:  .  COMBIVENT RESPIMAT 20-100 MCG/ACT AERS respimat, USE 1 PUFF EVERY 4 HOURS AS NEEDED FOR WHEEZING, Disp: 4 Inhaler, Rfl: 3 .  escitalopram (LEXAPRO) 10 MG tablet, Take one-half tablets (5 mg total) by mouth daily., Disp: 45 tablet, Rfl: 1 .  estrogen-methylTESTOSTERone (EST ESTROGENS-METHYLTEST HS) 0.625-1.25 MG tablet, TAKE ONE (1) TABLET 4-5 days per week, Disp: 30 tablet, Rfl: 5 .  Fish Oil OIL, by Does not apply route as directed., Disp: , Rfl:  .  levothyroxine (SYNTHROID, LEVOTHROID) 25 MCG tablet, Take 1 tablet by mouth daily. 2 on sunday, Disp: , Rfl:  .  loratadine (CLARITIN) 10 MG tablet, Take 10 mg by mouth daily., Disp: , Rfl:  .  metroNIDAZOLE (METROCREAM) 0.75 % cream, Apply 1 application topically daily., Disp: , Rfl:  .  Multiple Vitamin (MULTIVITAMIN) capsule, Take 1 capsule by mouth daily., Disp: , Rfl:  .  zafirlukast (ACCOLATE) 20 MG tablet, TAKE ONE TABLET TWICE DAILY, Disp: 60 tablet, Rfl: 5 .  meprobamate (EQUANIL) 400 MG tablet, Take 400 mg by mouth as needed for pain., Disp: , Rfl:    Patient Care Team: Mar Daring,  PA-C as PCP - General (Family Medicine) Christene Lye, MD (General Surgery) Margarita Rana, MD as Referring Physician (Family Medicine)      Objective:   Vitals: BP 120/80 (BP Location: Left Arm, Patient Position: Sitting, Cuff Size: Large)   Pulse 64   Temp 98.4 F (36.9 C) (Oral)   Resp 16   Ht 5\' 4"  (1.626 m)   Wt 180 lb (81.6 kg)   SpO2 98%   BMI 30.90 kg/m    Physical Exam  Constitutional: She is oriented to person, place, and time. She appears well-developed and well-nourished. No distress.  HENT:  Head: Normocephalic and atraumatic.  Right Ear: Hearing, tympanic membrane, external ear and ear canal normal.  Left Ear: Hearing, tympanic membrane, external ear and ear canal  normal.  Nose: Nose normal.  Mouth/Throat: Uvula is midline, oropharynx is clear and moist and mucous membranes are normal. No oropharyngeal exudate.  Eyes: Conjunctivae and EOM are normal. Pupils are equal, round, and reactive to light. Right eye exhibits no discharge. Left eye exhibits no discharge. No scleral icterus.  Neck: Normal range of motion. Neck supple. No JVD present. Carotid bruit is not present. No tracheal deviation present. No thyromegaly present.  Cardiovascular: Normal rate, regular rhythm, normal heart sounds and intact distal pulses. Exam reveals no gallop and no friction rub.  No murmur heard. Pulmonary/Chest: Effort normal and breath sounds normal. No respiratory distress. She has no wheezes. She has no rales. She exhibits no tenderness. Right breast exhibits no inverted nipple, no mass, no nipple discharge, no skin change and no tenderness. Left breast exhibits no inverted nipple, no mass, no nipple discharge, no skin change and no tenderness. Breasts are symmetrical.  Abdominal: Soft. Bowel sounds are normal. She exhibits no distension and no mass. There is no tenderness. There is no rebound and no guarding.  Musculoskeletal: Normal range of motion. She exhibits no edema or  tenderness.  Lymphadenopathy:    She has no cervical adenopathy.  Neurological: She is alert and oriented to person, place, and time.  Skin: Skin is warm and dry. No rash noted. She is not diaphoretic.  Psychiatric: She has a normal mood and affect. Her behavior is normal. Judgment and thought content normal.  Vitals reviewed.    Depression Screen PHQ 2/9 Scores 06/01/2017 09/05/2016 05/31/2016 05/28/2015  PHQ - 2 Score 0 2 0 0  PHQ- 9 Score 0 3 - -      Assessment & Plan:     Routine Health Maintenance and Physical Exam  Exercise Activities and Dietary recommendations Goals    None      Immunization History  Administered Date(s) Administered  . Influenza, High Dose Seasonal PF 04/26/2015, 04/22/2016, 04/19/2017  . Pneumococcal Conjugate-13 05/22/2014  . Pneumococcal Polysaccharide-23 05/20/2012  . Td 03/14/2004  . Tdap 04/21/2011  . Zoster 12/02/2012    Health Maintenance  Topic Date Due  . MAMMOGRAM  12/05/2018  . COLONOSCOPY  01/27/2020  . TETANUS/TDAP  04/20/2021  . INFLUENZA VACCINE  Completed  . DEXA SCAN  Completed  . Hepatitis C Screening  Completed  . PNA vac Low Risk Adult  Completed     Discussed health benefits of physical activity, and encouraged her to engage in regular exercise appropriate for her age and condition.   1. Annual physical exam Normal physical exam today. Will check labs as below and f/u pending lab results. If labs are stable and WNL she will not need to have these rechecked for one year at her next annual physical exam. She is to call the office in the meantime if she has any acute issue, questions or concerns.  2. Bladder cancer screening UA normal.  - POCT Urinalysis Dipstick  3. Elevated LDL cholesterol level Will check labs as below and f/u pending results. - CBC with Differential/Platelet - Comprehensive metabolic panel - Lipid panel  4. Acquired hypothyroidism Will check labs as below and f/u pending results. -  TSH  5. Blood pressure elevated without history of HTN Normal today.   6. Family history of diabetes mellitus Will check labs as below and f/u pending results. - HgB A1c  7. Need for hepatitis C screening test - Hepatitis C antibody  --------------------------------------------------------------------    Mar Daring, PA-C  [facility]  [facility]

## 2017-06-02 LAB — CBC WITH DIFFERENTIAL/PLATELET
Basophils Absolute: 52 cells/uL (ref 0–200)
Basophils Relative: 0.9 %
EOS ABS: 128 {cells}/uL (ref 15–500)
EOS PCT: 2.2 %
HEMATOCRIT: 41.8 % (ref 35.0–45.0)
HEMOGLOBIN: 14.2 g/dL (ref 11.7–15.5)
LYMPHS ABS: 1665 {cells}/uL (ref 850–3900)
MCH: 31.1 pg (ref 27.0–33.0)
MCHC: 34 g/dL (ref 32.0–36.0)
MCV: 91.5 fL (ref 80.0–100.0)
MPV: 11.8 fL (ref 7.5–12.5)
Monocytes Relative: 10.7 %
NEUTROS ABS: 3335 {cells}/uL (ref 1500–7800)
NEUTROS PCT: 57.5 %
Platelets: 244 10*3/uL (ref 140–400)
RBC: 4.57 10*6/uL (ref 3.80–5.10)
RDW: 11.9 % (ref 11.0–15.0)
Total Lymphocyte: 28.7 %
WBC mixed population: 621 cells/uL (ref 200–950)
WBC: 5.8 10*3/uL (ref 3.8–10.8)

## 2017-06-02 LAB — COMPLETE METABOLIC PANEL WITH GFR
AG RATIO: 1.7 (calc) (ref 1.0–2.5)
ALBUMIN MSPROF: 4.2 g/dL (ref 3.6–5.1)
ALKALINE PHOSPHATASE (APISO): 69 U/L (ref 33–130)
ALT: 17 U/L (ref 6–29)
AST: 19 U/L (ref 10–35)
BILIRUBIN TOTAL: 0.7 mg/dL (ref 0.2–1.2)
BUN: 15 mg/dL (ref 7–25)
CHLORIDE: 104 mmol/L (ref 98–110)
CO2: 31 mmol/L (ref 20–32)
Calcium: 9.3 mg/dL (ref 8.6–10.4)
Creat: 0.93 mg/dL (ref 0.60–0.93)
GFR, EST AFRICAN AMERICAN: 72 mL/min/{1.73_m2} (ref >=60)
GFR, Est Non African American: 62 mL/min/{1.73_m2} (ref >=60)
GLOBULIN: 2.5 g/dL (ref 1.9–3.7)
GLUCOSE: 86 mg/dL (ref 65–99)
POTASSIUM: 4.5 mmol/L (ref 3.5–5.3)
SODIUM: 140 mmol/L (ref 135–146)
TOTAL PROTEIN: 6.7 g/dL (ref 6.1–8.1)

## 2017-06-02 LAB — HEMOGLOBIN A1C
Hgb A1c MFr Bld: 5.4 % of total Hgb (ref <5.7)
Mean Plasma Glucose: 108 (calc)
eAG (mmol/L): 6 (calc)

## 2017-06-02 LAB — LIPID PANEL
CHOL/HDL RATIO: 3.4 (calc) (ref <5.0)
Cholesterol: 198 mg/dL (ref <200)
HDL: 58 mg/dL (ref >50)
LDL Cholesterol (Calc): 121 mg/dL (calc) — ABNORMAL HIGH
NON-HDL CHOLESTEROL (CALC): 140 mg/dL — AB (ref <130)
Triglycerides: 88 mg/dL (ref <150)

## 2017-06-02 LAB — TSH: TSH: 1.16 m[IU]/L (ref 0.40–4.50)

## 2017-06-02 LAB — HEPATITIS C ANTIBODY
HEP C AB: NONREACTIVE
SIGNAL TO CUT-OFF: 0.01 (ref <1.00)

## 2017-06-05 ENCOUNTER — Telehealth: Payer: Self-pay

## 2017-06-05 NOTE — Telephone Encounter (Signed)
Patient advised as below.  

## 2017-06-05 NOTE — Telephone Encounter (Signed)
-----   Message from Mar Daring, PA-C sent at 06/05/2017  8:32 AM EST ----- All labs are within normal limits and stable.  Thanks! -JB

## 2017-06-18 ENCOUNTER — Other Ambulatory Visit: Payer: Self-pay | Admitting: Physician Assistant

## 2017-06-18 DIAGNOSIS — J45909 Unspecified asthma, uncomplicated: Secondary | ICD-10-CM

## 2017-06-29 ENCOUNTER — Ambulatory Visit: Payer: BLUE CROSS/BLUE SHIELD | Admitting: Physician Assistant

## 2017-06-29 ENCOUNTER — Encounter: Payer: Self-pay | Admitting: Physician Assistant

## 2017-06-29 VITALS — BP 158/90 | HR 68 | Temp 98.3°F | Resp 16 | Wt 182.0 lb

## 2017-06-29 DIAGNOSIS — J069 Acute upper respiratory infection, unspecified: Secondary | ICD-10-CM | POA: Diagnosis not present

## 2017-06-29 DIAGNOSIS — J4541 Moderate persistent asthma with (acute) exacerbation: Secondary | ICD-10-CM | POA: Diagnosis not present

## 2017-06-29 DIAGNOSIS — J040 Acute laryngitis: Secondary | ICD-10-CM | POA: Diagnosis not present

## 2017-06-29 MED ORDER — PREDNISONE 10 MG (21) PO TBPK: ORAL_TABLET | ORAL | 0 refills | Status: DC

## 2017-06-29 MED ORDER — AZITHROMYCIN 250 MG PO TABS: ORAL_TABLET | ORAL | 0 refills | Status: DC

## 2017-06-29 NOTE — Progress Notes (Signed)
Patient: Meredith Scott Female    DOB: 1946-03-08   71 y.o.   MRN: [id number] Visit Date: 06/29/2017  Today's Provider: Mar Daring, PA-C   Chief Complaint  Patient presents with  . Cough   Subjective:    HPI Upper Respiratory Infection: Patient complains of symptoms of a URI. Symptoms include congestion and cough. Onset of symptoms was 1 week ago, gradually worsening since that time. She also c/o Hoarsness for the past 2 days .  She is drinking plenty of fluids. Evaluation to date: none. Treatment to date: inhalers.     Allergies  Allergen Reactions  . Shellfish Allergy Other (See Comments)    Throat closes  . Sulfa Antibiotics Swelling  . Neosporin [Neomycin-Bacitracin Zn-Polymyx] Rash     Current Outpatient Medications:  .  Ascorbic Acid (VITAMIN C PO), Take by mouth., Disp: , Rfl:  .  aspirin 81 MG tablet, Take 81 mg by mouth daily., Disp: , Rfl:  .  beclomethasone (BECONASE-AQ) 42 MCG/SPRAY nasal spray, Place 2 sprays into both nostrils daily. Dose is for each nostril., Disp: 25 g, Rfl: 5 .  calcium-vitamin D (OSCAL 500/200 D-3) 500-200 MG-UNIT tablet, Take by mouth., Disp: , Rfl:  .  COMBIVENT RESPIMAT 20-100 MCG/ACT AERS respimat, USE 1 PUFF EVERY 4 HOURS AS NEEDED FOR WHEEZING, Disp: 4 Inhaler, Rfl: 3 .  escitalopram (LEXAPRO) 10 MG tablet, Take one-half tablets (5 mg total) by mouth daily., Disp: 45 tablet, Rfl: 1 .  estrogen-methylTESTOSTERone (EST ESTROGENS-METHYLTEST HS) 0.625-1.25 MG tablet, TAKE ONE (1) TABLET 4-5 days per week, Disp: 30 tablet, Rfl: 5 .  Fish Oil OIL, by Does not apply route as directed., Disp: , Rfl:  .  levothyroxine (SYNTHROID, LEVOTHROID) 25 MCG tablet, Take 1 tablet by mouth daily. 2 on sunday, Disp: , Rfl:  .  loratadine (CLARITIN) 10 MG tablet, Take 10 mg by mouth daily., Disp: , Rfl:  .  meprobamate (EQUANIL) 400 MG tablet, Take 400 mg by mouth as needed for pain., Disp: , Rfl:  .  metroNIDAZOLE (METROCREAM) 0.75 %  cream, Apply 1 application topically daily., Disp: , Rfl:  .  Multiple Vitamin (MULTIVITAMIN) capsule, Take 1 capsule by mouth daily., Disp: , Rfl:  .  SYMBICORT 160-4.5 MCG/ACT inhaler, USE 2 PUFFS TWICE DAILY, Disp: 10.2 Inhaler, Rfl: 4 .  zafirlukast (ACCOLATE) 20 MG tablet, TAKE ONE TABLET TWICE DAILY, Disp: 60 tablet, Rfl: 5  Review of Systems  Constitutional: Negative.   HENT: Positive for congestion, postnasal drip, sinus pressure, sore throat and voice change. Negative for ear pain, rhinorrhea, sinus pain, tinnitus and trouble swallowing.   Respiratory: Positive for cough, chest tightness, shortness of breath and wheezing.   Cardiovascular: Negative.   Gastrointestinal: Negative for abdominal pain.  Neurological: Negative for dizziness and headaches.    Social History   Tobacco Use  . Smoking status: Never Smoker  . Smokeless tobacco: Never Used  Substance Use Topics  . Alcohol use: No   Objective:   BP (!) 158/90 (BP Location: Left Arm, Patient Position: Sitting, Cuff Size: Large)   Pulse 68   Temp 98.3 F (36.8 C) (Oral)   Resp 16   Wt 182 lb (82.6 kg)   SpO2 98%   BMI 31.24 kg/m  Vitals:   06/29/17 1337  BP: (!) 158/90  Pulse: 68  Resp: 16  Temp: 98.3 F (36.8 C)  TempSrc: Oral  SpO2: 98%  Weight: 182 lb (82.6 kg)  Physical Exam  Constitutional: She appears well-developed and well-nourished. No distress.  HENT:  Head: Normocephalic and atraumatic.  Right Ear: Hearing, tympanic membrane, external ear and ear canal normal.  Left Ear: Hearing, tympanic membrane, external ear and ear canal normal.  Nose: Nose normal.  Mouth/Throat: Uvula is midline, oropharynx is clear and moist and mucous membranes are normal. No oropharyngeal exudate.  Eyes: Conjunctivae are normal. Pupils are equal, round, and reactive to light. Right eye exhibits no discharge. Left eye exhibits no discharge. No scleral icterus.  Neck: Normal range of motion. Neck supple. No tracheal  deviation present. No thyromegaly present.  Cardiovascular: Normal rate, regular rhythm and normal heart sounds. Exam reveals no gallop and no friction rub.  No murmur heard. Pulmonary/Chest: Effort normal. No stridor. No respiratory distress. She has decreased breath sounds. She has wheezes in the right lower field and the left lower field. She has no rhonchi. She has no rales.  Lymphadenopathy:    She has no cervical adenopathy.  Skin: Skin is warm and dry. She is not diaphoretic.  Vitals reviewed.      Assessment & Plan:     1. Upper respiratory tract infection, unspecified type Will treat with zpak as below and prednisone to add on if needed. Continue inhalers as prescribed. Discussed voice rest for laryngitis, making sure to swish after inhaler use, keeping well hydrated, and salt water gargles. She is to call if symptoms worsen.  - azithromycin (ZITHROMAX) 250 MG tablet; Take 2 tablets PO on day one, and one tablet PO daily thereafter until completed.  Dispense: 6 tablet; Refill: 0 - predniSONE (STERAPRED UNI-PAK 21 TAB) 10 MG (21) TBPK tablet; Take as directed on package instructions; 6 day taper  Dispense: 21 tablet; Refill: 0  2. Moderate persistent asthma with acute exacerbation See above medical treatment plan. - azithromycin (ZITHROMAX) 250 MG tablet; Take 2 tablets PO on day one, and one tablet PO daily thereafter until completed.  Dispense: 6 tablet; Refill: 0 - predniSONE (STERAPRED UNI-PAK 21 TAB) 10 MG (21) TBPK tablet; Take as directed on package instructions; 6 day taper  Dispense: 21 tablet; Refill: 0  3. Laryngitis See above medical treatment plan.       Mar Daring, PA-C  [facility]

## 2017-07-02 ENCOUNTER — Telehealth: Payer: Self-pay | Admitting: Physician Assistant

## 2017-07-02 DIAGNOSIS — J45909 Unspecified asthma, uncomplicated: Secondary | ICD-10-CM

## 2017-07-02 NOTE — Telephone Encounter (Signed)
[facility] faxed refill request for the following medications:  zafirlukast (ACCOLATE) 20 MG tablet  Last Rx: 02/02/15 Dr. Venia Minks LOV: 06/01/17 Please advise. Thanks TNP

## 2017-07-03 ENCOUNTER — Other Ambulatory Visit: Payer: Self-pay | Admitting: Physician Assistant

## 2017-07-03 DIAGNOSIS — J45909 Unspecified asthma, uncomplicated: Secondary | ICD-10-CM

## 2017-07-03 MED ORDER — ZAFIRLUKAST 20 MG PO TABS: 20.0000 mg | ORAL_TABLET | Freq: Two times a day (BID) | ORAL | 1 refills | Status: DC

## 2017-07-03 NOTE — Telephone Encounter (Signed)
Last ov 06/29/17 Last filled 02/02/15 Please review. Thank you. sd

## 2017-07-03 NOTE — Telephone Encounter (Signed)
refilled 

## 2017-09-24 ENCOUNTER — Ambulatory Visit (INDEPENDENT_AMBULATORY_CARE_PROVIDER_SITE_OTHER): Payer: Medicare HMO | Admitting: Physician Assistant

## 2017-09-24 ENCOUNTER — Encounter: Payer: Self-pay | Admitting: Physician Assistant

## 2017-09-24 VITALS — BP 130/68 | HR 77 | Temp 98.2°F | Resp 16 | Wt 182.8 lb

## 2017-09-24 DIAGNOSIS — J069 Acute upper respiratory infection, unspecified: Secondary | ICD-10-CM

## 2017-09-24 DIAGNOSIS — J040 Acute laryngitis: Secondary | ICD-10-CM

## 2017-09-24 DIAGNOSIS — J4541 Moderate persistent asthma with (acute) exacerbation: Secondary | ICD-10-CM

## 2017-09-24 MED ORDER — AZITHROMYCIN 250 MG PO TABS: ORAL_TABLET | ORAL | 0 refills | Status: DC

## 2017-09-24 MED ORDER — BENZONATATE 200 MG PO CAPS: 200.0000 mg | ORAL_CAPSULE | Freq: Two times a day (BID) | ORAL | 0 refills | Status: DC | PRN

## 2017-09-24 NOTE — Patient Instructions (Signed)

## 2017-09-24 NOTE — Progress Notes (Signed)
Patient: Meredith Scott Female    DOB: 19-Jun-1946   72 y.o.   MRN: [id number] Visit Date: 09/24/2017  Today's Provider: Mar Daring, PA-C   Chief Complaint  Patient presents with  . URI  . Laryngitis   Subjective:    URI   This is a new problem. The current episode started 1 to 4 weeks ago (sick since last tuesday). The problem has been gradually worsening. There has been no fever. Associated symptoms include congestion, coughing, ear pain (Left), headaches, rhinorrhea and a sore throat. Pertinent negatives include no abdominal pain, chest pain or wheezing. She has tried decongestant, increased fluids and acetaminophen (Mucinex,Zafirlukast,Delsym) for the symptoms. The treatment provided no relief.      Allergies  Allergen Reactions  . Shellfish Allergy Other (See Comments)    Throat closes  . Sulfa Antibiotics Swelling  . Neosporin [Neomycin-Bacitracin Zn-Polymyx] Rash     Current Outpatient Medications:  .  Ascorbic Acid (VITAMIN C PO), Take by mouth., Disp: , Rfl:  .  aspirin 81 MG tablet, Take 81 mg by mouth daily., Disp: , Rfl:  .  beclomethasone (BECONASE-AQ) 42 MCG/SPRAY nasal spray, Place 2 sprays into both nostrils daily. Dose is for each nostril., Disp: 25 g, Rfl: 5 .  calcium-vitamin D (OSCAL 500/200 D-3) 500-200 MG-UNIT tablet, Take by mouth., Disp: , Rfl:  .  COMBIVENT RESPIMAT 20-100 MCG/ACT AERS respimat, USE 1 PUFF EVERY 4 HOURS AS NEEDED FOR WHEEZING, Disp: 4 Inhaler, Rfl: 3 .  escitalopram (LEXAPRO) 10 MG tablet, Take one-half tablets (5 mg total) by mouth daily., Disp: 45 tablet, Rfl: 1 .  estrogen-methylTESTOSTERone (EST ESTROGENS-METHYLTEST HS) 0.625-1.25 MG tablet, TAKE ONE (1) TABLET 4-5 days per week, Disp: 30 tablet, Rfl: 5 .  Fish Oil OIL, by Does not apply route as directed., Disp: , Rfl:  .  levothyroxine (SYNTHROID, LEVOTHROID) 25 MCG tablet, Take 1 tablet by mouth daily. 2 on sunday, Disp: , Rfl:  .  loratadine (CLARITIN) 10 MG  tablet, Take 10 mg by mouth daily., Disp: , Rfl:  .  metroNIDAZOLE (METROCREAM) 0.75 % cream, Apply 1 application topically daily., Disp: , Rfl:  .  Multiple Vitamin (MULTIVITAMIN) capsule, Take 1 capsule by mouth daily., Disp: , Rfl:  .  SYMBICORT 160-4.5 MCG/ACT inhaler, USE 2 PUFFS TWICE DAILY, Disp: 10.2 Inhaler, Rfl: 4 .  zafirlukast (ACCOLATE) 20 MG tablet, Take 1 tablet (20 mg total) by mouth 2 (two) times daily., Disp: 180 tablet, Rfl: 1 .  azithromycin (ZITHROMAX) 250 MG tablet, Take 2 tablets PO on day one, and one tablet PO daily thereafter until completed. (Patient not taking: Reported on 09/24/2017), Disp: 6 tablet, Rfl: 0 .  meprobamate (EQUANIL) 400 MG tablet, Take 400 mg by mouth as needed for pain., Disp: , Rfl:  .  predniSONE (STERAPRED UNI-PAK 21 TAB) 10 MG (21) TBPK tablet, Take as directed on package instructions; 6 day taper (Patient not taking: Reported on 09/24/2017), Disp: 21 tablet, Rfl: 0  Review of Systems  Constitutional: Negative for fever.  HENT: Positive for congestion, ear pain (Left), postnasal drip, rhinorrhea, sore throat and voice change. Negative for trouble swallowing.   Eyes: Negative for visual disturbance.  Respiratory: Positive for cough. Negative for chest tightness, shortness of breath and wheezing.   Cardiovascular: Negative for chest pain, palpitations and leg swelling.  Gastrointestinal: Negative for abdominal pain.  Neurological: Positive for headaches.    Social History   Tobacco Use  . Smoking  status: Never Smoker  . Smokeless tobacco: Never Used  Substance Use Topics  . Alcohol use: No   Objective:   BP 130/68 (BP Location: Left Arm, Patient Position: Sitting, Cuff Size: Normal)   Pulse 77   Temp 98.2 F (36.8 C) (Oral)   Resp 16   Wt 182 lb 12.8 oz (82.9 kg)   SpO2 96%   BMI 31.38 kg/m    Physical Exam  Constitutional: She appears well-developed and well-nourished. No distress.  HENT:  Head: Normocephalic and atraumatic.    Right Ear: Hearing, tympanic membrane, external ear and ear canal normal.  Left Ear: Hearing, tympanic membrane, external ear and ear canal normal.  Nose: Nose normal.  Mouth/Throat: Uvula is midline, oropharynx is clear and moist and mucous membranes are normal. No oropharyngeal exudate.  Eyes: Conjunctivae are normal. Pupils are equal, round, and reactive to light. Right eye exhibits no discharge. Left eye exhibits no discharge. No scleral icterus.  Neck: Normal range of motion. Neck supple. No tracheal deviation present. No thyromegaly present.  Cardiovascular: Normal rate, regular rhythm and normal heart sounds. Exam reveals no gallop and no friction rub.  No murmur heard. Pulmonary/Chest: Effort normal. No stridor. No respiratory distress. She has wheezes in the left middle field and the left lower field. She has no rales.  Lymphadenopathy:    She has no cervical adenopathy.  Skin: Skin is warm and dry. She is not diaphoretic.  Vitals reviewed.       Assessment & Plan:     1. Upper respiratory tract infection, unspecified type Worsening symptoms that have not responded to OTC medications. Will give azithromycin as below. Tessalon perles for cough. Continue allergy medications. Stay well hydrated and get plenty of rest. Call if no symptom improvement or if symptoms worsen. - azithromycin (ZITHROMAX) 250 MG tablet; Take 2 tablets PO on day one, and one tablet PO daily thereafter until completed.  Dispense: 6 tablet; Refill: 0 - benzonatate (TESSALON) 200 MG capsule; Take 1 capsule (200 mg total) by mouth 2 (two) times daily as needed for cough.  Dispense: 20 capsule; Refill: 0  2. Moderate persistent asthma with acute exacerbation See above medical treatment plan. - azithromycin (ZITHROMAX) 250 MG tablet; Take 2 tablets PO on day one, and one tablet PO daily thereafter until completed.  Dispense: 6 tablet; Refill: 0 - benzonatate (TESSALON) 200 MG capsule; Take 1 capsule (200 mg  total) by mouth 2 (two) times daily as needed for cough.  Dispense: 20 capsule; Refill: 0  3. Laryngitis Salt water gargles and voice rest.        Mar Daring, PA-C  [facility]

## 2017-10-02 ENCOUNTER — Encounter: Payer: Self-pay | Admitting: Physician Assistant

## 2017-10-02 ENCOUNTER — Ambulatory Visit (INDEPENDENT_AMBULATORY_CARE_PROVIDER_SITE_OTHER): Payer: Medicare HMO | Admitting: Physician Assistant

## 2017-10-02 VITALS — BP 122/70 | HR 64 | Temp 98.4°F | Resp 16 | Wt 181.0 lb

## 2017-10-02 DIAGNOSIS — J0140 Acute pansinusitis, unspecified: Secondary | ICD-10-CM | POA: Diagnosis not present

## 2017-10-02 DIAGNOSIS — J4541 Moderate persistent asthma with (acute) exacerbation: Secondary | ICD-10-CM

## 2017-10-02 DIAGNOSIS — J301 Allergic rhinitis due to pollen: Secondary | ICD-10-CM | POA: Diagnosis not present

## 2017-10-02 MED ORDER — AMOXICILLIN-POT CLAVULANATE 875-125 MG PO TABS
1.0000 | ORAL_TABLET | Freq: Two times a day (BID) | ORAL | 0 refills | Status: DC
Start: 2017-10-02 — End: 2017-12-25

## 2017-10-02 MED ORDER — PREDNISONE 10 MG (21) PO TBPK: ORAL_TABLET | ORAL | 0 refills | Status: DC

## 2017-10-02 NOTE — Patient Instructions (Signed)
Nasal Allergies Nasal allergies are a reaction to allergens in the air. Allergens are particles in the air that cause your body to have an allergic reaction. Nasal allergies are not passed from person to person (are not contagious). They cannot be cured, but they can be controlled. What are the causes? Seasonal nasal allergies (hay fever) are caused by pollen allergens that come from grasses, trees, and weeds. Year-round nasal allergies (perennial allergic rhinitis) are caused by allergens such as house dust mites, pet dander, and mold spores. What increases the risk? The following factors may make you more likely to develop this condition:  Having certain health conditions. These include: ? Other types of allergies, such as food allergies. ? Asthma. ? Eczema.  Having a close relative who has allergies or asthma.  Exposure to house dust, pollen, dander, or other allergens at home or at work.  Exposure to air pollution or secondhand smoke when you were a child.  What are the signs or symptoms? Symptoms of this condition include:  Sneezing.  Runny nose or stuffy nose (congestion).  Watery (tearing) eyes.  Itchy eyes, nose, mouth, throat, skin, or other area.  Sore throat.  Headache.  Decreased sense of smell or taste.  Fatigue. This may occur if you have trouble sleeping due to allergies.  Swollen eyelids.  How is this diagnosed? This condition is diagnosed with a medical history and physical exam. Allergy testing may be done to determine exactly what triggers your nasal allergies. How is this treated? There is no cure for nasal allergies. Treatment focuses on controlling your symptoms, and it may include:  Medicines that block allergy symptoms. These may include allergy shots, nasal sprays, and oral antihistamines.  Avoiding the allergen.  Follow these instructions at home:  Avoid the allergen that is causing your symptoms, if possible.  Keep windows closed. If  possible, use air conditioning when pollen counts are high.  Do not use fans in your home.  Do not hang clothes outside to dry.  Wear sunglasses to keep pollen out of your eyes.  Wash your hands right away after you touch household pets.  Take over-the-counter and prescription medicines only as told by your health care provider.  Keep all follow-up visits as told by your health care provider. This is important. Contact a health care provider if:  You have a fever.  You develop a cough that does not go away (is persistent).  You start to wheeze.  Your symptoms do not improve with treatment.  You have thick nasal discharge.  You start to have nosebleeds. Get help right away if:  Your tongue or your lips are swollen.  You have trouble breathing.  You feel light-headed or you feel like you are going to faint.  You have cold sweats. This information is not intended to replace advice given to you by your health care provider. Make sure you discuss any questions you have with your health care provider. Document Released: 07/03/2005 Document Revised: 12/06/2015 Document Reviewed: 01/13/2015 Elsevier Interactive Patient Education  [employer].

## 2017-10-02 NOTE — Progress Notes (Signed)
Patient: Meredith Scott Female    DOB: 1945-07-30   72 y.o.   MRN: [id number] Visit Date: 10/02/2017  Today's Provider: Mar Daring, PA-C   Chief Complaint  Patient presents with  . URI    follow up    Subjective:    URI   The current episode started 1 to 4 weeks ago. Associated symptoms include congestion, coughing, headaches, rhinorrhea and a sore throat. Pertinent negatives include no chest pain, sinus pain or wheezing.   Patient comes in today for a follow up. She was seen in the office on 09/24/2017 and was treated with Zpak and tessalon perles. She reports that her symptoms has improved, but she still has congestion in her chest.     Allergies  Allergen Reactions  . Shellfish Allergy Other (See Comments)    Throat closes  . Sulfa Antibiotics Swelling  . Neosporin [Neomycin-Bacitracin Zn-Polymyx] Rash     Current Outpatient Medications:  .  Ascorbic Acid (VITAMIN C PO), Take by mouth., Disp: , Rfl:  .  aspirin 81 MG tablet, Take 81 mg by mouth daily., Disp: , Rfl:  .  beclomethasone (BECONASE-AQ) 42 MCG/SPRAY nasal spray, Place 2 sprays into both nostrils daily. Dose is for each nostril., Disp: 25 g, Rfl: 5 .  benzonatate (TESSALON) 200 MG capsule, Take 1 capsule (200 mg total) by mouth 2 (two) times daily as needed for cough., Disp: 20 capsule, Rfl: 0 .  calcium-vitamin D (OSCAL 500/200 D-3) 500-200 MG-UNIT tablet, Take by mouth., Disp: , Rfl:  .  COMBIVENT RESPIMAT 20-100 MCG/ACT AERS respimat, USE 1 PUFF EVERY 4 HOURS AS NEEDED FOR WHEEZING, Disp: 4 Inhaler, Rfl: 3 .  escitalopram (LEXAPRO) 10 MG tablet, Take one-half tablets (5 mg total) by mouth daily., Disp: 45 tablet, Rfl: 1 .  estrogen-methylTESTOSTERone (EST ESTROGENS-METHYLTEST HS) 0.625-1.25 MG tablet, TAKE ONE (1) TABLET 4-5 days per week, Disp: 30 tablet, Rfl: 5 .  Fish Oil OIL, by Does not apply route as directed., Disp: , Rfl:  .  levothyroxine (SYNTHROID, LEVOTHROID) 25 MCG tablet, Take 1  tablet by mouth daily. 2 on sunday, Disp: , Rfl:  .  loratadine (CLARITIN) 10 MG tablet, Take 10 mg by mouth daily., Disp: , Rfl:  .  meprobamate (EQUANIL) 400 MG tablet, Take 400 mg by mouth as needed for pain., Disp: , Rfl:  .  metroNIDAZOLE (METROCREAM) 0.75 % cream, Apply 1 application topically daily., Disp: , Rfl:  .  Multiple Vitamin (MULTIVITAMIN) capsule, Take 1 capsule by mouth daily., Disp: , Rfl:  .  SYMBICORT 160-4.5 MCG/ACT inhaler, USE 2 PUFFS TWICE DAILY, Disp: 10.2 Inhaler, Rfl: 4 .  zafirlukast (ACCOLATE) 20 MG tablet, Take 1 tablet (20 mg total) by mouth 2 (two) times daily., Disp: 180 tablet, Rfl: 1 .  azithromycin (ZITHROMAX) 250 MG tablet, Take 2 tablets PO on day one, and one tablet PO daily thereafter until completed. (Patient not taking: Reported on 10/02/2017), Disp: 6 tablet, Rfl: 0 .  predniSONE (STERAPRED UNI-PAK 21 TAB) 10 MG (21) TBPK tablet, Take as directed on package instructions; 6 day taper (Patient not taking: Reported on 09/24/2017), Disp: 21 tablet, Rfl: 0  Review of Systems  Constitutional: Negative.   HENT: Positive for congestion, postnasal drip, rhinorrhea, sinus pressure and sore throat. Negative for sinus pain.   Respiratory: Positive for cough and chest tightness. Negative for shortness of breath and wheezing.   Cardiovascular: Negative for chest pain, palpitations and leg swelling.  Endocrine: Negative.   Allergic/Immunologic: Positive for environmental allergies.  Neurological: Positive for headaches.    Social History   Tobacco Use  . Smoking status: Never Smoker  . Smokeless tobacco: Never Used  Substance Use Topics  . Alcohol use: No   Objective:   BP 122/70 (BP Location: Right Arm, Patient Position: Sitting, Cuff Size: Normal)   Pulse 64   Temp 98.4 F (36.9 C)   Resp 16   Wt 181 lb (82.1 kg)   SpO2 97%   BMI 31.07 kg/m  Vitals:   10/02/17 0941  BP: 122/70  Pulse: 64  Resp: 16  Temp: 98.4 F (36.9 C)  SpO2: 97%  Weight:  181 lb (82.1 kg)     Physical Exam  Constitutional: She appears well-developed and well-nourished. No distress.  HENT:  Head: Normocephalic and atraumatic.  Right Ear: Hearing, tympanic membrane, external ear and ear canal normal.  Left Ear: Hearing, tympanic membrane, external ear and ear canal normal.  Nose: Nose normal.  Mouth/Throat: Uvula is midline, oropharynx is clear and moist and mucous membranes are normal. No oropharyngeal exudate.  Eyes: Conjunctivae are normal. Pupils are equal, round, and reactive to light. Right eye exhibits no discharge. Left eye exhibits no discharge. No scleral icterus.  Neck: Normal range of motion. Neck supple. No tracheal deviation present. No thyromegaly present.  Cardiovascular: Normal rate, regular rhythm and normal heart sounds. Exam reveals no gallop and no friction rub.  No murmur heard. Pulmonary/Chest: Effort normal and breath sounds normal. No stridor. No respiratory distress. She has no wheezes. She has no rales.  Lymphadenopathy:    She has no cervical adenopathy.  Skin: Skin is warm and dry. She is not diaphoretic.  Vitals reviewed.       Assessment & Plan:     1. Acute pansinusitis, recurrence not specified Suspect allergic rhinitis. Advised patient to start claritin and flonase sensimist. Prescription for augmentin and prednisone printed for patient as she is worried about going on a trip to [place] for 8 days and getting worse. She can fill these if symptoms progress. She is in agreement.  - amoxicillin-clavulanate (AUGMENTIN) 875-125 MG tablet; Take 1 tablet by mouth 2 (two) times daily.  Dispense: 20 tablet; Refill: 0  2. Moderate persistent asthma with acute exacerbation See above medical treatment plan. - predniSONE (STERAPRED UNI-PAK 21 TAB) 10 MG (21) TBPK tablet; Take as directed on package instructions; 6 day taper  Dispense: 21 tablet; Refill: 0  3. Seasonal allergic rhinitis due to pollen See above medical treatment  plan.       Mar Daring, PA-C  [facility]

## 2017-10-15 DIAGNOSIS — E039 Hypothyroidism, unspecified: Secondary | ICD-10-CM | POA: Diagnosis not present

## 2017-10-18 ENCOUNTER — Encounter: Payer: Self-pay | Admitting: General Surgery

## 2017-10-26 DIAGNOSIS — E669 Obesity, unspecified: Secondary | ICD-10-CM | POA: Diagnosis not present

## 2017-10-26 DIAGNOSIS — E039 Hypothyroidism, unspecified: Secondary | ICD-10-CM | POA: Diagnosis not present

## 2017-10-26 DIAGNOSIS — R5383 Other fatigue: Secondary | ICD-10-CM | POA: Diagnosis not present

## 2017-11-06 DIAGNOSIS — D2261 Melanocytic nevi of right upper limb, including shoulder: Secondary | ICD-10-CM | POA: Diagnosis not present

## 2017-11-06 DIAGNOSIS — D2272 Melanocytic nevi of left lower limb, including hip: Secondary | ICD-10-CM | POA: Diagnosis not present

## 2017-11-06 DIAGNOSIS — Z08 Encounter for follow-up examination after completed treatment for malignant neoplasm: Secondary | ICD-10-CM | POA: Diagnosis not present

## 2017-11-06 DIAGNOSIS — L820 Inflamed seborrheic keratosis: Secondary | ICD-10-CM | POA: Diagnosis not present

## 2017-11-06 DIAGNOSIS — Z85828 Personal history of other malignant neoplasm of skin: Secondary | ICD-10-CM | POA: Diagnosis not present

## 2017-11-06 DIAGNOSIS — L84 Corns and callosities: Secondary | ICD-10-CM | POA: Diagnosis not present

## 2017-11-06 DIAGNOSIS — D485 Neoplasm of uncertain behavior of skin: Secondary | ICD-10-CM | POA: Diagnosis not present

## 2017-11-06 DIAGNOSIS — D2271 Melanocytic nevi of right lower limb, including hip: Secondary | ICD-10-CM | POA: Diagnosis not present

## 2017-11-06 DIAGNOSIS — D225 Melanocytic nevi of trunk: Secondary | ICD-10-CM | POA: Diagnosis not present

## 2017-11-06 DIAGNOSIS — D2262 Melanocytic nevi of left upper limb, including shoulder: Secondary | ICD-10-CM | POA: Diagnosis not present

## 2017-12-11 DIAGNOSIS — Z1231 Encounter for screening mammogram for malignant neoplasm of breast: Secondary | ICD-10-CM | POA: Diagnosis not present

## 2017-12-12 ENCOUNTER — Encounter: Payer: Self-pay | Admitting: General Surgery

## 2017-12-13 ENCOUNTER — Other Ambulatory Visit: Payer: Self-pay | Admitting: Physician Assistant

## 2017-12-13 DIAGNOSIS — F320 Major depressive disorder, single episode, mild: Secondary | ICD-10-CM

## 2017-12-13 DIAGNOSIS — N951 Menopausal and female climacteric states: Secondary | ICD-10-CM

## 2017-12-24 ENCOUNTER — Ambulatory Visit
Admission: RE | Admit: 2017-12-24 | Discharge: 2017-12-24 | Disposition: A | Discharge status: Home / Self Care | Payer: Medicare HMO | Source: Ambulatory Visit | Attending: Physician Assistant | Admitting: Physician Assistant

## 2017-12-24 ENCOUNTER — Encounter: Payer: Self-pay | Admitting: Physician Assistant

## 2017-12-24 ENCOUNTER — Ambulatory Visit (INDEPENDENT_AMBULATORY_CARE_PROVIDER_SITE_OTHER): Payer: Medicare HMO | Admitting: Physician Assistant

## 2017-12-24 DIAGNOSIS — R319 Hematuria, unspecified: Secondary | ICD-10-CM | POA: Diagnosis not present

## 2017-12-24 DIAGNOSIS — R3989 Other symptoms and signs involving the genitourinary system: Secondary | ICD-10-CM | POA: Diagnosis not present

## 2017-12-24 DIAGNOSIS — K5900 Constipation, unspecified: Secondary | ICD-10-CM | POA: Insufficient documentation

## 2017-12-24 DIAGNOSIS — R1030 Lower abdominal pain, unspecified: Secondary | ICD-10-CM | POA: Diagnosis not present

## 2017-12-24 DIAGNOSIS — M47816 Spondylosis without myelopathy or radiculopathy, lumbar region: Secondary | ICD-10-CM | POA: Diagnosis not present

## 2017-12-24 DIAGNOSIS — K5790 Diverticulosis of intestine, part unspecified, without perforation or abscess without bleeding: Secondary | ICD-10-CM | POA: Diagnosis not present

## 2017-12-24 DIAGNOSIS — R300 Dysuria: Secondary | ICD-10-CM

## 2017-12-24 LAB — POCT URINALYSIS DIPSTICK
BILIRUBIN UA: NEGATIVE
Glucose, UA: NEGATIVE
KETONES UA: NEGATIVE
Leukocytes, UA: NEGATIVE
NITRITE UA: NEGATIVE
PH UA: 6.5 (ref 5.0–8.0)
Protein, UA: NEGATIVE
Spec Grav, UA: >=1.03 — AB (ref 1.010–1.025)
Urobilinogen, UA: 0.2 E.U./dL

## 2017-12-24 NOTE — Patient Instructions (Signed)

## 2017-12-24 NOTE — Progress Notes (Signed)
Patient: Meredith Scott Female    DOB: 1946/02/26   72 y.o.   MRN: [id number] Visit Date: 12/24/2017  Today's Provider: Mar Daring, PA-C   Chief Complaint  Patient presents with  . Urinary Tract Infection   Subjective:    HPI Patient with c/o feeling fatigue, lower back pain, cramping on lower abdominal pain since last Thursday. She reports she just doesn't feel right. "Feels like her insides are going to come out" Reports that she has noticed her urine is more darker". Denies:Dysuria, burning, no hematuria, no itching.  Patient reports that she had "constipation" 3 weeks ago with a lot of cramping on her lower abdomen and it got better until this past few days that she is having pressure/weird feeling in lower abdomen with lower back pain. She reports that she has also notice that when she stands up she feels like she is not able to stand up straight because of the pressure. Patient had a Hysterectomy in 1994.     Allergies  Allergen Reactions  . Shellfish Allergy Other (See Comments)    Throat closes  . Sulfa Antibiotics Swelling  . Neosporin [Neomycin-Bacitracin Zn-Polymyx] Rash     Current Outpatient Medications:  .  amoxicillin-clavulanate (AUGMENTIN) 875-125 MG tablet, Take 1 tablet by mouth 2 (two) times daily., Disp: 20 tablet, Rfl: 0 .  Ascorbic Acid (VITAMIN C PO), Take by mouth., Disp: , Rfl:  .  aspirin 81 MG tablet, Take 81 mg by mouth daily., Disp: , Rfl:  .  beclomethasone (BECONASE-AQ) 42 MCG/SPRAY nasal spray, Place 2 sprays into both nostrils daily. Dose is for each nostril., Disp: 25 g, Rfl: 5 .  benzonatate (TESSALON) 200 MG capsule, Take 1 capsule (200 mg total) by mouth 2 (two) times daily as needed for cough., Disp: 20 capsule, Rfl: 0 .  calcium-vitamin D (OSCAL 500/200 D-3) 500-200 MG-UNIT tablet, Take by mouth., Disp: , Rfl:  .  COMBIVENT RESPIMAT 20-100 MCG/ACT AERS respimat, USE 1 PUFF EVERY 4 HOURS AS NEEDED FOR WHEEZING, Disp: 4  Inhaler, Rfl: 3 .  escitalopram (LEXAPRO) 10 MG tablet, Take one-half tablets (5 mg total) by mouth daily., Disp: 45 tablet, Rfl: 1 .  estrogen-methylTESTOSTERone (EST ESTROGENS-METHYLTEST HS) 0.625-1.25 MG tablet, TAKE ONE TABLET BY MOUTH FOUR TO FIVE TIMES A WEEK, Disp: 30 tablet, Rfl: 4 .  Fish Oil OIL, by Does not apply route as directed., Disp: , Rfl:  .  levothyroxine (SYNTHROID, LEVOTHROID) 25 MCG tablet, Take 1 tablet by mouth daily. 2 on sunday, Disp: , Rfl:  .  loratadine (CLARITIN) 10 MG tablet, Take 10 mg by mouth daily., Disp: , Rfl:  .  meprobamate (EQUANIL) 400 MG tablet, Take 400 mg by mouth as needed for pain., Disp: , Rfl:  .  metroNIDAZOLE (METROCREAM) 0.75 % cream, Apply 1 application topically daily., Disp: , Rfl:  .  Multiple Vitamin (MULTIVITAMIN) capsule, Take 1 capsule by mouth daily., Disp: , Rfl:  .  predniSONE (STERAPRED UNI-PAK 21 TAB) 10 MG (21) TBPK tablet, Take as directed on package instructions; 6 day taper, Disp: 21 tablet, Rfl: 0 .  SYMBICORT 160-4.5 MCG/ACT inhaler, USE 2 PUFFS TWICE DAILY, Disp: 10.2 Inhaler, Rfl: 4 .  zafirlukast (ACCOLATE) 20 MG tablet, Take 1 tablet (20 mg total) by mouth 2 (two) times daily., Disp: 180 tablet, Rfl: 1  Review of Systems  Constitutional: Negative.  Negative for fever.  Respiratory: Negative.   Cardiovascular: Negative.   Gastrointestinal: Positive for  abdominal pain and constipation. Negative for anal bleeding, blood in stool, diarrhea, nausea, rectal pain and vomiting.  Genitourinary: Positive for dysuria, frequency and pelvic pain.    Social History   Tobacco Use  . Smoking status: Never Smoker  . Smokeless tobacco: Never Used  Substance Use Topics  . Alcohol use: No   Objective:   There were no vitals taken for this visit.   Physical Exam  Constitutional: She is oriented to person, place, and time. She appears well-developed and well-nourished. No distress.  Cardiovascular: Normal rate, regular rhythm and  normal heart sounds. Exam reveals no gallop and no friction rub.  No murmur heard. Pulmonary/Chest: Effort normal and breath sounds normal. No respiratory distress. She has no wheezes. She has no rales.  Abdominal: Soft. Normal appearance and bowel sounds are normal. She exhibits no distension and no mass. There is no hepatosplenomegaly. There is generalized tenderness (LLQ most tender but generalized). There is no rebound, no guarding, no CVA tenderness, no tenderness at McBurney's point and negative Murphy's sign. Hernia confirmed negative in the right inguinal area and confirmed negative in the left inguinal area.  Genitourinary: Vagina normal. There is no rash, tenderness, lesion or injury on the right labia. There is no rash, tenderness, lesion or injury on the left labia. No tenderness in the vagina.  Lymphadenopathy: No inguinal adenopathy noted on the right or left side.  Neurological: She is alert and oriented to person, place, and time.  Skin: Skin is warm and dry. She is not diaphoretic.  Vitals reviewed.      Assessment & Plan:     1. Possible urinary tract infection UA normal with exception of dehydrated and trace hematuria. No history of renal stones. Will send for culture and microscopic eval as below. Push fluids. I was suspecting bladder prolapse causing symptoms but on eval there is not much prolapse noted, if any at all. DDx: UTI, constipation, diverticulitis. I have also ordered xray of abdomen to r/o constipation. I will f/u pending results. She is to call if symptoms worsen.  - POCT urinalysis dipstick  2. Dysuria See above medical treatment plan.  - Urinalysis, microscopic only - Urine Culture - DG Abd 2 Views; Future  3. Hematuria, unspecified type - Urinalysis, microscopic only - DG Abd 2 Views; Future  4. Lower abdominal pain - DG Abd 2 Views; Future  5. Constipation, unspecified constipation type - DG Abd 2 Views; Future  6. Diverticulosis Known history  of diverticula noted on colonoscopy.        Mar Daring, PA-C  [facility]

## 2017-12-25 ENCOUNTER — Telehealth: Payer: Self-pay

## 2017-12-25 ENCOUNTER — Ambulatory Visit (INDEPENDENT_AMBULATORY_CARE_PROVIDER_SITE_OTHER): Payer: Medicare HMO | Admitting: General Surgery

## 2017-12-25 ENCOUNTER — Encounter: Payer: Self-pay | Admitting: General Surgery

## 2017-12-25 VITALS — BP 140/80 | HR 70 | Resp 14 | Ht 63.0 in | Wt 179.0 lb

## 2017-12-25 DIAGNOSIS — N6012 Diffuse cystic mastopathy of left breast: Secondary | ICD-10-CM | POA: Diagnosis not present

## 2017-12-25 DIAGNOSIS — K5909 Other constipation: Secondary | ICD-10-CM | POA: Diagnosis not present

## 2017-12-25 DIAGNOSIS — N6011 Diffuse cystic mastopathy of right breast: Secondary | ICD-10-CM | POA: Diagnosis not present

## 2017-12-25 DIAGNOSIS — Z86010 Personal history of colonic polyps: Secondary | ICD-10-CM

## 2017-12-25 LAB — URINALYSIS, MICROSCOPIC ONLY: Casts: NONE SEEN /lpf

## 2017-12-25 NOTE — Telephone Encounter (Signed)
Patient advised as below.  

## 2017-12-25 NOTE — Progress Notes (Signed)
Patient ID: Meredith Scott, female   DOB: [birth year], 72 y.o.   MRN: [id number]  Chief Complaint  Patient presents with  . Follow-up    HPI Meredith Scott is a 72 y.o. female who presents for a breast evaluation. The most recent mammogram was done on 12/11/2017 .  Patient does perform regular self breast checks and gets regular mammograms done.  Patient had a x-ray done yesterday, been having some constipation and abdominal pain.This has been going on for three weeks. Uses fiber and stool softness. Moves her bowels every other day. Last colonoscopy 2016.   HPI  Past Medical History:  Diagnosis Date  . Anxiety   . Asthma   . Asymptomatic varicose veins 2011  . Breast screening, unspecified 2013  . Colon polyp   . Diffuse cystic mastopathy 2013  . Hypertension   . Hypothyroidism    Dr Eddie Dibbles  . Obesity, unspecified 2013  . Screening for obesity 2013  . Skin cancer of face 2018   squamous/ left cheek  . Special screening for malignant neoplasms, colon 2013    Past Surgical History:  Procedure Laterality Date  . ABDOMINAL HYSTERECTOMY  1994  . BREAST CYST ASPIRATION Right 1997?   breast aspiration d/t infected milk gland  . BREAST MASS EXCISION Left 2011  . COLONOSCOPY  2005   done by Dr. Jamal Collin  . COLONOSCOPY N/A 01/27/2015   Procedure: COLONOSCOPY;  Surgeon: Christene Lye, MD;  Location: [facility] ENDOSCOPY;  Service: Endoscopy;  Laterality: N/A;  . DILATION AND CURETTAGE OF UTERUS  2007  . FOOT SURGERY Right   . LIPOMA EXCISION  2010   breast bone  . SKIN CANCER EXCISION Left 12/2016   under left eye, [facility]  . TONGUE BIOPSY      Family History  Problem Relation Age of Onset  . Liver cancer Mother   . Thyroid disease Mother   . Breast cancer Other   . Diabetes Father   . Healthy Sister   . Healthy Brother   . Lung cancer Maternal Uncle     Social History Social History   Tobacco Use  . Smoking status: Never Smoker  . Smokeless tobacco:  Never Used  Substance Use Topics  . Alcohol use: No  . Drug use: No    Allergies  Allergen Reactions  . Shellfish Allergy Other (See Comments)    Throat closes  . Sulfa Antibiotics Swelling  . Neosporin [Neomycin-Bacitracin Zn-Polymyx] Rash    Current Outpatient Medications  Medication Sig Dispense Refill  . Ascorbic Acid (VITAMIN C PO) Take by mouth.    Marland Kitchen aspirin 81 MG tablet Take 81 mg by mouth daily.    . beclomethasone (BECONASE-AQ) 42 MCG/SPRAY nasal spray Place 2 sprays into both nostrils daily. Dose is for each nostril. 25 g 5  . calcium-vitamin D (OSCAL 500/200 D-3) 500-200 MG-UNIT tablet Take by mouth.    . COMBIVENT RESPIMAT 20-100 MCG/ACT AERS respimat USE 1 PUFF EVERY 4 HOURS AS NEEDED FOR WHEEZING 4 Inhaler 3  . escitalopram (LEXAPRO) 10 MG tablet Take one-half tablets (5 mg total) by mouth daily. 45 tablet 1  . estrogen-methylTESTOSTERone (EST ESTROGENS-METHYLTEST HS) 0.625-1.25 MG tablet TAKE ONE TABLET BY MOUTH FOUR TO FIVE TIMES A WEEK 30 tablet 4  . Fish Oil OIL by Does not apply route as directed.    Marland Kitchen levothyroxine (SYNTHROID, LEVOTHROID) 25 MCG tablet Take 1 tablet by mouth daily. 2 on sunday    . loratadine (CLARITIN) 10  MG tablet Take 10 mg by mouth daily.    . metroNIDAZOLE (METROCREAM) 0.75 % cream Apply 1 application topically daily.    . Multiple Vitamin (MULTIVITAMIN) capsule Take 1 capsule by mouth daily.    . SYMBICORT 160-4.5 MCG/ACT inhaler USE 2 PUFFS TWICE DAILY 10.2 Inhaler 4  . zafirlukast (ACCOLATE) 20 MG tablet Take 1 tablet (20 mg total) by mouth 2 (two) times daily. 180 tablet 1   No current facility-administered medications for this visit.     Review of Systems Review of Systems  Constitutional: Negative.   Respiratory: Negative.   Cardiovascular: Negative.     Blood pressure 140/80, pulse 70, resp. rate 14, height 5\' 3"  (1.6 m), weight 179 lb (81.2 kg).  Physical Exam Physical Exam  Constitutional: She is oriented to person,  place, and time. She appears well-developed and well-nourished.  Eyes: Conjunctivae are normal. No scleral icterus.  Neck: Neck supple.  Cardiovascular: Normal rate, regular rhythm and normal heart sounds.  Pulmonary/Chest: Effort normal and breath sounds normal. Right breast exhibits no inverted nipple, no mass, no nipple discharge, no skin change and no tenderness. Left breast exhibits no inverted nipple, no mass, no nipple discharge, no skin change and no tenderness. Breasts are asymmetrical (right breast bigger than left. ).  Abdominal: Soft. Normal appearance and bowel sounds are normal. There is tenderness in the left lower quadrant.  Lymphadenopathy:    She has no cervical adenopathy.    She has no axillary adenopathy.  Neurological: She is alert and oriented to person, place, and time.  Skin: Skin is warm and dry.    Data Reviewed Dec 11, 2017 bilateral screening mammograms completed at [facility] reviewed: BI-RADS-2.  Assessment    Stable breast exam.  New onset constipation.  Candidate for follow-up colonoscopy at this time based on polyp greater than 1 cm in 2016.  Plan  We will see how the patient responds in regards to her recent constipation with the regimen noted below:  Patient to use a cap full of Miralax two times a day. Fleet enema if needed. Patient to call on Friday and report.   The review of the pathology was completed after the patient had left the office.  She will be contacted when she makes her phone follow-up on June 14 that a repeat colonoscopy would be appropriate at this time based on the polyp size in 2016.   Patient will be asked to return to the office in one year with a bilateral screening mammogram@ [facility].  HPI, Physical Exam, Assessment and Plan have been scribed under the direction and in the presence of Hervey Ard, MD.  Gaspar Cola, CMA  I have completed the exam and reviewed the above documentation for accuracy and completeness.  I  agree with the above.  Haematologist has been used and any errors in dictation or transcription are unintentional.  Hervey Ard, M.D., F.A.C.S.  Forest Gleason Surabhi Gadea 12/26/2017, 6:39 PM

## 2017-12-25 NOTE — Patient Instructions (Signed)
  Patient to use a cap full of Miralax two times a day. Fleet enema if needed. Patient to call on Friday and report.  Patient will be asked to return to the office in one year with a bilateral screening mammogram@ [facility].

## 2017-12-25 NOTE — Telephone Encounter (Signed)
-----   Message from Mar Daring, [place] sent at 12/24/2017  4:06 PM EDT ----- Abdominal xrays appear normal. No stool burden. No abnormal bowel gas pattern. There is some DDD noted in the lumbar spine. Will await urine culture then go from there with plan.

## 2017-12-26 ENCOUNTER — Telehealth: Payer: Self-pay

## 2017-12-26 DIAGNOSIS — R319 Hematuria, unspecified: Secondary | ICD-10-CM

## 2017-12-26 DIAGNOSIS — Z86010 Personal history of colonic polyps: Secondary | ICD-10-CM | POA: Insufficient documentation

## 2017-12-26 DIAGNOSIS — R1030 Lower abdominal pain, unspecified: Secondary | ICD-10-CM

## 2017-12-26 DIAGNOSIS — K5909 Other constipation: Secondary | ICD-10-CM | POA: Insufficient documentation

## 2017-12-26 LAB — URINE CULTURE

## 2017-12-26 NOTE — Telephone Encounter (Signed)
-----   Message from Mar Daring, PA-C sent at 12/26/2017  8:34 AM EDT ----- Urine culture negative for bacteria. There is some mild hematuria noted on microscopy. If still having pain would recommend Urology referral for the blood in the urine if agreeable.

## 2017-12-26 NOTE — Telephone Encounter (Signed)
Patient advised as below. Patient will call back later on for referral.

## 2017-12-26 NOTE — Telephone Encounter (Signed)
Referral placed.

## 2017-12-28 ENCOUNTER — Telehealth: Payer: Self-pay | Admitting: General Surgery

## 2017-12-28 NOTE — Telephone Encounter (Signed)
Patient called to give report from taking Miralax for the past couple days.She is doing much better. She has a Urology consult in July for the blood found in her urine.

## 2017-12-31 ENCOUNTER — Telehealth: Payer: Self-pay

## 2017-12-31 NOTE — Telephone Encounter (Signed)
Patient notified. She will think about this and get back to [place] once she comes back from vacation in a few week.

## 2017-12-31 NOTE — Telephone Encounter (Signed)
-----   Message from Robert Bellow, MD sent at 12/26/2017  6:44 PM EDT ----- Please notify the patient after reviewing the records from her 2016 colonoscopy the polyp size warranted a follow-up at 3 years rather than 5.  Complete see if she is amenable to have the study completed.  Thank you

## 2018-01-10 DIAGNOSIS — R69 Illness, unspecified: Secondary | ICD-10-CM | POA: Diagnosis not present

## 2018-01-14 DIAGNOSIS — E039 Hypothyroidism, unspecified: Secondary | ICD-10-CM | POA: Diagnosis not present

## 2018-01-21 DIAGNOSIS — E669 Obesity, unspecified: Secondary | ICD-10-CM | POA: Diagnosis not present

## 2018-01-21 DIAGNOSIS — E039 Hypothyroidism, unspecified: Secondary | ICD-10-CM | POA: Diagnosis not present

## 2018-01-28 NOTE — Progress Notes (Signed)
01/29/2018 11:29 AM   Meredith Scott 1945/10/24 [id number]  Referring provider: Mar Daring, PA-C [address] [place], [place] [address]  Chief Complaint  Patient presents with  . Hematuria    HPI: Patient is a 72 -year-old Caucasian female who presents today as a referral from Mar Daring, PA-C for microscopic hematuria.    Patient was found to have microscopic hematuria on 12/24/2017 with 3-10 RBC's/hpf.  Urine culture is negative.  She does not have a prior history of recurrent urinary tract infections, nephrolithiasis, trauma to the genitourinary tract or malignancies of the genitourinary tract.   She does not have a family medical history of nephrolithiasis, malignancies of the genitourinary tract or hematuria.   Today, she is having symptoms of frequent urination x 10 - 12 which she attributes to water intake, nocturia x 2 and stress incontinence since her hysterectomy in 1994.  Her UA today demonstrates negative.    Patient denies any gross hematuria, dysuria or suprapubic/flank pain.  Patient denies any fevers, chills, nausea or vomiting.  She was having suprapubic pain one month ago which she was told was due to constipation.  She is now on MiraLax and the pain has abated.    She is not a smoker.  She is not exposed to secondhand smoke.  She has not worked with Sports administrator, trichloroethylene, etc.   She has HTN.    She has a high BMI.     PMH: Past Medical History:  Diagnosis Date  . Anxiety   . Asthma   . Asymptomatic varicose veins 2011  . Breast screening, unspecified 2013  . Colon polyp   . Diffuse cystic mastopathy 2013  . Hypertension   . Hypothyroidism    Dr Eddie Dibbles  . Obesity, unspecified 2013  . Screening for obesity 2013  . Skin cancer of face 2018   squamous/ left cheek  . Special screening for malignant neoplasms, colon 2013    Surgical History: Past Surgical History:  Procedure Laterality Date  .  ABDOMINAL HYSTERECTOMY  1994  . BREAST CYST ASPIRATION Right 1997?   breast aspiration d/t infected milk gland  . BREAST MASS EXCISION Left 2011  . COLONOSCOPY  2005   done by Dr. Jamal Collin  . COLONOSCOPY N/A 01/27/2015   Procedure: COLONOSCOPY;  Surgeon: Christene Lye, MD;  Location: [facility] ENDOSCOPY;  Service: Endoscopy;  Laterality: N/A;  . DILATION AND CURETTAGE OF UTERUS  2007  . FOOT SURGERY Right   . LIPOMA EXCISION  2010   breast bone  . SKIN CANCER EXCISION Left 12/2016   under left eye, [facility]  . TONGUE BIOPSY      Home Medications:  Allergies as of 01/29/2018      Reactions   Shellfish Allergy Other (See Comments)   Throat closes   Sulfa Antibiotics Swelling   Neosporin [neomycin-bacitracin Zn-polymyx] Rash      Medication List        Accurate as of 01/29/18 11:29 AM. Always use your most recent med list.          aspirin 81 MG tablet Take 81 mg by mouth daily.   beclomethasone 42 MCG/SPRAY nasal spray Commonly known as:  BECONASE-AQ Place 2 sprays into both nostrils daily. Dose is for each nostril.   COMBIVENT RESPIMAT 20-100 MCG/ACT Aers respimat Generic drug:  Ipratropium-Albuterol USE 1 PUFF EVERY 4 HOURS AS NEEDED FOR WHEEZING   diphenhydrAMINE 50 MG tablet Commonly known as:  BENADRYL  Take one tablet one hour prior to CT   escitalopram 10 MG tablet Commonly known as:  LEXAPRO Take one-half tablets (5 mg total) by mouth daily.   estrogen-methylTESTOSTERone 0.625-1.25 MG tablet Commonly known as:  EST ESTROGENS-METHYLTEST HS TAKE ONE TABLET BY MOUTH FOUR TO FIVE TIMES A WEEK   Fish Oil Oil by Does not apply route as directed.   levothyroxine 25 MCG tablet Commonly known as:  SYNTHROID, LEVOTHROID Take 1 tablet by mouth daily. 2 on sunday   loratadine 10 MG tablet Commonly known as:  CLARITIN Take 10 mg by mouth daily.   metroNIDAZOLE 0.75 % cream Commonly known as:  METROCREAM Apply 1 application topically daily.     multivitamin capsule Take 1 capsule by mouth daily.   OSCAL 500/200 D-3 500-200 MG-UNIT tablet Generic drug:  calcium-vitamin D Take by mouth.   predniSONE 50 MG tablet Commonly known as:  DELTASONE Take the one tablet 13 hours, 7 hours and 1 hours prior to the CT Urogram   ranitidine 150 MG tablet Commonly known as:  ZANTAC Take one tablet 1 hour prior to CT Urogram   SYMBICORT 160-4.5 MCG/ACT inhaler Generic drug:  budesonide-formoterol USE 2 PUFFS TWICE DAILY   VITAMIN C PO Take by mouth.   zafirlukast 20 MG tablet Commonly known as:  ACCOLATE Take 1 tablet (20 mg total) by mouth 2 (two) times daily.       Allergies:  Allergies  Allergen Reactions  . Shellfish Allergy Other (See Comments)    Throat closes  . Sulfa Antibiotics Swelling  . Neosporin [Neomycin-Bacitracin Zn-Polymyx] Rash    Family History: Family History  Problem Relation Age of Onset  . Liver cancer Mother   . Thyroid disease Mother   . Breast cancer Other   . Diabetes Father   . Healthy Sister   . Healthy Brother   . Lung cancer Maternal Uncle     Social History:  reports that she has never smoked. She has never used smokeless tobacco. She reports that she does not drink alcohol or use drugs.  ROS: UROLOGY Frequent Urination?: Yes Hard to postpone urination?: No Burning/pain with urination?: No Get up at night to urinate?: Yes Leakage of urine?: Yes Urine stream starts and stops?: No Trouble starting stream?: No Do you have to strain to urinate?: No Blood in urine?: No Urinary tract infection?: No Sexually transmitted disease?: No Injury to kidneys or bladder?: No Painful intercourse?: No Weak stream?: No Currently pregnant?: No Vaginal bleeding?: No Last menstrual period?: hysterectomy  Gastrointestinal Nausea?: No Vomiting?: No Indigestion/heartburn?: No Diarrhea?: No Constipation?: Yes  Constitutional Fever: No Night sweats?: No Weight loss?: No Fatigue?:  No  Skin Skin rash/lesions?: No Itching?: No  Eyes Blurred vision?: No Double vision?: No  Ears/Nose/Throat Sore throat?: No Sinus problems?: No  Hematologic/Lymphatic Swollen glands?: No Easy bruising?: No  Cardiovascular Leg swelling?: No Chest pain?: No  Respiratory Cough?: No Shortness of breath?: No  Endocrine Excessive thirst?: No  Musculoskeletal Back pain?: No Joint pain?: No  Neurological Headaches?: No Dizziness?: No  Psychologic Depression?: No Anxiety?: No  Physical Exam: BP 130/75   Pulse 65   Ht 5' 3.5" (1.613 m)   Wt 180 lb (81.6 kg)   BMI 31.39 kg/m   Constitutional:  Well nourished. Alert and oriented, No acute distress. HEENT: [place] AT, moist mucus membranes.  Trachea midline, no masses. Cardiovascular: No clubbing, cyanosis, or edema. Respiratory: Normal respiratory effort, no increased work of breathing. GI: Abdomen is  soft, non tender, non distended, no abdominal masses. Liver and spleen not palpable.  No hernias appreciated.  Stool sample for occult testing is not indicated.   GU: No CVA tenderness.  No bladder fullness or masses.   Skin: No rashes, bruises or suspicious lesions. Lymph: No cervical or inguinal adenopathy. Neurologic: Grossly intact, no focal deficits, moving all 4 extremities. Psychiatric: Normal mood and affect.  Laboratory Data: Lab Results  Component Value Date   WBC 5.8 06/01/2017   HGB 14.2 06/01/2017   HCT 41.8 06/01/2017   MCV 91.5 06/01/2017   PLT 244 06/01/2017    Lab Results  Component Value Date   CREATININE 0.93 06/01/2017    No results found for: PSA  No results found for: TESTOSTERONE  Lab Results  Component Value Date   HGBA1C 5.4 06/01/2017    Lab Results  Component Value Date   TSH 1.16 06/01/2017       Component Value Date/Time   CHOL 198 06/01/2017 0947   CHOL 211 (H) 06/05/2016 0805   HDL 58 06/01/2017 0947   HDL 54 06/05/2016 0805   CHOLHDL 3.4 06/01/2017 0947    LDLCALC 121 (H) 06/01/2017 0947    Lab Results  Component Value Date   AST 19 06/01/2017   Lab Results  Component Value Date   ALT 17 06/01/2017   No components found for: ALKALINEPHOPHATASE No components found for: BILIRUBINTOTAL  No results found for: ESTRADIOL   Urinalysis See epic and hpi  I have reviewed the labs.   Assessment & Plan:    1. Microscopic hematuria Explained to the patient that there are a number of causes that can be associated with blood in the urine, such as stones, UTI's, damage to the urinary tract and/or cancer. At this time, I felt that the patient warranted further urologic evaluation.   The AUA guidelines state that a CT urogram is the preferred imaging study to evaluate hematuria. I explained to the patient that a contrast material will be injected into a vein and that in rare instances, an allergic reaction can result and may even life threatening   The patient is allergic to shellfish.  Allergic prep is given.  She denies any allergies to contrast iodine and is not taking metformin Following the imaging study,  I've recommended a cystoscopy. I described how this is performed, typically in an office setting with a flexible cystoscope. We described the risks, benefits, and possible side effects, the most common of which is a minor amount of blood in the urine and/or burning which usually resolves in 24 to 48 hours.   The patient had the opportunity to ask questions which were answered. Based upon this discussion, the patient is willing to proceed. Therefore, I've ordered: a CT Urogram and cystoscopy.   - The patient will return following all of the above for discussion of the results.   - UA negative  - Urine culture pending  - BUN + creatinine  pending    Return for CT Urogram report and cystoscopy.  These notes generated with voice recognition software. I apologize for typographical errors.  Zara Council, PA-C  [facility] [address] [place]  [place],  [address] [phone]

## 2018-01-29 ENCOUNTER — Ambulatory Visit: Payer: Medicare HMO | Admitting: Urology

## 2018-01-29 ENCOUNTER — Other Ambulatory Visit: Payer: Self-pay

## 2018-01-29 ENCOUNTER — Encounter: Payer: Self-pay | Admitting: Urology

## 2018-01-29 VITALS — BP 130/75 | HR 65 | Ht 63.5 in | Wt 180.0 lb

## 2018-01-29 DIAGNOSIS — R3129 Other microscopic hematuria: Secondary | ICD-10-CM | POA: Diagnosis not present

## 2018-01-29 LAB — URINALYSIS, COMPLETE
Bilirubin, UA: NEGATIVE
Glucose, UA: NEGATIVE
Ketones, UA: NEGATIVE
Leukocytes, UA: NEGATIVE
Nitrite, UA: NEGATIVE
Protein, UA: NEGATIVE
Specific Gravity, UA: 1.01 (ref 1.005–1.030)
Urobilinogen, Ur: 0.2 mg/dL (ref 0.2–1.0)
pH, UA: 6 (ref 5.0–7.5)

## 2018-01-29 LAB — MICROSCOPIC EXAMINATION
BACTERIA UA: NONE SEEN
WBC, UA: NONE SEEN /hpf (ref 0–5)

## 2018-01-29 MED ORDER — RANITIDINE HCL 150 MG PO TABS: ORAL_TABLET | ORAL | 0 refills | Status: DC

## 2018-01-29 MED ORDER — PREDNISONE 50 MG PO TABS: ORAL_TABLET | ORAL | 0 refills | Status: DC

## 2018-01-29 MED ORDER — DIPHENHYDRAMINE HCL 50 MG PO TABS: ORAL_TABLET | ORAL | 0 refills | Status: DC

## 2018-01-30 LAB — BUN+CREAT
BUN/Creatinine Ratio: 15 (ref 12–28)
BUN: 13 mg/dL (ref 8–27)
CREATININE: 0.86 mg/dL (ref 0.57–1.00)
GFR calc Af Amer: 79 mL/min/{1.73_m2} (ref >59)
GFR calc non Af Amer: 68 mL/min/{1.73_m2} (ref >59)

## 2018-01-31 ENCOUNTER — Telehealth: Payer: Self-pay | Admitting: Physician Assistant

## 2018-01-31 NOTE — Telephone Encounter (Signed)
Meredith Scott with [facility] stated that pt is update to date on her Shingrix vaccine. Meredith Scott stated pt had fist dose on 10/24/17 and final dose 01/31/18. Thanks TNP

## 2018-01-31 NOTE — Telephone Encounter (Signed)
Updated in chart

## 2018-02-01 LAB — CULTURE, URINE COMPREHENSIVE

## 2018-02-07 ENCOUNTER — Telehealth: Payer: Self-pay | Admitting: Physician Assistant

## 2018-02-07 NOTE — Telephone Encounter (Signed)
I called the patient to schedule AWV with [facility].  She wasnt in, so I told Mr. that I would call her back. VDM (DD)

## 2018-02-11 NOTE — Telephone Encounter (Signed)
Scheduled AWV for 05/15/18 @ 8:40 AM  -MM

## 2018-03-08 ENCOUNTER — Ambulatory Visit
Admission: RE | Admit: 2018-03-08 | Discharge: 2018-03-08 | Disposition: A | Discharge status: Home / Self Care | Payer: Medicare HMO | Source: Ambulatory Visit | Attending: Urology | Admitting: Urology

## 2018-03-08 DIAGNOSIS — K769 Liver disease, unspecified: Secondary | ICD-10-CM | POA: Insufficient documentation

## 2018-03-08 DIAGNOSIS — K449 Diaphragmatic hernia without obstruction or gangrene: Secondary | ICD-10-CM | POA: Diagnosis not present

## 2018-03-08 DIAGNOSIS — I700 Atherosclerosis of aorta: Secondary | ICD-10-CM | POA: Diagnosis not present

## 2018-03-08 DIAGNOSIS — K8020 Calculus of gallbladder without cholecystitis without obstruction: Secondary | ICD-10-CM | POA: Insufficient documentation

## 2018-03-08 DIAGNOSIS — R3129 Other microscopic hematuria: Secondary | ICD-10-CM | POA: Insufficient documentation

## 2018-03-08 MED ORDER — IOPAMIDOL (ISOVUE-300) INJECTION 61%
125.0000 mL | Freq: Once | INTRAVENOUS | Status: AC | PRN
  Administered 2018-03-08: 125 mL via INTRAVENOUS

## 2018-03-13 ENCOUNTER — Ambulatory Visit: Payer: Medicare HMO | Admitting: Urology

## 2018-03-13 ENCOUNTER — Encounter: Payer: Self-pay | Admitting: Urology

## 2018-03-13 VITALS — BP 143/80 | HR 71 | Ht 63.0 in | Wt 180.0 lb

## 2018-03-13 DIAGNOSIS — R3129 Other microscopic hematuria: Secondary | ICD-10-CM | POA: Diagnosis not present

## 2018-03-13 DIAGNOSIS — K769 Liver disease, unspecified: Secondary | ICD-10-CM

## 2018-03-13 DIAGNOSIS — K629 Disease of anus and rectum, unspecified: Secondary | ICD-10-CM

## 2018-03-13 DIAGNOSIS — K439 Ventral hernia without obstruction or gangrene: Secondary | ICD-10-CM

## 2018-03-13 LAB — URINALYSIS, COMPLETE
BILIRUBIN UA: NEGATIVE
Glucose, UA: NEGATIVE
Ketones, UA: NEGATIVE
Leukocytes, UA: NEGATIVE
Nitrite, UA: NEGATIVE
PH UA: 7 (ref 5.0–7.5)
Protein, UA: NEGATIVE
RBC UA: NEGATIVE
Specific Gravity, UA: 1.015 (ref 1.005–1.030)
UUROB: 0.2 mg/dL (ref 0.2–1.0)

## 2018-03-13 LAB — MICROSCOPIC EXAMINATION: WBC UA: NONE SEEN /HPF (ref 0–5)

## 2018-03-13 MED ORDER — CIPROFLOXACIN HCL 500 MG PO TABS
500.0000 mg | ORAL_TABLET | Freq: Once | ORAL | Status: AC
  Administered 2018-03-13: 500 mg via ORAL

## 2018-03-13 MED ORDER — LIDOCAINE HCL URETHRAL/MUCOSAL 2 % EX GEL
1.0000 "application " | Freq: Once | CUTANEOUS | Status: AC
  Administered 2018-03-13: 1 via URETHRAL

## 2018-03-13 NOTE — Progress Notes (Signed)
   03/13/18  CC:  Chief Complaint  Patient presents with  . Cysto    HPI: 72 year old female initially referred for microscopic hematuria who was seen and evaluated by Zara Council.  She underwent CT Urogram on on 03/08/2018 which shows no GU pathology.  There were several incidental findings including a lateral ventral wall abdominal hernia containing fat.  There is also a subtle hypoattenuating lesion of the right hepatic lobe favoring hemangioma but follow-up was recommended.  She presents today for office cystoscopy.  She does note that she has occasional left lower quadrant pain.  On CT urogram, a ventral hernia was seen at this exact location of her pain.  She also asked that we check her bottom today.  She is been having irritation and discomfort with wiping.  She does have issues with hemorrhoids and constipation.   Blood pressure (!) 143/80, pulse 71, height 5\' 3"  (1.6 m), weight 180 lb (81.6 kg). NED. A&Ox3.   No respiratory distress   Abd soft, NT, ND Normal external genitalia with patent urethral meatus There is a small perianal lesion measuring approximately 0.75 cm, linear in shape but about 1 cm from the sphincter, and the 7 o'clock position when examined in the supine, appearance most consistent with granulation tissue.  She also has some small hemorrhoids appreciated.  Cystoscopy Procedure Note  Patient identification was confirmed, informed consent was obtained, and patient was prepped using Betadine solution.  Lidocaine jelly was administered per urethral meatus.    Preoperative abx where received prior to procedure.    Procedure: - Flexible cystoscope introduced, without any difficulty.   - Thorough search of the bladder revealed:    normal urethral meatus    normal urothelium    no stones    no ulcers     no tumors    no urethral polyps    no trabeculation  - Ureteral orifices were normal in position and appearance.  Post-Procedure: - Patient  tolerated the procedure well  Assessment/ Plan:  1. Microscopic hematuria Status post negative work-up including CT urogram Cystoscopy today unremarkable Recommend referral back to urology in 2 to 3 years for repeat evaluation if she has persistent microscopic blood - Urinalysis, Complete - ciprofloxacin (CIPRO) tablet 500 mg - lidocaine (XYLOCAINE) 2 % jelly 1 application  2. Ventral hernia without obstruction or gangrene Symptomatic with occasional left lower quadrant pain at the location of the hernia on CT scan She is already a patient of Dr. Bary Castilla and would like to see him to discuss this further  3. Perianal lesion Symptomatic lesion adjacent to her anus but does not appear to be consistent with hemorrhoid I advised her to have this evaluated by Dr. Bary Castilla as well  4. Liver lesion Incidental liver lesion, most likely hemangioma No history of liver disease I will contact her PCP for further surveillance of this, per radiology recommend consideration of abdominal ultrasound down the road She is seeing her primary in November  F/u with Urology prn  Hollice Espy, MD

## 2018-04-16 ENCOUNTER — Ambulatory Visit: Payer: Medicare HMO | Admitting: General Surgery

## 2018-04-16 ENCOUNTER — Encounter: Payer: Self-pay | Admitting: General Surgery

## 2018-04-16 VITALS — BP 138/78 | HR 82 | Ht 65.0 in | Wt 182.0 lb

## 2018-04-16 DIAGNOSIS — A630 Anogenital (venereal) warts: Secondary | ICD-10-CM | POA: Diagnosis not present

## 2018-04-16 DIAGNOSIS — Z86010 Personal history of colonic polyps: Secondary | ICD-10-CM

## 2018-04-16 DIAGNOSIS — K432 Incisional hernia without obstruction or gangrene: Secondary | ICD-10-CM | POA: Diagnosis not present

## 2018-04-16 DIAGNOSIS — R69 Illness, unspecified: Secondary | ICD-10-CM | POA: Diagnosis not present

## 2018-04-16 NOTE — Progress Notes (Signed)
Patient ID: Meredith Scott, female   DOB: [birth year], 72 y.o.   MRN: [id number]  Chief Complaint  Patient presents with  . Other    HPI Meredith Scott is a 72 y.o. female here today for a evaluation of a ventral hernia. Patient did not the hernia was there until she had a ct scan done last month. Moves her bowels daily.  Patient noticed some soreness at her rectal area. No bleed or pain. Noticed this six months ago.  HPI  Past Medical History:  Diagnosis Date  . Anxiety   . Asthma   . Asymptomatic varicose veins 2011  . Breast screening, unspecified 2013  . Colon polyp   . Diffuse cystic mastopathy 2013  . Hypertension   . Hypothyroidism    Dr Eddie Dibbles  . Obesity, unspecified 2013  . Screening for obesity 2013  . Skin cancer of face 2018   squamous/ left cheek  . Special screening for malignant neoplasms, colon 2013    Past Surgical History:  Procedure Laterality Date  . ABDOMINAL HYSTERECTOMY  1994  . BREAST CYST ASPIRATION Right 1997?   breast aspiration d/t infected milk gland  . BREAST MASS EXCISION Left 2011  . COLONOSCOPY  2005   done by Dr. Jamal Collin  . COLONOSCOPY N/A 01/27/2015   Procedure: COLONOSCOPY;  Surgeon: Christene Lye, MD;  Location: [facility] ENDOSCOPY;  Service: Endoscopy;  Laterality: N/A;  . DILATION AND CURETTAGE OF UTERUS  2007  . FOOT SURGERY Right   . LIPOMA EXCISION  2010   breast bone  . SKIN CANCER EXCISION Left 12/2016   under left eye, [facility]  . TONGUE BIOPSY      Family History  Problem Relation Age of Onset  . Liver cancer Mother   . Thyroid disease Mother   . Breast cancer Other   . Diabetes Father   . Healthy Sister   . Healthy Brother   . Lung cancer Maternal Uncle     Social History Social History   Tobacco Use  . Smoking status: Never Smoker  . Smokeless tobacco: Never Used  Substance Use Topics  . Alcohol use: No  . Drug use: No    Allergies  Allergen Reactions  . Shellfish Allergy Other (See  Comments)    Throat closes  . Sulfa Antibiotics Swelling  . Neosporin [Neomycin-Bacitracin Zn-Polymyx] Rash    Current Outpatient Medications  Medication Sig Dispense Refill  . Ascorbic Acid (VITAMIN C PO) Take by mouth.    Marland Kitchen aspirin 81 MG tablet Take 81 mg by mouth daily.    . calcium-vitamin D (OSCAL 500/200 D-3) 500-200 MG-UNIT tablet Take by mouth.    . COMBIVENT RESPIMAT 20-100 MCG/ACT AERS respimat USE 1 PUFF EVERY 4 HOURS AS NEEDED FOR WHEEZING 4 Inhaler 3  . escitalopram (LEXAPRO) 10 MG tablet Take one-half tablets (5 mg total) by mouth daily. 45 tablet 1  . estrogen-methylTESTOSTERone (EST ESTROGENS-METHYLTEST HS) 0.625-1.25 MG tablet TAKE ONE TABLET BY MOUTH FOUR TO FIVE TIMES A WEEK 30 tablet 4  . Fish Oil OIL by Does not apply route as directed.    Marland Kitchen levothyroxine (SYNTHROID, LEVOTHROID) 25 MCG tablet Take 1 tablet by mouth daily. 2 on sunday    . loratadine (CLARITIN) 10 MG tablet Take 10 mg by mouth daily.    . metroNIDAZOLE (METROCREAM) 0.75 % cream Apply 1 application topically daily.    . Multiple Vitamin (MULTIVITAMIN) capsule Take 1 capsule by mouth daily.    Marland Kitchen  SYMBICORT 160-4.5 MCG/ACT inhaler USE 2 PUFFS TWICE DAILY 10.2 Inhaler 4  . zafirlukast (ACCOLATE) 20 MG tablet Take 1 tablet (20 mg total) by mouth 2 (two) times daily. 180 tablet 1   No current facility-administered medications for this visit.     Review of Systems Review of Systems  Constitutional: Negative.   Respiratory: Negative.   Cardiovascular: Negative.     Blood pressure 138/78, pulse 82, height 5\' 5"  (1.651 m), weight 182 lb (82.6 kg).  Physical Exam Physical Exam  Constitutional: She is oriented to person, place, and time. She appears well-developed and well-nourished.  Eyes: Conjunctivae are normal. No scleral icterus.  Neck: Normal range of motion.  Cardiovascular:  Murmur heard.  Systolic murmur is present with a grade of 2/6. Pulmonary/Chest: Effort normal and breath sounds normal.   Abdominal: Soft. Normal appearance and bowel sounds are normal. A hernia (small umbilical hernia) is present.    Genitourinary:     Genitourinary Comments: Skin tag 2 by 4 mm right interior wall   Lymphadenopathy:    She has no cervical adenopathy.  Neurological: She is alert and oriented to person, place, and time.  Skin: Skin is warm and dry.    Data Reviewed Hepatic flexure polyp resected January 27, 2015 showed a tubular adenoma without high-grade dysplasia.  CBC dated June 01, 2017 reviewed.  Hemoglobin 14.2.  MCV 91.5.  White blood cell count 5800.  Platelet count 244,000.  Assessment    CT scan dated March 08, 2018 obtained as part of the hematuria work-up reviewed: IMPRESSION: 1.  No acute process or explanation for hematuria. 2. Lateral ventral abdominal wall defect with minimal fat contained within. The subjacent intrapelvic fat demonstrates slightly increased density. This could be secondary to the small hernia or less likely represent an incidental omental infarct. 3. No other explanation for left-sided pain. 4. Cholelithiasis. 5. Subtle hypoattenuating right hepatic lobe lesion with delayed wedge-shaped adjacent hyperenhancement. Favor hemangioma. If there is any history of primary malignancy or liver disease, recommend further evaluation with pre and post contrast abdominal MRI. If not, consider ultrasound for further characterization and to allow surveillance. 6.  Aortic Atherosclerosis (ICD10-I70.0). 7.  Tiny hiatal hernia.    Plan  Asymptomatic Pfannenstiel hernias with incarcerated omentum.  Anal wart warranting excision.  Past history colonic polyp warranting follow-up in 2021.  The patient wants to defer any intervention until after the first of the year.  I think this is reasonable.  Will likely just need to excise the anal wart under local anesthesia as an office procedure.  With the 2016 colonoscopy (I thought it was earlier in the decade at  the time of the patient's visit) repeat study is not required at this time.  Return in three months.    HPI, Physical Exam, Assessment and Plan have been scribed under the direction and in the presence of Hervey Ard, MD.  Gaspar Cola, CMA  I have completed the exam and reviewed the above documentation for accuracy and completeness.  I agree with the above.  Haematologist has been used and any errors in dictation or transcription are unintentional.  Hervey Ard, M.D., F.A.C.S.  Meredith Scott 04/17/2018, 8:39 PM

## 2018-04-16 NOTE — Patient Instructions (Signed)
Colonoscopy, Adult A colonoscopy is an exam to look at the entire large intestine. During the exam, a lubricated, bendable tube is inserted into the anus and then passed into the rectum, colon, and other parts of the large intestine. A colonoscopy is often done as a part of normal colorectal screening or in response to certain symptoms, such as anemia, persistent diarrhea, abdominal pain, and blood in the stool. The exam can help screen for and diagnose medical problems, including:  Tumors.  Polyps.  Inflammation.  Areas of bleeding.  Tell a health care provider about:  Any allergies you have.  All medicines you are taking, including vitamins, herbs, eye drops, creams, and over-the-counter medicines.  Any problems you or family members have had with anesthetic medicines.  Any blood disorders you have.  Any surgeries you have had.  Any medical conditions you have.  Any problems you have had passing stool. What are the risks? Generally, this is a safe procedure. However, problems may occur, including:  Bleeding.  A tear in the intestine.  A reaction to medicines given during the exam.  Infection (rare).  What happens before the procedure? Eating and drinking restrictions Follow instructions from your health care provider about eating and drinking, which may include:  A few days before the procedure - follow a low-fiber diet. Avoid nuts, seeds, dried fruit, raw fruits, and vegetables.  1-3 days before the procedure - follow a clear liquid diet. Drink only clear liquids, such as clear broth or bouillon, black coffee or tea, clear juice, clear soft drinks or sports drinks, gelatin dessert, and popsicles. Avoid any liquids that contain red or purple dye.  On the day of the procedure - do not eat or drink anything during the 2 hours before the procedure, or within the time period that your health care provider recommends.  Bowel prep If you were prescribed an oral bowel prep  to clean out your colon:  Take it as told by your health care provider. Starting the day before your procedure, you will need to drink a large amount of medicated liquid. The liquid will cause you to have multiple loose stools until your stool is almost clear or light green.  If your skin or anus gets irritated from diarrhea, you may use these to relieve the irritation: ? Medicated wipes, such as adult wet wipes with aloe and vitamin E. ? A skin soothing-product like petroleum jelly.  If you vomit while drinking the bowel prep, take a break for up to 60 minutes and then begin the bowel prep again. If vomiting continues and you cannot take the bowel prep without vomiting, call your health care provider.  General instructions  Ask your health care provider about changing or stopping your regular medicines. This is especially important if you are taking diabetes medicines or blood thinners.  Plan to have someone take you home from the hospital or clinic. What happens during the procedure?  An IV tube may be inserted into one of your veins.  You will be given medicine to help you relax (sedative).  To reduce your risk of infection: ? Your health care team will wash or sanitize their hands. ? Your anal area will be washed with soap.  You will be asked to lie on your side with your knees bent.  Your health care provider will lubricate a long, thin, flexible tube. The tube will have a camera and a light on the end.  The tube will be inserted into your   anus.  The tube will be gently eased through your rectum and colon.  Air will be delivered into your colon to keep it open. You may feel some pressure or cramping.  The camera will be used to take images during the procedure.  A small tissue sample may be removed from your body to be examined under a microscope (biopsy). If any potential problems are found, the tissue will be sent to a lab for testing.  If small polyps are found, your  health care provider may remove them and have them checked for cancer cells.  The tube that was inserted into your anus will be slowly removed. The procedure may vary among health care providers and hospitals. What happens after the procedure?  Your blood pressure, heart rate, breathing rate, and blood oxygen level will be monitored until the medicines you were given have worn off.  Do not drive for 24 hours after the exam.  You may have a small amount of blood in your stool.  You may pass gas and have mild abdominal cramping or bloating due to the air that was used to inflate your colon during the exam.  It is up to you to get the results of your procedure. Ask your health care provider, or the department performing the procedure, when your results will be ready. This information is not intended to replace advice given to you by your health care provider. Make sure you discuss any questions you have with your health care provider. Document Released: 06/30/2000 Document Revised: 05/03/2016 Document Reviewed: 09/14/2015 Elsevier Interactive Patient Education  2018 Elsevier Inc[employer]  

## 2018-04-17 DIAGNOSIS — A630 Anogenital (venereal) warts: Secondary | ICD-10-CM | POA: Insufficient documentation

## 2018-04-17 DIAGNOSIS — K432 Incisional hernia without obstruction or gangrene: Secondary | ICD-10-CM | POA: Insufficient documentation

## 2018-04-18 ENCOUNTER — Telehealth: Payer: Self-pay

## 2018-04-18 NOTE — Telephone Encounter (Signed)
-----   Message from Robert Bellow, MD sent at 04/17/2018  8:45 PM EDT ----- Please notify the patient that at this time she does not require repeat colonoscopy until 2021.  I would recommend excision of the small anal wart as an office procedure under local anesthesia at her follow-up visit in January.

## 2018-04-18 NOTE — Telephone Encounter (Signed)
Notified patient as instructed, patient pleased. Discussed follow-up appointments, patient agrees She is unsure about having the anal wart excision in the office, but will discuss this when seen in January.

## 2018-04-23 DIAGNOSIS — Z0100 Encounter for examination of eyes and vision without abnormal findings: Secondary | ICD-10-CM | POA: Diagnosis not present

## 2018-05-04 ENCOUNTER — Ambulatory Visit (INDEPENDENT_AMBULATORY_CARE_PROVIDER_SITE_OTHER): Payer: Medicare HMO

## 2018-05-04 DIAGNOSIS — Z23 Encounter for immunization: Secondary | ICD-10-CM | POA: Diagnosis not present

## 2018-05-15 ENCOUNTER — Ambulatory Visit (INDEPENDENT_AMBULATORY_CARE_PROVIDER_SITE_OTHER): Payer: Medicare HMO

## 2018-05-15 ENCOUNTER — Ambulatory Visit: Payer: Medicare HMO

## 2018-05-15 VITALS — BP 140/68 | HR 68 | Temp 98.9°F | Ht 65.0 in | Wt 181.6 lb

## 2018-05-15 DIAGNOSIS — Z0000 Encounter for general adult medical examination without abnormal findings: Secondary | ICD-10-CM

## 2018-05-15 NOTE — Patient Instructions (Addendum)
[name] , Thank you for taking time to come for your Medicare Wellness Visit. I appreciate your ongoing commitment to your health goals. Please review the following plan we discussed and let me know if I can assist you in the future.   Screening recommendations/referrals: Colonoscopy: Up to date Mammogram: Up to date Bone Density: Up to date Recommended yearly ophthalmology/optometry visit for glaucoma screening and checkup Recommended yearly dental visit for hygiene and checkup  Vaccinations: Influenza vaccine: Up to date Pneumococcal vaccine: Up to date Tdap vaccine: Up to date Shingles vaccine: Pt declines today.     Advanced directives: Please bring a copy of your POA (Power of Attorney) and/or Living Will to your next appointment.   Conditions/risks identified: Obesity- continue TOPS program and current diet plan of eating 3 small meals a day with two healthy snack in between.   Next appointment: 06/03/18 @ 9 AM with Fenton Malling.   Preventive Care 60 Years and Older, Female Preventive care refers to lifestyle choices and visits with your health care provider that can promote health and wellness. What does preventive care include?  A yearly physical exam. This is also called an annual well check.  Dental exams once or twice a year.  Routine eye exams. Ask your health care provider how often you should have your eyes checked.  Personal lifestyle choices, including:  Daily care of your teeth and gums.  Regular physical activity.  Eating a healthy diet.  Avoiding tobacco and drug use.  Limiting alcohol use.  Practicing safe sex.  Taking low-dose aspirin every day.  Taking vitamin and mineral supplements as recommended by your health care provider. What happens during an annual well check? The services and screenings done by your health care provider during your annual well check will depend on your age, overall health, lifestyle risk factors, and family  history of disease. Counseling  Your health care provider may ask you questions about your:  Alcohol use.  Tobacco use.  Drug use.  Emotional well-being.  Home and relationship well-being.  Sexual activity.  Eating habits.  History of falls.  Memory and ability to understand (cognition).  Work and work Statistician.  Reproductive health. Screening  You may have the following tests or measurements:  Height, weight, and BMI.  Blood pressure.  Lipid and cholesterol levels. These may be checked every 5 years, or more frequently if you are over 42 years old.  Skin check.  Lung cancer screening. You may have this screening every year starting at age 56 if you have a 30-pack-year history of smoking and currently smoke or have quit within the past 15 years.  Fecal occult blood test (FOBT) of the stool. You may have this test every year starting at age 72.  Flexible sigmoidoscopy or colonoscopy. You may have a sigmoidoscopy every 5 years or a colonoscopy every 10 years starting at age 65.  Hepatitis C blood test.  Hepatitis B blood test.  Sexually transmitted disease (STD) testing.  Diabetes screening. This is done by checking your blood sugar (glucose) after you have not eaten for a while (fasting). You may have this done every 1-3 years.  Bone density scan. This is done to screen for osteoporosis. You may have this done starting at age 72.  Mammogram. This may be done every 1-2 years. Talk to your health care provider about how often you should have regular mammograms. Talk with your health care provider about your test results, treatment options, and if necessary, the  need for more tests. Vaccines  Your health care provider may recommend certain vaccines, such as:  Influenza vaccine. This is recommended every year.  Tetanus, diphtheria, and acellular pertussis (Tdap, Td) vaccine. You may need a Td booster every 10 years.  Zoster vaccine. You may need this after  age 42.  Pneumococcal 13-valent conjugate (PCV13) vaccine. One dose is recommended after age 48.  Pneumococcal polysaccharide (PPSV23) vaccine. One dose is recommended after age 5. Talk to your health care provider about which screenings and vaccines you need and how often you need them. This information is not intended to replace advice given to you by your health care provider. Make sure you discuss any questions you have with your health care provider. Document Released: 07/30/2015 Document Revised: 03/22/2016 Document Reviewed: 05/04/2015 Elsevier Interactive Patient Education  2017 [employer] Prevention in the Home Falls can cause injuries. They can happen to people of all ages. There are many things you can do to make your home safe and to help prevent falls. What can I do on the outside of my home?  Regularly fix the edges of walkways and driveways and fix any cracks.  Remove anything that might make you trip as you walk through a door, such as a raised step or threshold.  Trim any bushes or trees on the path to your home.  Use bright outdoor lighting.  Clear any walking paths of anything that might make someone trip, such as rocks or tools.  Regularly check to see if handrails are loose or broken. Make sure that both sides of any steps have handrails.  Any raised decks and porches should have guardrails on the edges.  Have any leaves, snow, or ice cleared regularly.  Use sand or salt on walking paths during winter.  Clean up any spills in your garage right away. This includes oil or grease spills. What can I do in the bathroom?  Use night lights.  Install grab bars by the toilet and in the tub and shower. Do not use towel bars as grab bars.  Use non-skid mats or decals in the tub or shower.  If you need to sit down in the shower, use a plastic, non-slip stool.  Keep the floor dry. Clean up any water that spills on the floor as soon as it  happens.  Remove soap buildup in the tub or shower regularly.  Attach bath mats securely with double-sided non-slip rug tape.  Do not have throw rugs and other things on the floor that can make you trip. What can I do in the bedroom?  Use night lights.  Make sure that you have a light by your bed that is easy to reach.  Do not use any sheets or blankets that are too big for your bed. They should not hang down onto the floor.  Have a firm chair that has side arms. You can use this for support while you get dressed.  Do not have throw rugs and other things on the floor that can make you trip. What can I do in the kitchen?  Clean up any spills right away.  Avoid walking on wet floors.  Keep items that you use a lot in easy-to-reach places.  If you need to reach something above you, use a strong step stool that has a grab bar.  Keep electrical cords out of the way.  Do not use floor polish or wax that makes floors slippery. If you must use wax,  use non-skid floor wax.  Do not have throw rugs and other things on the floor that can make you trip. What can I do with my stairs?  Do not leave any items on the stairs.  Make sure that there are handrails on both sides of the stairs and use them. Fix handrails that are broken or loose. Make sure that handrails are as long as the stairways.  Check any carpeting to make sure that it is firmly attached to the stairs. Fix any carpet that is loose or worn.  Avoid having throw rugs at the top or bottom of the stairs. If you do have throw rugs, attach them to the floor with carpet tape.  Make sure that you have a light switch at the top of the stairs and the bottom of the stairs. If you do not have them, ask someone to add them for you. What else can I do to help prevent falls?  Wear shoes that:  Do not have high heels.  Have rubber bottoms.  Are comfortable and fit you well.  Are closed at the toe. Do not wear sandals.  If you  use a stepladder:  Make sure that it is fully opened. Do not climb a closed stepladder.  Make sure that both sides of the stepladder are locked into place.  Ask someone to hold it for you, if possible.  Clearly mark and make sure that you can see:  Any grab bars or handrails.  First and last steps.  Where the edge of each step is.  Use tools that help you move around (mobility aids) if they are needed. These include:  Canes.  Walkers.  Scooters.  Crutches.  Turn on the lights when you go into a dark area. Replace any light bulbs as soon as they burn out.  Set up your furniture so you have a clear path. Avoid moving your furniture around.  If any of your floors are uneven, fix them.  If there are any pets around you, be aware of where they are.  Review your medicines with your doctor. Some medicines can make you feel dizzy. This can increase your chance of falling. Ask your doctor what other things that you can do to help prevent falls. This information is not intended to replace advice given to you by your health care provider. Make sure you discuss any questions you have with your health care provider. Document Released: 04/29/2009 Document Revised: 12/09/2015 Document Reviewed: 08/07/2014 Elsevier Interactive Patient Education  2017 [employer].

## 2018-05-15 NOTE — Progress Notes (Signed)
Subjective:   Meredith Scott is a 72 y.o. female who presents for Medicare Annual (Subsequent) preventive examination.  Review of Systems:  N/A  Cardiac Risk Factors include: advanced age (>64men, >59 women);obesity (BMI >30kg/m2);hypertension     Objective:     Vitals: BP 140/68 (BP Location: Right Arm)   Pulse 68   Temp 98.9 F (37.2 C) (Oral)   Ht 5\' 5"  (1.651 m)   Wt 181 lb 9.6 oz (82.4 kg)   BMI 30.22 kg/m   Body mass index is 30.22 kg/m.  Advanced Directives 05/15/2018 09/05/2016 05/28/2015  Does Patient Have a Medical Advance Directive? Yes Yes Yes  Type of Paramedic of [place];Living will Healthcare Power of [place];Living will  Copy of [place] in Chart? No - copy requested - -    Tobacco Social History   Tobacco Use  Smoking Status Never Smoker  Smokeless Tobacco Never Used     Counseling given: Not Answered   Clinical Intake:  Pre-visit preparation completed: Yes  Pain : No/denies pain Pain Score: 0-No pain     Nutritional Status: BMI > 30  Obese Nutritional Risks: None Diabetes: No  How often do you need to have someone help you when you read instructions, pamphlets, or other written materials from your doctor or pharmacy?: 1 - Never  Interpreter Needed?: No  Information entered by :: MMarkoski, LPN  Past Medical History:  Diagnosis Date  . Anxiety   . Asthma   . Asymptomatic varicose veins 2011  . Breast screening, unspecified 2013  . Colon polyp   . Diffuse cystic mastopathy 2013  . Hernia of abdominal wall   . Hypertension   . Hypothyroidism    Dr Eddie Dibbles  . Obesity, unspecified 2013  . Screening for obesity 2013  . Skin cancer of face 2018   squamous/ left cheek  . Special screening for malignant neoplasms, colon 2013   Past Surgical History:  Procedure Laterality Date  . ABDOMINAL HYSTERECTOMY  1994  . BREAST CYST ASPIRATION Right 1997?   breast  aspiration d/t infected milk gland  . BREAST MASS EXCISION Left 2011  . COLONOSCOPY  2005   done by Dr. Jamal Collin  . COLONOSCOPY N/A 01/27/2015   Procedure: COLONOSCOPY;  Surgeon: Christene Lye, MD;  Location: [facility] ENDOSCOPY;  Service: Endoscopy;  Laterality: N/A;  . DILATION AND CURETTAGE OF UTERUS  2007  . FOOT SURGERY Right   . LIPOMA EXCISION  2010   breast bone  . SKIN CANCER EXCISION Left 12/2016   under left eye, [facility]  . TONGUE BIOPSY     Family History  Problem Relation Age of Onset  . Liver cancer Mother   . Thyroid disease Mother   . Breast cancer Other   . Diabetes Father   . Healthy Sister   . Healthy Brother   . Lung cancer Maternal Uncle    Social History   Socioeconomic History  . Marital status: Married    Spouse name: Not on file  . Number of children: 1  . Years of education: H/S  . Highest education level: High school graduate  Occupational History  . Occupation: Retired  Scientific laboratory technician  . Financial resource strain: Not hard at all  . Food insecurity:    Worry: Never true    Inability: Never true  . Transportation needs:    Medical: No    Non-medical: No  Tobacco Use  .  Smoking status: Never Smoker  . Smokeless tobacco: Never Used  Substance and Sexual Activity  . Alcohol use: No  . Drug use: No  . Sexual activity: Not Currently  Lifestyle  . Physical activity:    Days per week: 2 days    Minutes per session: 30 min  . Stress: Not at all  Relationships  . Social connections:    Talks on phone: Patient refused    Gets together: Patient refused    Attends religious service: Patient refused    Active member of club or organization: Patient refused    Attends meetings of clubs or organizations: Patient refused    Relationship status: Patient refused  Other Topics Concern  . Not on file  Social History Narrative  . Not on file    Outpatient Encounter Medications as of 05/15/2018  Medication Sig  . Ascorbic Acid  (VITAMIN C PO) Take by mouth daily.   Marland Kitchen aspirin 81 MG tablet Take 81 mg by mouth daily.  . calcium-vitamin D (OSCAL 500/200 D-3) 500-200 MG-UNIT tablet Take by mouth.  . COMBIVENT RESPIMAT 20-100 MCG/ACT AERS respimat USE 1 PUFF EVERY 4 HOURS AS NEEDED FOR WHEEZING  . escitalopram (LEXAPRO) 10 MG tablet Take one-half tablets (5 mg total) by mouth daily.  Marland Kitchen estrogen-methylTESTOSTERone (EST ESTROGENS-METHYLTEST HS) 0.625-1.25 MG tablet TAKE ONE TABLET BY MOUTH FOUR TO FIVE TIMES A WEEK (Patient taking differently: Take 1 tablet by mouth. TAKE ONE TABLET BY MOUTH EVERY FOUR TO FIVE DAYS)  . Fish Oil OIL by Does not apply route as directed.  Marland Kitchen levothyroxine (SYNTHROID, LEVOTHROID) 25 MCG tablet Take 1 tablet by mouth daily. 2 on sunday  . metroNIDAZOLE (METROCREAM) 0.75 % cream Apply 1 application topically daily.  . SYMBICORT 160-4.5 MCG/ACT inhaler USE 2 PUFFS TWICE DAILY (Patient taking differently: No sig reported)  . zafirlukast (ACCOLATE) 20 MG tablet Take 1 tablet (20 mg total) by mouth 2 (two) times daily. (Patient taking differently: Take 20 mg by mouth 2 (two) times daily. )  . loratadine (CLARITIN) 10 MG tablet Take 10 mg by mouth daily.  . Multiple Vitamin (MULTIVITAMIN) capsule Take 1 capsule by mouth daily.   No facility-administered encounter medications on file as of 05/15/2018.     Activities of Daily Living In your present state of health, do you have any difficulty performing the following activities: 05/15/2018 06/01/2017  Hearing? N N  Comment Wears eye glasses.  -  Vision? N N  Difficulty concentrating or making decisions? N N  Walking or climbing stairs? Y N  Comment Due to SOB. -  Dressing or bathing? N N  Doing errands, shopping? N N  Preparing Food and eating ? N -  Using the Toilet? N -  In the past six months, have you accidently leaked urine? Y -  Comment Occasionally, wears protection daily.  -  Do you have problems with loss of bowel control? N -  Managing  your Medications? N -  Managing your Finances? N -  Housekeeping or managing your Housekeeping? N -  Some recent data might be hidden    Patient Care Team: Mar Daring, PA-C as PCP - General (Family Medicine) Byrnett, Forest Gleason, MD (General Surgery) Warnell Forester, NP as Nurse Practitioner (Surgery) Hester Mates, OD as Referring Physician (Optometry) Pa, [facility] (Dermatology)    Assessment:   This is a routine wellness examination for [place].  Exercise Activities and Dietary recommendations Current Exercise Habits: Home exercise routine, Type of exercise:  walking;treadmill, Time (Minutes): 30, Frequency (Times/Week): 2, Weekly Exercise (Minutes/Week): 60, Intensity: Mild, Exercise limited by: None identified  Goals    . DIET - REDUCE PORTION SIZE     Continue TOPS and current diet plan of eating 3 small meals a day with two healthy snack in between.        Fall Risk Fall Risk  05/15/2018 01/29/2018 06/01/2017 10/03/2016 09/05/2016  Falls in the past year? No No No (No Data) No  Comment - - - no falls since previous visit. -   FALL RISK PREVENTION PERTAINING TO THE HOME:  Any stairs in or around the home WITH handrails? No  Home free of loose throw rugs in walkways, pet beds, electrical cords, etc? Yes  Adequate lighting in your home to reduce risk of falls? Yes   ASSISTIVE DEVICES UTILIZED TO PREVENT FALLS:  Life alert? No  Use of a cane, walker or w/c? No  Grab bars in the bathroom? No  Shower chair or bench in shower? Yes Elevated toilet seat or a handicapped toilet? Yes    TIMED UP AND GO:  Was the test performed? No .     Depression Screen PHQ 2/9 Scores 05/15/2018 06/01/2017 09/05/2016 05/31/2016  PHQ - 2 Score 0 0 2 0  PHQ- 9 Score - 0 3 -     Cognitive Function:      6CIT Screen 05/15/2018  What Year? 0 points  What month? 0 points  What time? 0 points  Count back from 20 0 points  Months in reverse 0 points  Repeat phrase  2 points  Total Score 2    Immunization History  Administered Date(s) Administered  . Influenza, High Dose Seasonal PF 04/17/2014, 04/26/2015, 04/22/2016, 04/19/2017, 05/04/2018  . Pneumococcal Conjugate-13 05/22/2014  . Pneumococcal Polysaccharide-23 05/20/2012  . Td 03/14/2004  . Tdap 04/21/2011  . Zoster 12/02/2012  . Zoster Recombinat (Shingrix) 10/25/2017, 01/30/2018    Qualifies for Shingles Vaccine? Up to date  Tdap: Up to date  Flu Vaccine: Up to date  Pneumococcal Vaccine: Up to date   Screening Tests Health Maintenance  Topic Date Due  . MAMMOGRAM  12/12/2019  . COLONOSCOPY  01/27/2020  . TETANUS/TDAP  04/20/2021  . INFLUENZA VACCINE  Completed  . DEXA SCAN  Completed  . Hepatitis C Screening  Completed  . PNA vac Low Risk Adult  Completed    Cancer Screenings:  Colorectal Screening: Completed 01/27/15. Repeat every 5 years.  Mammogram: Completed 12/12/17.   Bone Density: Completed 06/14/15.  Lung Cancer Screening: (Low Dose CT Chest recommended if Age 89-80 years, 30 pack-year currently smoking OR have quit w/in 15years.) does not qualify.    Additional Screening:  Hepatitis C Screening: Up to date  Vision Screening: Recommended annual ophthalmology exams for early detection of glaucoma and other disorders of the eye.  Dental Screening: Recommended annual dental exams for proper oral hygiene  Community Resource Referral:  CRR required this visit?  No       Plan:  I have personally reviewed and addressed the Medicare Annual Wellness questionnaire and have noted the following in the patient's chart:  A. Medical and social history B. Use of alcohol, tobacco or illicit drugs  C. Current medications and supplements D. Functional ability and status E.  Nutritional status F.  Physical activity G. Advance directives H. List of other physicians I.  Hospitalizations, surgeries, and ER visits in previous 12 months J.  Vitals K. Screenings such  as hearing and  vision if needed, cognitive and depression L. Referrals and appointments - none  In addition, I have reviewed and discussed with patient certain preventive protocols, quality metrics, and best practice recommendations. A written personalized care plan for preventive services as well as general preventive health recommendations were provided to patient.  See attached scanned questionnaire for additional information.   Signed,  Fabio Neighbors, LPN Nurse Health Advisor   Nurse Recommendations: None.

## 2018-05-27 ENCOUNTER — Other Ambulatory Visit: Payer: Self-pay | Admitting: Physician Assistant

## 2018-05-27 DIAGNOSIS — F320 Major depressive disorder, single episode, mild: Secondary | ICD-10-CM

## 2018-06-03 ENCOUNTER — Ambulatory Visit (INDEPENDENT_AMBULATORY_CARE_PROVIDER_SITE_OTHER): Payer: Medicare HMO | Admitting: Physician Assistant

## 2018-06-03 ENCOUNTER — Encounter: Payer: Self-pay | Admitting: Physician Assistant

## 2018-06-03 VITALS — BP 140/80 | HR 60 | Temp 97.6°F | Resp 16 | Ht 65.0 in | Wt 180.4 lb

## 2018-06-03 DIAGNOSIS — Z0000 Encounter for general adult medical examination without abnormal findings: Secondary | ICD-10-CM

## 2018-06-03 DIAGNOSIS — J4520 Mild intermittent asthma, uncomplicated: Secondary | ICD-10-CM

## 2018-06-03 DIAGNOSIS — Z833 Family history of diabetes mellitus: Secondary | ICD-10-CM | POA: Diagnosis not present

## 2018-06-03 DIAGNOSIS — E039 Hypothyroidism, unspecified: Secondary | ICD-10-CM | POA: Diagnosis not present

## 2018-06-03 DIAGNOSIS — E7800 Pure hypercholesterolemia, unspecified: Secondary | ICD-10-CM | POA: Diagnosis not present

## 2018-06-03 MED ORDER — BUDESONIDE-FORMOTEROL FUMARATE 160-4.5 MCG/ACT IN AERO: INHALATION_SPRAY | RESPIRATORY_TRACT | 4 refills | Status: DC

## 2018-06-03 NOTE — Patient Instructions (Signed)
Health Maintenance for Postmenopausal Women Menopause is a normal process in which your reproductive ability comes to an end. This process happens gradually over a span of months to years, usually between the ages of 22 and 9. Menopause is complete when you have missed 12 consecutive menstrual periods. It is important to talk with your health care provider about some of the most common conditions that affect postmenopausal women, such as heart disease, cancer, and bone loss (osteoporosis). Adopting a healthy lifestyle and getting preventive care can help to promote your health and wellness. Those actions can also lower your chances of developing some of these common conditions. What should I know about menopause? During menopause, you may experience a number of symptoms, such as:  Moderate-to-severe hot flashes.  Night sweats.  Decrease in sex drive.  Mood swings.  Headaches.  Tiredness.  Irritability.  Memory problems.  Insomnia.  Choosing to treat or not to treat menopausal changes is an individual decision that you make with your health care provider. What should I know about hormone replacement therapy and supplements? Hormone therapy products are effective for treating symptoms that are associated with menopause, such as hot flashes and night sweats. Hormone replacement carries certain risks, especially as you become older. If you are thinking about using estrogen or estrogen with progestin treatments, discuss the benefits and risks with your health care provider. What should I know about heart disease and stroke? Heart disease, heart attack, and stroke become more likely as you age. This may be due, in part, to the hormonal changes that your body experiences during menopause. These can affect how your body processes dietary fats, triglycerides, and cholesterol. Heart attack and stroke are both medical emergencies. There are many things that you can do to help prevent heart disease  and stroke:  Have your blood pressure checked at least every 1-2 years. High blood pressure causes heart disease and increases the risk of stroke.  If you are 53-22 years old, ask your health care provider if you should take aspirin to prevent a heart attack or a stroke.  Do not use any tobacco products, including cigarettes, chewing tobacco, or electronic cigarettes. If you need help quitting, ask your health care provider.  It is important to eat a healthy diet and maintain a healthy weight. ? Be sure to include plenty of vegetables, fruits, low-fat dairy products, and lean protein. ? Avoid eating foods that are high in solid fats, added sugars, or salt (sodium).  Get regular exercise. This is one of the most important things that you can do for your health. ? Try to exercise for at least 150 minutes each week. The type of exercise that you do should increase your heart rate and make you sweat. This is known as moderate-intensity exercise. ? Try to do strengthening exercises at least twice each week. Do these in addition to the moderate-intensity exercise.  Know your numbers.Ask your health care provider to check your cholesterol and your blood glucose. Continue to have your blood tested as directed by your health care provider.  What should I know about cancer screening? There are several types of cancer. Take the following steps to reduce your risk and to catch any cancer development as early as possible. Breast Cancer  Practice breast self-awareness. ? This means understanding how your breasts normally appear and feel. ? It also means doing regular breast self-exams. Let your health care provider know about any changes, no matter how small.  If you are 40  or older, have a clinician do a breast exam (clinical breast exam or CBE) every year. Depending on your age, family history, and medical history, it may be recommended that you also have a yearly breast X-ray (mammogram).  If you  have a family history of breast cancer, talk with your health care provider about genetic screening.  If you are at high risk for breast cancer, talk with your health care provider about having an MRI and a mammogram every year.  Breast cancer (BRCA) gene test is recommended for women who have family members with BRCA-related cancers. Results of the assessment will determine the need for genetic counseling and BRCA1 and for BRCA2 testing. BRCA-related cancers include these types: ? Breast. This occurs in males or females. ? Ovarian. ? Tubal. This may also be called fallopian tube cancer. ? Cancer of the abdominal or pelvic lining (peritoneal cancer). ? Prostate. ? Pancreatic.  Cervical, Uterine, and Ovarian Cancer Your health care provider may recommend that you be screened regularly for cancer of the pelvic organs. These include your ovaries, uterus, and vagina. This screening involves a pelvic exam, which includes checking for microscopic changes to the surface of your cervix (Pap test).  For women ages 21-65, health care providers may recommend a pelvic exam and a Pap test every three years. For women ages 27-65, they may recommend the Pap test and pelvic exam, combined with testing for human papilloma virus (HPV), every five years. Some types of HPV increase your risk of cervical cancer. Testing for HPV may also be done on women of any age who have unclear Pap test results.  Other health care providers may not recommend any screening for nonpregnant women who are considered low risk for pelvic cancer and have no symptoms. Ask your health care provider if a screening pelvic exam is right for you.  If you have had past treatment for cervical cancer or a condition that could lead to cancer, you need Pap tests and screening for cancer for at least 20 years after your treatment. If Pap tests have been discontinued for you, your risk factors (such as having a new sexual partner) need to be  reassessed to determine if you should start having screenings again. Some women have medical problems that increase the chance of getting cervical cancer. In these cases, your health care provider may recommend that you have screening and Pap tests more often.  If you have a family history of uterine cancer or ovarian cancer, talk with your health care provider about genetic screening.  If you have vaginal bleeding after reaching menopause, tell your health care provider.  There are currently no reliable tests available to screen for ovarian cancer.  Lung Cancer Lung cancer screening is recommended for adults 47-43 years old who are at high risk for lung cancer because of a history of smoking. A yearly low-dose CT scan of the lungs is recommended if you:  Currently smoke.  Have a history of at least 30 pack-years of smoking and you currently smoke or have quit within the past 15 years. A pack-year is smoking an average of one pack of cigarettes per day for one year.  Yearly screening should:  Continue until it has been 15 years since you quit.  Stop if you develop a health problem that would prevent you from having lung cancer treatment.  Colorectal Cancer  This type of cancer can be detected and can often be prevented.  Routine colorectal cancer screening usually begins at  age 42 and continues through age 45.  If you have risk factors for colon cancer, your health care provider may recommend that you be screened at an earlier age.  If you have a family history of colorectal cancer, talk with your health care provider about genetic screening.  Your health care provider may also recommend using home test kits to check for hidden blood in your stool.  A small camera at the end of a tube can be used to examine your colon directly (sigmoidoscopy or colonoscopy). This is done to check for the earliest forms of colorectal cancer.  Direct examination of the colon should be repeated every  5-10 years until age 71. However, if early forms of precancerous polyps or small growths are found or if you have a family history or genetic risk for colorectal cancer, you may need to be screened more often.  Skin Cancer  Check your skin from head to toe regularly.  Monitor any moles. Be sure to tell your health care provider: ? About any new moles or changes in moles, especially if there is a change in a mole's shape or color. ? If you have a mole that is larger than the size of a pencil eraser.  If any of your family members has a history of skin cancer, especially at a young age, talk with your health care provider about genetic screening.  Always use sunscreen. Apply sunscreen liberally and repeatedly throughout the day.  Whenever you are outside, protect yourself by wearing long sleeves, pants, a wide-brimmed hat, and sunglasses.  What should I know about osteoporosis? Osteoporosis is a condition in which bone destruction happens more quickly than new bone creation. After menopause, you may be at an increased risk for osteoporosis. To help prevent osteoporosis or the bone fractures that can happen because of osteoporosis, the following is recommended:  If you are 46-71 years old, get at least 1,000 mg of calcium and at least 600 mg of vitamin D per day.  If you are older than age 55 but younger than age 65, get at least 1,200 mg of calcium and at least 600 mg of vitamin D per day.  If you are older than age 54, get at least 1,200 mg of calcium and at least 800 mg of vitamin D per day.  Smoking and excessive alcohol intake increase the risk of osteoporosis. Eat foods that are rich in calcium and vitamin D, and do weight-bearing exercises several times each week as directed by your health care provider. What should I know about how menopause affects my mental health? Depression may occur at any age, but it is more common as you become older. Common symptoms of depression  include:  Low or sad mood.  Changes in sleep patterns.  Changes in appetite or eating patterns.  Feeling an overall lack of motivation or enjoyment of activities that you previously enjoyed.  Frequent crying spells.  Talk with your health care provider if you think that you are experiencing depression. What should I know about immunizations? It is important that you get and maintain your immunizations. These include:  Tetanus, diphtheria, and pertussis (Tdap) booster vaccine.  Influenza every year before the flu season begins.  Pneumonia vaccine.  Shingles vaccine.  Your health care provider may also recommend other immunizations. This information is not intended to replace advice given to you by your health care provider. Make sure you discuss any questions you have with your health care provider. Document Released: 08/25/2005  Document Revised: 01/21/2016 Document Reviewed: 04/06/2015 Elsevier Interactive Patient Education  2018 [employer]  

## 2018-06-03 NOTE — Progress Notes (Signed)
Patient: Meredith Scott, Female    DOB: 1946/05/24, 72 y.o.   MRN: [id number] Visit Date: 06/03/2018  Today's Provider: Mar Daring, PA-C   Chief Complaint  Patient presents with  . Annual Exam   Subjective:     Complete Physical Meredith Scott is a 72 y.o. female. She feels well. She reports exercising. She reports she is sleeping well. -----------------------------------------------------------   Review of Systems  Constitutional: Negative.   HENT: Negative.   Eyes: Positive for itching.  Respiratory: Positive for cough.   Cardiovascular: Negative.   Gastrointestinal: Negative.   Endocrine: Negative.   Genitourinary: Negative.   Musculoskeletal: Positive for arthralgias.  Skin: Negative.   Allergic/Immunologic: Negative.   Neurological: Negative.   Hematological: Negative.   Psychiatric/Behavioral: Negative.     Social History   Socioeconomic History  . Marital status: Married    Spouse name: Not on file  . Number of children: 1  . Years of education: H/S  . Highest education level: High school graduate  Occupational History  . Occupation: Retired  Scientific laboratory technician  . Financial resource strain: Not hard at all  . Food insecurity:    Worry: Never true    Inability: Never true  . Transportation needs:    Medical: No    Non-medical: No  Tobacco Use  . Smoking status: Never Smoker  . Smokeless tobacco: Never Used  Substance and Sexual Activity  . Alcohol use: No  . Drug use: No  . Sexual activity: Not Currently  Lifestyle  . Physical activity:    Days per week: 2 days    Minutes per session: 30 min  . Stress: Not at all  Relationships  . Social connections:    Talks on phone: Patient refused    Gets together: Patient refused    Attends religious service: Patient refused    Active member of club or organization: Patient refused    Attends meetings of clubs or organizations: Patient refused    Relationship status: Patient refused    . Intimate partner violence:    Fear of current or ex partner: Patient refused    Emotionally abused: Patient refused    Physically abused: Patient refused    Forced sexual activity: Patient refused  Other Topics Concern  . Not on file  Social History Narrative  . Not on file    Past Medical History:  Diagnosis Date  . Anxiety   . Asthma   . Asymptomatic varicose veins 2011  . Breast screening, unspecified 2013  . Colon polyp   . Diffuse cystic mastopathy 2013  . Hernia of abdominal wall   . Hypertension   . Hypothyroidism    Dr Eddie Dibbles  . Obesity, unspecified 2013  . Screening for obesity 2013  . Skin cancer of face 2018   squamous/ left cheek  . Special screening for malignant neoplasms, colon 2013     Patient Active Problem List   Diagnosis Date Noted  . Incisional hernia, without obstruction or gangrene 04/17/2018  . Anal wart 04/17/2018  . Other constipation 12/26/2017  . History of colonic polyps 12/26/2017  . Diverticulosis 12/24/2017  . Elevated LDL cholesterol level 05/28/2015  . Acid reflux 04/17/2015  . Acquired hypothyroidism 04/17/2015  . Arthritis 04/17/2015  . Blood pressure elevated without history of HTN 04/17/2015  . Family history of diabetes mellitus 04/17/2015  . Allergic rhinitis 04/07/2015  . Anxiety 04/07/2015  . Arthropathy of temporomandibular joint 04/07/2015  .  Breath shortness 04/07/2015  . Spasm 04/07/2015  . Climacteric 04/07/2015  . Adenopathy, cervical 04/07/2015  . Asthma 02/02/2015  . Fibrocystic breast disease 11/26/2012    Past Surgical History:  Procedure Laterality Date  . ABDOMINAL HYSTERECTOMY  1994  . BREAST CYST ASPIRATION Right 1997?   breast aspiration d/t infected milk gland  . BREAST MASS EXCISION Left 2011  . COLONOSCOPY  2005   done by Dr. Jamal Collin  . COLONOSCOPY N/A 01/27/2015   Procedure: COLONOSCOPY;  Surgeon: Christene Lye, MD;  Location: [facility] ENDOSCOPY;  Service: Endoscopy;  Laterality: N/A;  .  DILATION AND CURETTAGE OF UTERUS  2007  . FOOT SURGERY Right   . LIPOMA EXCISION  2010   breast bone  . SKIN CANCER EXCISION Left 12/2016   under left eye, [facility]  . TONGUE BIOPSY      Her family history includes Breast cancer in her other; Diabetes in her father; Healthy in her brother and sister; Liver cancer in her mother; Lung cancer in her maternal uncle; Thyroid disease in her mother.      Current Outpatient Medications:  .  Ascorbic Acid (VITAMIN C PO), Take by mouth daily. , Disp: , Rfl:  .  aspirin 81 MG tablet, Take 81 mg by mouth daily., Disp: , Rfl:  .  calcium-vitamin D (OSCAL 500/200 D-3) 500-200 MG-UNIT tablet, Take by mouth., Disp: , Rfl:  .  COMBIVENT RESPIMAT 20-100 MCG/ACT AERS respimat, USE 1 PUFF EVERY 4 HOURS AS NEEDED FOR WHEEZING, Disp: 4 Inhaler, Rfl: 3 .  escitalopram (LEXAPRO) 10 MG tablet, Take one-half tablets (5 mg total) by mouth daily., Disp: 45 tablet, Rfl: 1 .  estrogen-methylTESTOSTERone (EST ESTROGENS-METHYLTEST HS) 0.625-1.25 MG tablet, TAKE ONE TABLET BY MOUTH FOUR TO FIVE TIMES A WEEK (Patient taking differently: Take 1 tablet by mouth. TAKE ONE TABLET BY MOUTH EVERY FOUR TO FIVE DAYS), Disp: 30 tablet, Rfl: 4 .  Fish Oil OIL, by Does not apply route as directed., Disp: , Rfl:  .  levothyroxine (SYNTHROID, LEVOTHROID) 25 MCG tablet, Take 1 tablet by mouth daily. 2 on sunday, Disp: , Rfl:  .  loratadine (CLARITIN) 10 MG tablet, Take 10 mg by mouth daily., Disp: , Rfl:  .  metroNIDAZOLE (METROCREAM) 0.75 % cream, Apply 1 application topically daily., Disp: , Rfl:  .  SYMBICORT 160-4.5 MCG/ACT inhaler, USE 2 PUFFS TWICE DAILY (Patient taking differently: No sig reported), Disp: 10.2 Inhaler, Rfl: 4 .  zafirlukast (ACCOLATE) 20 MG tablet, Take 1 tablet (20 mg total) by mouth 2 (two) times daily. (Patient taking differently: Take 20 mg by mouth 2 (two) times daily. ), Disp: 180 tablet, Rfl: 1 .  Multiple Vitamin (MULTIVITAMIN) capsule, Take 1  capsule by mouth daily., Disp: , Rfl:   Patient Care Team: Mar Daring, PA-C as PCP - General (Family Medicine) Bary Castilla, Forest Gleason, MD (General Surgery) Warnell Forester, NP as Nurse Practitioner (Surgery) Hester Mates, OD as Referring Physician (Optometry) Pa, [facility] (Dermatology)     Objective:   Vitals: BP 140/80 (BP Location: Left Arm, Patient Position: Sitting, Cuff Size: Normal)   Pulse 60   Temp 97.6 F (36.4 C) (Oral)   Resp 16   Ht 5\' 5"  (1.651 m)   Wt 180 lb 6.4 oz (81.8 kg)   SpO2 99%   BMI 30.02 kg/m   Physical Exam  Constitutional: She is oriented to person, place, and time. She appears well-developed and well-nourished. No distress.  HENT:  Head: Normocephalic  and atraumatic.  Right Ear: Hearing, tympanic membrane, external ear and ear canal normal.  Left Ear: Hearing, tympanic membrane, external ear and ear canal normal.  Nose: Nose normal.  Mouth/Throat: Uvula is midline, oropharynx is clear and moist and mucous membranes are normal. No oropharyngeal exudate.  Eyes: Pupils are equal, round, and reactive to light. Conjunctivae and EOM are normal. Right eye exhibits no discharge. Left eye exhibits no discharge. No scleral icterus.  Neck: Normal range of motion. Neck supple. No JVD present. Carotid bruit is not present. No tracheal deviation present. No thyromegaly present.  Cardiovascular: Normal rate, regular rhythm, normal heart sounds and intact distal pulses. Exam reveals no gallop and no friction rub.  No murmur heard. Pulmonary/Chest: Effort normal and breath sounds normal. No respiratory distress. She has no wheezes. She has no rales. She exhibits no tenderness.  Abdominal: Soft. Bowel sounds are normal. She exhibits no distension and no mass. There is no tenderness. There is no rebound and no guarding.  Musculoskeletal: Normal range of motion. She exhibits no edema or tenderness.  Lymphadenopathy:    She has no cervical adenopathy.   Neurological: She is alert and oriented to person, place, and time.  Skin: Skin is warm and dry. No rash noted. She is not diaphoretic.  Psychiatric: She has a normal mood and affect. Her behavior is normal. Judgment and thought content normal.  Vitals reviewed.   Activities of Daily Living In your present state of health, do you have any difficulty performing the following activities: 05/15/2018  Hearing? N  Comment Wears eye glasses.   Vision? N  Difficulty concentrating or making decisions? N  Walking or climbing stairs? Y  Comment Due to SOB.  Dressing or bathing? N  Doing errands, shopping? N  Preparing Food and eating ? N  Using the Toilet? N  In the past six months, have you accidently leaked urine? Y  Comment Occasionally, wears protection daily.   Do you have problems with loss of bowel control? N  Managing your Medications? N  Managing your Finances? N  Housekeeping or managing your Housekeeping? N  Some recent data might be hidden    Fall Risk Assessment Fall Risk  05/15/2018 01/29/2018 06/01/2017 10/03/2016 09/05/2016  Falls in the past year? No No No (No Data) No  Comment - - - no falls since previous visit. -     Depression Screen PHQ 2/9 Scores 05/15/2018 06/01/2017 09/05/2016 05/31/2016  PHQ - 2 Score 0 0 2 0  PHQ- 9 Score - 0 3 -    6CIT Screen 05/15/2018  What Year? 0 points  What month? 0 points  What time? 0 points  Count back from 20 0 points  Months in reverse 0 points  Repeat phrase 2 points  Total Score 2      Assessment & Plan:    Annual Physical Reviewed patient's Family Medical History Reviewed and updated list of patient's medical providers Assessment of cognitive impairment was done Assessed patient's functional ability Established a written schedule for health screening [place] Completed and Reviewed  Exercise Activities and Dietary recommendations Goals    . DIET - REDUCE PORTION SIZE     Continue TOPS  and current diet plan of eating 3 small meals a day with two healthy snack in between.        Immunization History  Administered Date(s) Administered  . Influenza, High Dose Seasonal PF 04/17/2014, 04/26/2015, 04/22/2016, 04/19/2017, 05/04/2018  . Pneumococcal Conjugate-13 05/22/2014  .  Pneumococcal Polysaccharide-23 05/20/2012  . Td 03/14/2004  . Tdap 04/21/2011  . Zoster 12/02/2012  . Zoster Recombinat (Shingrix) 10/25/2017, 01/30/2018    Health Maintenance  Topic Date Due  . MAMMOGRAM  12/12/2019  . COLONOSCOPY  01/27/2020  . TETANUS/TDAP  04/20/2021  . INFLUENZA VACCINE  Completed  . DEXA SCAN  Completed  . Hepatitis C Screening  Completed  . PNA vac Low Risk Adult  Completed     Discussed health benefits of physical activity, and encouraged her to engage in regular exercise appropriate for her age and condition.    1. Annual physical exam Normal physical exam today. Will check labs as below and f/u pending lab results. If labs are stable and WNL she will not need to have these rechecked for one year at her next annual physical exam. She is to call the office in the meantime if she has any acute issue, questions or concerns. - CBC with Differential/Platelet - Comprehensive metabolic panel - Hemoglobin A1c  2. Elevated LDL cholesterol level Diet controlled. Will check labs as below and f/u pending results. - Lipid panel  3. Acquired hypothyroidism Stable. Continue levothyroxine 36mcg. Will check labs as below and f/u pending results. - TSH  4. Family history of diabetes mellitus Family H/O this. Will check labs as below and f/u pending results. - Hemoglobin A1c  5. Mild intermittent asthma without complication Stable. Diagnosis pulled for medication refill. Continue current medical treatment plan. - budesonide-formoterol (SYMBICORT) 160-4.5 MCG/ACT inhaler; USE 2 PUFFS TWICE DAILY  Dispense: 10.2 Inhaler; Refill:  4  ------------------------------------------------------------------------------------------------------------    Mar Daring, PA-C  [facility]

## 2018-06-04 ENCOUNTER — Encounter: Payer: Self-pay | Admitting: Physician Assistant

## 2018-06-04 LAB — CBC WITH DIFFERENTIAL/PLATELET
BASOS: 1 %
Basophils Absolute: 0 10*3/uL (ref 0.0–0.2)
EOS (ABSOLUTE): 0.1 10*3/uL (ref 0.0–0.4)
Eos: 2 %
Hematocrit: 41.4 % (ref 34.0–46.6)
Hemoglobin: 13.9 g/dL (ref 11.1–15.9)
Immature Grans (Abs): 0 10*3/uL (ref 0.0–0.1)
Immature Granulocytes: 0 %
LYMPHS ABS: 1.9 10*3/uL (ref 0.7–3.1)
Lymphs: 31 %
MCH: 31.9 pg (ref 26.6–33.0)
MCHC: 33.6 g/dL (ref 31.5–35.7)
MCV: 95 fL (ref 79–97)
MONOS ABS: 0.6 10*3/uL (ref 0.1–0.9)
Monocytes: 10 %
NEUTROS ABS: 3.4 10*3/uL (ref 1.4–7.0)
Neutrophils: 56 %
Platelets: 228 10*3/uL (ref 150–450)
RBC: 4.36 x10E6/uL (ref 3.77–5.28)
RDW: 12.3 % (ref 12.3–15.4)
WBC: 6 10*3/uL (ref 3.4–10.8)

## 2018-06-04 LAB — LIPID PANEL
CHOLESTEROL TOTAL: 208 mg/dL — AB (ref 100–199)
Chol/HDL Ratio: 3.6 ratio (ref 0.0–4.4)
HDL: 57 mg/dL (ref >39)
LDL CALC: 133 mg/dL — AB (ref 0–99)
Triglycerides: 89 mg/dL (ref 0–149)
VLDL Cholesterol Cal: 18 mg/dL (ref 5–40)

## 2018-06-04 LAB — COMPREHENSIVE METABOLIC PANEL
A/G RATIO: 1.8 (ref 1.2–2.2)
ALK PHOS: 75 IU/L (ref 39–117)
ALT: 14 IU/L (ref 0–32)
AST: 13 IU/L (ref 0–40)
Albumin: 4.2 g/dL (ref 3.5–4.8)
BILIRUBIN TOTAL: 0.5 mg/dL (ref 0.0–1.2)
BUN/Creatinine Ratio: 14 (ref 12–28)
BUN: 12 mg/dL (ref 8–27)
CHLORIDE: 102 mmol/L (ref 96–106)
CO2: 24 mmol/L (ref 20–29)
Calcium: 9.3 mg/dL (ref 8.7–10.3)
Creatinine, Ser: 0.88 mg/dL (ref 0.57–1.00)
GFR calc Af Amer: 76 mL/min/{1.73_m2} (ref >59)
GFR calc non Af Amer: 66 mL/min/{1.73_m2} (ref >59)
Globulin, Total: 2.3 g/dL (ref 1.5–4.5)
Glucose: 93 mg/dL (ref 65–99)
POTASSIUM: 4.5 mmol/L (ref 3.5–5.2)
Sodium: 140 mmol/L (ref 134–144)
TOTAL PROTEIN: 6.5 g/dL (ref 6.0–8.5)

## 2018-06-04 LAB — TSH: TSH: 1.2 u[IU]/mL (ref 0.450–4.500)

## 2018-06-04 LAB — HEMOGLOBIN A1C
Est. average glucose Bld gHb Est-mCnc: 108 mg/dL
HEMOGLOBIN A1C: 5.4 % (ref 4.8–5.6)

## 2018-06-05 ENCOUNTER — Telehealth: Payer: Self-pay

## 2018-06-05 NOTE — Telephone Encounter (Signed)
-----   Message from Mar Daring, PA-C sent at 06/05/2018  8:34 AM EST ----- All labs are within normal limits and stable.  Thanks! -JB

## 2018-06-05 NOTE — Telephone Encounter (Signed)
LMTCB

## 2018-06-05 NOTE — Telephone Encounter (Signed)
Pt advised of lab results.  dbs

## 2018-06-21 ENCOUNTER — Encounter: Payer: Self-pay | Admitting: Physician Assistant

## 2018-07-03 ENCOUNTER — Other Ambulatory Visit: Payer: Self-pay | Admitting: Physician Assistant

## 2018-07-03 DIAGNOSIS — N951 Menopausal and female climacteric states: Secondary | ICD-10-CM

## 2018-07-03 MED ORDER — EST ESTROGENS-METHYLTEST 0.625-1.25 MG PO TABS: ORAL_TABLET | ORAL | 5 refills | Status: DC

## 2018-07-03 NOTE — Telephone Encounter (Signed)
Patient's pharmacy has closed down permanently and needs new rx sent to [facility] on [address] of estrogen-methyltestosterone 0.625-1.25 mg

## 2018-07-23 DIAGNOSIS — E039 Hypothyroidism, unspecified: Secondary | ICD-10-CM | POA: Diagnosis not present

## 2018-07-25 ENCOUNTER — Ambulatory Visit: Payer: Medicare HMO | Admitting: General Surgery

## 2018-07-31 DIAGNOSIS — E039 Hypothyroidism, unspecified: Secondary | ICD-10-CM | POA: Diagnosis not present

## 2018-07-31 DIAGNOSIS — R635 Abnormal weight gain: Secondary | ICD-10-CM | POA: Insufficient documentation

## 2018-08-27 ENCOUNTER — Encounter: Payer: Self-pay | Admitting: General Surgery

## 2018-08-27 ENCOUNTER — Ambulatory Visit: Payer: Self-pay

## 2018-08-27 ENCOUNTER — Other Ambulatory Visit: Payer: Self-pay

## 2018-08-27 ENCOUNTER — Ambulatory Visit: Payer: Medicare HMO | Admitting: General Surgery

## 2018-08-27 VITALS — BP 130/72 | HR 71 | Resp 12 | Ht 65.0 in | Wt 182.4 lb

## 2018-08-27 DIAGNOSIS — R160 Hepatomegaly, not elsewhere classified: Secondary | ICD-10-CM

## 2018-08-27 DIAGNOSIS — D225 Melanocytic nevi of trunk: Secondary | ICD-10-CM | POA: Diagnosis not present

## 2018-08-27 DIAGNOSIS — A630 Anogenital (venereal) warts: Secondary | ICD-10-CM

## 2018-08-27 NOTE — Patient Instructions (Addendum)
Keep anal area clean May use wet wipes to gently cleans May notice some bleeding May shower May apply Vaseline as needed  You are scheduled for a ultrasound of the liver at [facility] on 08/29/18 at 8:30 am. You will arrive by 8:15 am and have nothing to eat or drink after midnight the night prior. You will go to the [place] and report to the first desk on the right.

## 2018-08-27 NOTE — Progress Notes (Signed)
Patient ID: Meredith Scott, female   DOB: [birth year], 73 y.o.   MRN: [id number]  Chief Complaint  Patient presents with  . Follow-up    /u rec Ventral hernia and  Removal anal wart,    HPI Meredith Scott is a 72 y.o. female.  Here for follow up ventral hernia and removal anal wart. She does notice some occasional discomfort in her left lower abdomen. No discomfort in the past week. Occasional constipation, uses benefiber.  HPI  Past Medical History:  Diagnosis Date  . Anxiety   . Asthma   . Asymptomatic varicose veins 2011  . Breast screening, unspecified 2013  . Colon polyp   . Diffuse cystic mastopathy 2013  . Hernia of abdominal wall   . Hypertension   . Hypothyroidism    Dr Eddie Dibbles  . Obesity, unspecified 2013  . Screening for obesity 2013  . Skin cancer of face 2018   squamous/ left cheek  . Special screening for malignant neoplasms, colon 2013    Past Surgical History:  Procedure Laterality Date  . ABDOMINAL HYSTERECTOMY  1994  . BREAST CYST ASPIRATION Right 1997?   breast aspiration d/t infected milk gland  . BREAST MASS EXCISION Left 2011  . COLONOSCOPY  2005   done by Dr. Jamal Collin  . COLONOSCOPY N/A 01/27/2015   Procedure: COLONOSCOPY;  Surgeon: Christene Lye, MD;  Location: [facility] ENDOSCOPY;  Service: Endoscopy;  Laterality: N/A;  . DILATION AND CURETTAGE OF UTERUS  2007  . FOOT SURGERY Right   . LIPOMA EXCISION  2010   breast bone  . SKIN CANCER EXCISION Left 12/2016   under left eye, [facility]  . TONGUE BIOPSY      Family History  Problem Relation Age of Onset  . Liver cancer Mother   . Thyroid disease Mother   . Breast cancer Other   . Diabetes Father   . Healthy Sister   . Healthy Brother   . Lung cancer Maternal Uncle     Social History Social History   Tobacco Use  . Smoking status: Never Smoker  . Smokeless tobacco: Never Used  Substance Use Topics  . Alcohol use: No  . Drug use: No    Allergies  Allergen  Reactions  . Shellfish Allergy Other (See Comments)    Throat closes  . Sulfa Antibiotics Swelling  . Neosporin [Neomycin-Bacitracin Zn-Polymyx] Rash    Current Outpatient Medications  Medication Sig Dispense Refill  . Ascorbic Acid (VITAMIN C PO) Take by mouth daily.     Marland Kitchen aspirin 81 MG tablet Take 81 mg by mouth daily.    . budesonide-formoterol (SYMBICORT) 160-4.5 MCG/ACT inhaler USE 2 PUFFS TWICE DAILY 10.2 Inhaler 4  . calcium-vitamin D (OSCAL 500/200 D-3) 500-200 MG-UNIT tablet Take by mouth.    . COMBIVENT RESPIMAT 20-100 MCG/ACT AERS respimat USE 1 PUFF EVERY 4 HOURS AS NEEDED FOR WHEEZING 4 Inhaler 3  . escitalopram (LEXAPRO) 10 MG tablet Take one-half tablets (5 mg total) by mouth daily. 45 tablet 1  . estrogen-methylTESTOSTERone (EST ESTROGENS-METHYLTEST HS) 0.625-1.25 MG tablet TAKE ONE TABLET BY MOUTH EVERY FOUR TO FIVE DAYS 30 tablet 5  . Fish Oil OIL by Does not apply route daily.     Marland Kitchen levothyroxine (SYNTHROID, LEVOTHROID) 25 MCG tablet Take 1 tablet by mouth daily. 2 on Sunday and  thursday    . loratadine (CLARITIN) 10 MG tablet Take 10 mg by mouth daily as needed.     . metroNIDAZOLE (METROCREAM)  0.75 % cream Apply 1 application topically daily.    . Multiple Vitamin (MULTIVITAMIN) capsule Take 1 capsule by mouth daily.    . Wheat Dextrin (BENEFIBER PO) Take by mouth daily.    . zafirlukast (ACCOLATE) 20 MG tablet Take 1 tablet (20 mg total) by mouth 2 (two) times daily. (Patient taking differently: Take 20 mg by mouth 2 (two) times daily. ) 180 tablet 1   No current facility-administered medications for this visit.     Review of Systems Review of Systems  Constitutional: Negative.   Respiratory: Negative.   Cardiovascular: Negative.   Gastrointestinal: Positive for constipation. Negative for diarrhea.    Blood pressure 130/72, pulse 71, resp. rate 12, height 5\' 5"  (1.651 m), weight 182 lb 6.4 oz (82.7 kg), SpO2 99 %.  Physical Exam Physical  Exam Constitutional:      Appearance: Normal appearance. She is well-developed.  Eyes:     General: No scleral icterus.    Conjunctiva/sclera: Conjunctivae normal.  Neck:     Musculoskeletal: Neck supple.  Cardiovascular:     Rate and Rhythm: Normal rate and regular rhythm.     Heart sounds: Normal heart sounds.  Pulmonary:     Effort: Pulmonary effort is normal.     Breath sounds: Normal breath sounds.  Abdominal:     Palpations: Abdomen is soft.     Tenderness: There is abdominal tenderness in the left lower quadrant.    Genitourinary:   Lymphadenopathy:     Cervical: No cervical adenopathy.  Skin:    General: Skin is warm and dry.  Neurological:     Mental Status: She is alert and oriented to person, place, and time.  Psychiatric:        Behavior: Behavior normal.     Data Reviewed The area of the anal wart was cleansed with Betadine followed by 5 cc of 0.5% Xylocaine with 0.25% Marcaine with 1-200,000's of epinephrine.  The lesion was removed with thermal cautery.  Bacitracin ointment and a dry dressing applied.  Procedure was well-tolerated.  Colonoscopy dated January 27, 2015 showed extensive sigmoid diverticulosis as well as a small 3 mm polyp of the hepatic flexure..  The diverticulosis corresponds with her more recent CT scan.  Pathology showed a tubular adenoma without high-grade dysplasia or malignancy.  A follow-up in 2021 would be appropriate.  The CT scan of March 08, 2018 described a 1.5 cm area of hypoattenuation in the right lobe for which additional imaging was recommended.  The patient has no risk factors for hepatocellular carcinoma.  We will arrange for a liver ultrasound to assess this area.  Assessment    CT evidence of left anterior lateral wall abdominal hernia unrelated to her Pfannenstiel incision.  Questionably symptomatic.  Patient has extensive diverticulosis in her reports of discomfort are more compatible with diverticulosis/diverticulitis  without infection/abscess than the ventral hernia.  Anal wart, removed.     Hypoattenuating area on CT warranting ultrasound follow-up.  Plan    Keep anal area clean May use wet wipes to gently cleans May notice some bleeding    No indication for surgical intervention at this time regarding the ventral hernia.   We will arrange for an ultrasound of the liver to assess the CT findings noted above.  HPI, assessment, plan and physical exam has been scribed under the direction and in the presence of Robert Bellow, MD. Karie Fetch, RN  I have completed the exam and reviewed the above documentation for accuracy  and completeness.  I agree with the above.  Haematologist has been used and any errors in dictation or transcription are unintentional.  Hervey Ard, M.D., F.A.C.S.  Forest Gleason Byrnett 08/28/2018, 11:20 AM

## 2018-08-29 ENCOUNTER — Telehealth: Payer: Self-pay | Admitting: General Surgery

## 2018-08-29 ENCOUNTER — Other Ambulatory Visit: Payer: Self-pay | Admitting: General Surgery

## 2018-08-29 ENCOUNTER — Ambulatory Visit
Admission: RE | Admit: 2018-08-29 | Discharge: 2018-08-29 | Disposition: A | Discharge status: Home / Self Care | Payer: Medicare HMO | Source: Ambulatory Visit | Attending: General Surgery | Admitting: General Surgery

## 2018-08-29 DIAGNOSIS — K769 Liver disease, unspecified: Secondary | ICD-10-CM

## 2018-08-29 DIAGNOSIS — R160 Hepatomegaly, not elsewhere classified: Secondary | ICD-10-CM | POA: Diagnosis not present

## 2018-08-29 DIAGNOSIS — K8020 Calculus of gallbladder without cholecystitis without obstruction: Secondary | ICD-10-CM | POA: Diagnosis not present

## 2018-08-29 NOTE — Telephone Encounter (Signed)
Patient notified of results from peri-anal skin biopsy: benign.  Melanocytes, not melanoma. No wart.  U/S results remain inconclusive.  MRI suggested.   Worried about radiation: reviewed that no radiation with MRI.  Renal function is normal (06/03/18) , so she should tolerate contrast.   No past history of clostraphobia.  Amendable to liver MRI.    Will work to schedule at [facility].

## 2018-09-04 ENCOUNTER — Telehealth: Payer: Self-pay | Admitting: *Deleted

## 2018-09-04 NOTE — Telephone Encounter (Signed)
Patient has been scheduled for an MR Abd WWO contrast at [facility] for 09-12-17 at 8 am (arrive 7:30 am). Patient to check in at the [place], registration desk (1st desk on right).   Prep: NPO 4 hours prior.   Patient states she has another appointment that day but she was given the number to Scheduling to get this rescheduled at her convenience.   The patient was instructed to call the office should she have further questions.

## 2018-09-12 ENCOUNTER — Ambulatory Visit: Payer: Medicare HMO

## 2018-09-16 ENCOUNTER — Other Ambulatory Visit: Payer: Self-pay

## 2018-09-16 ENCOUNTER — Telehealth: Payer: Self-pay | Admitting: *Deleted

## 2018-09-16 ENCOUNTER — Ambulatory Visit
Admission: RE | Admit: 2018-09-16 | Discharge: 2018-09-16 | Disposition: A | Discharge status: Home / Self Care | Payer: Medicare HMO | Source: Ambulatory Visit | Attending: General Surgery | Admitting: General Surgery

## 2018-09-16 DIAGNOSIS — K769 Liver disease, unspecified: Secondary | ICD-10-CM

## 2018-09-16 DIAGNOSIS — D1809 Hemangioma of other sites: Secondary | ICD-10-CM | POA: Diagnosis not present

## 2018-09-16 DIAGNOSIS — K862 Cyst of pancreas: Secondary | ICD-10-CM | POA: Diagnosis not present

## 2018-09-16 LAB — POCT I-STAT CREATININE: CREATININE: 0.9 mg/dL (ref 0.44–1.00)

## 2018-09-16 MED ORDER — GADOBUTROL 1 MMOL/ML IV SOLN
8.0000 mL | Freq: Once | INTRAVENOUS | Status: AC | PRN
  Administered 2018-09-16: 7.5 mL via INTRAVENOUS

## 2018-09-16 NOTE — Telephone Encounter (Signed)
-----   Message from Robert Bellow, MD sent at 09/16/2018 12:16 PM EST ----- Please notify results OK.  Repeat exam in one year.  ----- Message ----- From: Interface, Rad Results In Sent: 09/16/2018   9:37 AM EST To: Robert Bellow, MD

## 2018-09-16 NOTE — Telephone Encounter (Signed)
Notified patient as instructed, patient pleased. Discussed follow-up appointments, patient agrees Placed in recall one year

## 2018-09-25 ENCOUNTER — Other Ambulatory Visit: Payer: Self-pay | Admitting: Physician Assistant

## 2018-09-25 MED ORDER — LORATADINE 10 MG PO TABS: 10.0000 mg | ORAL_TABLET | Freq: Every day | ORAL | 6 refills | Status: DC | PRN

## 2018-09-25 NOTE — Telephone Encounter (Signed)
[facility] faxed refill request for the following medications:  loratadine (CLARITIN) 10 MG tablet    Please advise.

## 2018-12-08 ENCOUNTER — Other Ambulatory Visit: Payer: Self-pay | Admitting: Physician Assistant

## 2018-12-08 DIAGNOSIS — F320 Major depressive disorder, single episode, mild: Secondary | ICD-10-CM

## 2018-12-09 ENCOUNTER — Encounter: Payer: Self-pay | Admitting: Physician Assistant

## 2018-12-10 NOTE — Telephone Encounter (Signed)
Please review

## 2018-12-23 ENCOUNTER — Telehealth: Payer: Self-pay | Admitting: General Surgery

## 2018-12-23 NOTE — Telephone Encounter (Signed)
Patient is scheduled with [facility] for 12/25/18 at 9:10 am. I have left a message for the patient with this information and also to let her know to call back to schedule her follow up appointment with Dr Bary Castilla for after her mammogram.

## 2018-12-23 NOTE — Telephone Encounter (Signed)
Notified patient as instructed, patient pleased. Discussed follow-up appointments, patient agrees  

## 2018-12-23 NOTE — Telephone Encounter (Signed)
Patient has called and stated that [facility] has called to let her know that it is time for her annual Mammogram. She would like a referral and or appointment to be made as soon as possible. She states that her last Mammogram was done on 12/11/17. Please call patient once completed.

## 2018-12-25 DIAGNOSIS — Z1231 Encounter for screening mammogram for malignant neoplasm of breast: Secondary | ICD-10-CM | POA: Diagnosis not present

## 2019-01-01 ENCOUNTER — Encounter: Payer: Self-pay | Admitting: General Surgery

## 2019-01-02 ENCOUNTER — Ambulatory Visit: Payer: Medicare HMO | Admitting: General Surgery

## 2019-01-02 ENCOUNTER — Other Ambulatory Visit: Payer: Self-pay

## 2019-01-02 ENCOUNTER — Encounter: Payer: Self-pay | Admitting: General Surgery

## 2019-01-02 VITALS — BP 126/79 | HR 67 | Temp 97.7°F | Resp 16 | Ht 63.5 in | Wt 183.2 lb

## 2019-01-02 DIAGNOSIS — K862 Cyst of pancreas: Secondary | ICD-10-CM | POA: Insufficient documentation

## 2019-01-02 NOTE — Progress Notes (Signed)
Patient ID: Meredith Scott, female   DOB: January 26, 1946, 73 y.o.   MRN: [id number]  Chief Complaint  Patient presents with  . Follow-up    1 year bil mammogram    HPI Meredith Scott is a 73 y.o. female.  Here today for follow up 1 year bilateral mammogram. No complaints. She would like to discuss MRI liver mass from last year.  HPI  Past Medical History:  Diagnosis Date  . Anxiety   . Asthma   . Asymptomatic varicose veins 2011  . Breast screening, unspecified 2013  . Colon polyp   . Diffuse cystic mastopathy 2013  . Hernia of abdominal wall   . Hypertension   . Hypothyroidism    Dr Eddie Dibbles  . Obesity, unspecified 2013  . Screening for obesity 2013  . Skin cancer of face 2018   squamous/ left cheek  . Special screening for malignant neoplasms, colon 2013    Past Surgical History:  Procedure Laterality Date  . ABDOMINAL HYSTERECTOMY  1994  . BREAST CYST ASPIRATION Right 1997?   breast aspiration d/t infected milk gland  . BREAST MASS EXCISION Left 2011  . COLONOSCOPY  2005   done by Dr. Jamal Collin  . COLONOSCOPY N/A 01/27/2015   Procedure: COLONOSCOPY;  Surgeon: Christene Lye, MD;  Location: [facility] ENDOSCOPY;  Service: Endoscopy;  Laterality: N/A;  . DILATION AND CURETTAGE OF UTERUS  2007  . FOOT SURGERY Right   . LIPOMA EXCISION  2010   breast bone  . SKIN CANCER EXCISION Left 12/2016   under left eye, [facility]  . TONGUE BIOPSY      Family History  Problem Relation Age of Onset  . Liver cancer Mother   . Thyroid disease Mother   . Breast cancer Other   . Diabetes Father   . Healthy Sister   . Healthy Brother   . Lung cancer Maternal Uncle     Social History Social History   Tobacco Use  . Smoking status: Never Smoker  . Smokeless tobacco: Never Used  Substance Use Topics  . Alcohol use: No  . Drug use: No    Allergies  Allergen Reactions  . Shellfish Allergy Other (See Comments)    Throat closes  . Sulfa Antibiotics Swelling  .  Neosporin [Neomycin-Bacitracin Zn-Polymyx] Rash    Current Outpatient Medications  Medication Sig Dispense Refill  . Ascorbic Acid (VITAMIN C PO) Take by mouth daily.     Marland Kitchen aspirin 81 MG tablet Take 81 mg by mouth daily.    . budesonide-formoterol (SYMBICORT) 160-4.5 MCG/ACT inhaler USE 2 PUFFS TWICE DAILY 10.2 Inhaler 4  . calcium-vitamin D (OSCAL 500/200 D-3) 500-200 MG-UNIT tablet Take by mouth.    . COMBIVENT RESPIMAT 20-100 MCG/ACT AERS respimat USE 1 PUFF EVERY 4 HOURS AS NEEDED FOR WHEEZING 4 Inhaler 3  . escitalopram (LEXAPRO) 10 MG tablet TAKE 1/2 TABLET BY MOUTH DAILY 45 tablet 1  . estrogen-methylTESTOSTERone (EST ESTROGENS-METHYLTEST HS) 0.625-1.25 MG tablet TAKE ONE TABLET BY MOUTH EVERY FOUR TO FIVE DAYS 30 tablet 5  . Fish Oil OIL by Does not apply route daily.     Marland Kitchen levothyroxine (SYNTHROID, LEVOTHROID) 25 MCG tablet Take 1 tablet by mouth daily. 2 on Sunday and  thursday    . loratadine (CLARITIN) 10 MG tablet Take 1 tablet (10 mg total) by mouth daily as needed. 30 tablet 6  . Multiple Vitamin (MULTIVITAMIN) capsule Take 1 capsule by mouth daily.    . Wheat Dextrin (  BENEFIBER PO) Take by mouth daily.    . zafirlukast (ACCOLATE) 20 MG tablet Take 1 tablet (20 mg total) by mouth 2 (two) times daily. (Patient taking differently: Take 20 mg by mouth 2 (two) times daily. ) 180 tablet 1   No current facility-administered medications for this visit.     Review of Systems Review of Systems  Constitutional: Negative.   Respiratory: Negative.   Cardiovascular: Negative.     Blood pressure 126/79, pulse 67, temperature 97.7 F (36.5 C), temperature source Temporal, resp. rate 16, height 5' 3.5" (1.613 m), weight 183 lb 3.2 oz (83.1 kg), SpO2 99 %.  Physical Exam Physical Exam Constitutional:      Appearance: She is well-developed.  Eyes:     Conjunctiva/sclera: Conjunctivae normal.  Neck:     Musculoskeletal: Normal range of motion.  Cardiovascular:     Rate and  Rhythm: Normal rate and regular rhythm.     Heart sounds: Normal heart sounds.  Pulmonary:     Effort: Pulmonary effort is normal.     Breath sounds: Normal breath sounds.  Chest:     Breasts:        Right: No inverted nipple, mass, nipple discharge, skin change or tenderness.        Left: No inverted nipple, mass, nipple discharge, skin change or tenderness.  Skin:    General: Skin is warm and dry.  Neurological:     Mental Status: She is alert and oriented to person, place, and time.     Data Reviewed September 16, 2018 MRI IMPRESSION: 1. The area of concern in the right lobe of the liver on recent CT and ultrasound examinations corresponds to a small cavernous hemangioma. 2. 8 x 4 mm small cystic lesion in the body of the pancreas. This is favored to be benign, potentially a tiny pancreatic pseudocyst. Repeat evaluation with pancreatic protocol CT scan or repeat abdominal MRI with and without IV gadolinium with MRCP is recommended in 12 months to ensure the stability or resolution of this finding. This recommendation follows ACR consensus guidelines: Management of Incidental Pancreatic Cysts: A White Paper of the ACR Incidental Findings Committee. [place] 6659;93:570-177. 3. Cholelithiasis without evidence of acute cholecystitis at this time. 4. Additional incidental findings, as above. The above study was independently reviewed as well as the preceding ultrasound.  December 25, 2018 bilateral screening mammogram completed at [facility]: BI-RADS-1.    Assessment Benign breast exam.  Asymptomatic ventral hernia.  Improvement in constipation with the use of daily Benefiber.  4 x 8 mm cystic lesion in the pancreas warranting follow-up MRI in 1 year.  2016 colonoscopy was a 3 mm polyp identified during the procedure, not 1.1 cm as on the pathology report.   Plan We will contact you May 2021 to schedule your Mammogram and follow up appointment with Dr.Cyrena Kuchenbecker.   The  patient will be a candidate for repeat colonoscopy in 2021.  HPI, Physical Exam, Assessment and Plan have been scribed under the direction and in the presence of Robert Bellow, MD. Jonnie Finner, CMA  I have completed the exam and reviewed the above documentation for accuracy and completeness.  I agree with the above.  Haematologist has been used and any errors in dictation or transcription are unintentional.  Hervey Ard, M.D., F.A.C.S.  Forest Gleason Tinesha Siegrist 01/02/2019, 10:25 AM

## 2019-01-02 NOTE — Patient Instructions (Addendum)
We will contact you May 2021 to schedule your Mammogram and MRI, and  follow up appointment with Dr.Byrnett.   Umbilical Hernia, Adult  A hernia is a bulge of tissue that pushes through an opening between muscles. An umbilical hernia happens in the abdomen, near the belly button (umbilicus). The hernia may contain tissues from the small intestine, large intestine, or fatty tissue covering the intestines (omentum). Umbilical hernias in adults tend to get worse over time, and they require surgical treatment. There are several types of umbilical hernias. You may have:  A hernia located just above or below the umbilicus (indirect hernia). This is the most common type of umbilical hernia in adults.  A hernia that forms through an opening formed by the umbilicus (direct hernia).  A hernia that comes and goes (reducible hernia). A reducible hernia may be visible only when you strain, lift something heavy, or cough. This type of hernia can be pushed back into the abdomen (reduced).  A hernia that traps abdominal tissue inside the hernia (incarcerated hernia). This type of hernia cannot be reduced.  A hernia that cuts off blood flow to the tissues inside the hernia (strangulated hernia). The tissues can start to die if this happens. This type of hernia requires emergency treatment. What are the causes? An umbilical hernia happens when tissue inside the abdomen presses on a weak area of the abdominal muscles. What increases the risk? You may have a greater risk of this condition if you:  Are obese.  Have had several pregnancies.  Have a buildup of fluid inside your abdomen (ascites).  Have had surgery that weakens the abdominal muscles. What are the signs or symptoms? The main symptom of this condition is a painless bulge at or near the belly button. A reducible hernia may be visible only when you strain, lift something heavy, or cough. Other symptoms may include:  Dull pain.  A feeling of  pressure. Symptoms of a strangulated hernia may include:  Pain that gets increasingly worse.  Nausea and vomiting.  Pain when pressing on the hernia.  Skin over the hernia becoming red or purple.  Constipation.  Blood in the stool. How is this diagnosed? This condition may be diagnosed based on:  A physical exam. You may be asked to cough or strain while standing. These actions increase the pressure inside your abdomen and force the hernia through the opening in your muscles. Your health care provider may try to reduce the hernia by pressing on it.  Your symptoms and medical history. How is this treated? Surgery is the only treatment for an umbilical hernia. Surgery for a strangulated hernia is done as soon as possible. If you have a small hernia that is not incarcerated, you may need to lose weight before having surgery. Follow these instructions at home:  Lose weight, if told by your health care provider.  Do not try to push the hernia back in.  Watch your hernia for any changes in color or size. Tell your health care provider if any changes occur.  You may need to avoid activities that increase pressure on your hernia.  Do not lift anything that is heavier than 10 lb (4.5 kg) until your health care provider says that this is safe.  Take over-the-counter and prescription medicines only as told by your health care provider.  Keep all follow-up visits as told by your health care provider. This is important. Contact a health care provider if:  Your hernia gets larger.  Your hernia becomes painful. Get help right away if:  You develop sudden, severe pain near the area of your hernia.  You have pain as well as nausea or vomiting.  You have pain and the skin over your hernia changes color.  You develop a fever. This information is not intended to replace advice given to you by your health care provider. Make sure you discuss any questions you have with your health care  provider. Document Released: 12/03/2015 Document Revised: 08/15/2017 Document Reviewed: 01/01/2017 Elsevier Interactive Patient Education  2019 [employer].

## 2019-01-21 DIAGNOSIS — D2272 Melanocytic nevi of left lower limb, including hip: Secondary | ICD-10-CM | POA: Diagnosis not present

## 2019-01-21 DIAGNOSIS — L718 Other rosacea: Secondary | ICD-10-CM | POA: Diagnosis not present

## 2019-01-21 DIAGNOSIS — D2261 Melanocytic nevi of right upper limb, including shoulder: Secondary | ICD-10-CM | POA: Diagnosis not present

## 2019-01-21 DIAGNOSIS — D225 Melanocytic nevi of trunk: Secondary | ICD-10-CM | POA: Diagnosis not present

## 2019-01-21 DIAGNOSIS — D2262 Melanocytic nevi of left upper limb, including shoulder: Secondary | ICD-10-CM | POA: Diagnosis not present

## 2019-01-21 DIAGNOSIS — D2271 Melanocytic nevi of right lower limb, including hip: Secondary | ICD-10-CM | POA: Diagnosis not present

## 2019-01-23 DIAGNOSIS — E039 Hypothyroidism, unspecified: Secondary | ICD-10-CM | POA: Diagnosis not present

## 2019-01-30 DIAGNOSIS — E039 Hypothyroidism, unspecified: Secondary | ICD-10-CM | POA: Diagnosis not present

## 2019-01-30 DIAGNOSIS — R635 Abnormal weight gain: Secondary | ICD-10-CM | POA: Diagnosis not present

## 2019-02-13 ENCOUNTER — Encounter: Payer: Self-pay | Admitting: General Surgery

## 2019-02-13 ENCOUNTER — Ambulatory Visit: Payer: Medicare HMO

## 2019-02-13 ENCOUNTER — Other Ambulatory Visit: Payer: Self-pay | Admitting: Physician Assistant

## 2019-02-13 DIAGNOSIS — N951 Menopausal and female climacteric states: Secondary | ICD-10-CM

## 2019-04-08 ENCOUNTER — Other Ambulatory Visit: Payer: Self-pay

## 2019-04-08 ENCOUNTER — Ambulatory Visit (INDEPENDENT_AMBULATORY_CARE_PROVIDER_SITE_OTHER): Payer: Medicare HMO

## 2019-04-08 DIAGNOSIS — Z23 Encounter for immunization: Secondary | ICD-10-CM | POA: Diagnosis not present

## 2019-04-15 ENCOUNTER — Other Ambulatory Visit: Payer: Self-pay | Admitting: Physician Assistant

## 2019-04-15 DIAGNOSIS — J4520 Mild intermittent asthma, uncomplicated: Secondary | ICD-10-CM

## 2019-04-15 NOTE — Telephone Encounter (Signed)
[facility] faxed refill request for the following medications:  budesonide-formoterol (SYMBICORT) 160-4.5 MCG/ACT inhaler   Please advise.  Thanks, [employer]

## 2019-04-16 MED ORDER — BUDESONIDE-FORMOTEROL FUMARATE 160-4.5 MCG/ACT IN AERO: INHALATION_SPRAY | RESPIRATORY_TRACT | 4 refills | Status: DC

## 2019-04-16 NOTE — Telephone Encounter (Signed)
Meredith Scott, just an FYI-I refilled this, she has OV in November

## 2019-04-17 ENCOUNTER — Encounter: Payer: Self-pay | Admitting: Physician Assistant

## 2019-04-17 ENCOUNTER — Telehealth: Payer: Self-pay | Admitting: Physician Assistant

## 2019-04-17 DIAGNOSIS — J4530 Mild persistent asthma, uncomplicated: Secondary | ICD-10-CM

## 2019-04-17 DIAGNOSIS — J4520 Mild intermittent asthma, uncomplicated: Secondary | ICD-10-CM

## 2019-04-17 MED ORDER — BUDESONIDE-FORMOTEROL FUMARATE 160-4.5 MCG/ACT IN AERO: 2.0000 | INHALATION_SPRAY | Freq: Two times a day (BID) | RESPIRATORY_TRACT | 3 refills | Status: DC

## 2019-04-17 MED ORDER — FLUTICASONE-SALMETEROL 250-50 MCG/DOSE IN AEPB: 1.0000 | INHALATION_SPRAY | Freq: Two times a day (BID) | RESPIRATORY_TRACT | 5 refills | Status: DC

## 2019-04-17 NOTE — Telephone Encounter (Signed)
[facility] faxed refill request for the following medications:  budesonide-formoterol (SYMBICORT) 160-4.5 MCG/ACT inhaler  NOTE:  Drug not covered by pt plan. Preferred alternative is [facility], [facility] [place], [place].  Please call/fax pharmacy to change RX along with strength, directions, quantity and refills.  Please advise.  Thanks, [employer]

## 2019-04-17 NOTE — Telephone Encounter (Signed)
Please advise 

## 2019-04-17 NOTE — Telephone Encounter (Signed)
Sent in advair since symbicort is not covered

## 2019-05-05 DIAGNOSIS — Z135 Encounter for screening for eye and ear disorders: Secondary | ICD-10-CM | POA: Diagnosis not present

## 2019-05-05 DIAGNOSIS — Z0100 Encounter for examination of eyes and vision without abnormal findings: Secondary | ICD-10-CM | POA: Diagnosis not present

## 2019-05-05 DIAGNOSIS — H524 Presbyopia: Secondary | ICD-10-CM | POA: Diagnosis not present

## 2019-05-15 NOTE — Progress Notes (Signed)
Subjective:   Meredith Scott is a 73 y.o. female who presents for Medicare Annual (Subsequent) preventive examination.    This visit is being conducted through telemedicine due to the COVID-19 pandemic. This patient has given me verbal consent via doximity to conduct this visit, patient states they are participating from their home address. Some vital signs may be absent or patient reported.    Patient identification: identified by name, DOB, and current address  Review of Systems:  N/A  Cardiac Risk Factors include: advanced age (>4men, >89 women)     Objective:     Vitals: There were no vitals taken for this visit.  There is no height or weight on file to calculate BMI. Unable to obtain vitals due to visit being conducted via telephonically.   Advanced Directives 05/19/2019 05/15/2018 09/05/2016 05/28/2015  Does Patient Have a Medical Advance Directive? Yes Yes Yes Yes  Type of Paramedic of [place];Living will [place];Living will Healthcare Power of [place];Living will  Copy of [place] in Chart? No - copy requested No - copy requested - -    Tobacco Social History   Tobacco Use  Smoking Status Never Smoker  Smokeless Tobacco Never Used     Counseling given: Not Answered   Clinical Intake:  Pre-visit preparation completed: Yes  Pain : No/denies pain Pain Score: 0-No pain     Nutritional Risks: None Diabetes: No  How often do you need to have someone help you when you read instructions, pamphlets, or other written materials from your doctor or pharmacy?: 1 - Never  Interpreter Needed?: No  Information entered by :: [facility], LPN  Past Medical History:  Diagnosis Date  . Anxiety   . Asthma   . Asymptomatic varicose veins 2011  . Breast screening, unspecified 2013  . Colon polyp   . Diffuse cystic mastopathy 2013  . Hernia of abdominal wall   .  Hypertension   . Hypothyroidism    Dr Eddie Dibbles  . Obesity, unspecified 2013  . Screening for obesity 2013  . Skin cancer of face 2018   squamous/ left cheek  . Special screening for malignant neoplasms, colon 2013   Past Surgical History:  Procedure Laterality Date  . ABDOMINAL HYSTERECTOMY  1994  . BREAST CYST ASPIRATION Right 1997?   breast aspiration d/t infected milk gland  . BREAST MASS EXCISION Left 2011  . COLONOSCOPY  2005   done by Dr. Jamal Collin  . COLONOSCOPY N/A 01/27/2015   Procedure: COLONOSCOPY;  Surgeon: Christene Lye, MD;  Location: [facility] ENDOSCOPY;  Service: Endoscopy;  Laterality: N/A;  . DILATION AND CURETTAGE OF UTERUS  2007  . FOOT SURGERY Right   . LIPOMA EXCISION  2010   breast bone  . SKIN CANCER EXCISION Left 12/2016   under left eye, [facility]  . TONGUE BIOPSY     Family History  Problem Relation Age of Onset  . Liver cancer Mother   . Thyroid disease Mother   . Breast cancer Other   . Diabetes Father   . Healthy Sister   . Dementia Sister   . Healthy Brother   . Lung cancer Maternal Uncle    Social History   Socioeconomic History  . Marital status: Married    Spouse name: Not on file  . Number of children: 1  . Years of education: H/S  . Highest education level: High school graduate  Occupational History  .  Occupation: Retired  Scientific laboratory technician  . Financial resource strain: Not hard at all  . Food insecurity    Worry: Never true    Inability: Never true  . Transportation needs    Medical: No    Non-medical: No  Tobacco Use  . Smoking status: Never Smoker  . Smokeless tobacco: Never Used  Substance and Sexual Activity  . Alcohol use: No  . Drug use: No  . Sexual activity: Not Currently  Lifestyle  . Physical activity    Days per week: 0 days    Minutes per session: 0 min  . Stress: Not at all  Relationships  . Social Herbalist on phone: Patient refused    Gets together: Patient refused    Attends  religious service: Patient refused    Active member of club or organization: Patient refused    Attends meetings of clubs or organizations: Patient refused    Relationship status: Patient refused  Other Topics Concern  . Not on file  Social History Narrative  . Not on file    Outpatient Encounter Medications as of 05/19/2019  Medication Sig  . ascorbic acid (C 500/ROSE HIPS) 500 MG tablet Take by mouth.  Marland Kitchen aspirin 81 MG tablet Take 81 mg by mouth daily.  . beclomethasone (BECONASE-AQ) 42 MCG/SPRAY nasal spray Place 1 spray into both nostrils 2 (two) times daily. As needed (seasonal)  . budesonide-formoterol (SYMBICORT) 160-4.5 MCG/ACT inhaler Inhale 2 puffs into the lungs 2 (two) times daily. (Patient taking differently: Inhale 2 puffs into the lungs 2 (two) times daily. As needed (seasonal))  . calcium-vitamin D (OSCAL 500/200 D-3) 500-200 MG-UNIT tablet Take 1 tablet by mouth daily with breakfast.   . COMBIVENT RESPIMAT 20-100 MCG/ACT AERS respimat USE 1 PUFF EVERY 4 HOURS AS NEEDED FOR WHEEZING  . escitalopram (LEXAPRO) 10 MG tablet TAKE 1/2 TABLET BY MOUTH DAILY  . estrogen-methylTESTOSTERone (EST ESTROGENS-METHYLTEST HS) 0.625-1.25 MG tablet TAKE 1 TABLET BY MOUTH EVERY 4-5 DAY S (Patient taking differently: Take 1 tablet by mouth as directed. TAKE 1 TABLET BY MOUTH EVERY 6 DAYS)  . Fish Oil OIL by Does not apply route daily.   Marland Kitchen levothyroxine (SYNTHROID, LEVOTHROID) 25 MCG tablet Take 25 mcg by mouth daily before breakfast. 2 on Sunday and Thursday  . loratadine (CLARITIN) 10 MG tablet Take 1 tablet (10 mg total) by mouth daily as needed.  . metroNIDAZOLE (METROCREAM) 0.75 % cream APP EXT TO FACE ONCE DAILY UTD  . Wheat Dextrin (BENEFIBER PO) Take by mouth daily. Or as EOD  . zafirlukast (ACCOLATE) 20 MG tablet Take 1 tablet (20 mg total) by mouth 2 (two) times daily. (Patient taking differently: Take 20 mg by mouth 2 (two) times daily. )  . Ascorbic Acid (VITAMIN C PO) Take by mouth  daily.   . Multiple Vitamin (MULTIVITAMIN) capsule Take 1 capsule by mouth daily.   No facility-administered encounter medications on file as of 05/19/2019.     Activities of Daily Living In your present state of health, do you have any difficulty performing the following activities: 05/19/2019  Hearing? N  Vision? N  Difficulty concentrating or making decisions? N  Walking or climbing stairs? N  Dressing or bathing? N  Doing errands, shopping? N  Preparing Food and eating ? N  Using the Toilet? N  In the past six months, have you accidently leaked urine? N  Do you have problems with loss of bowel control? N  Managing your Medications?  N  Managing your Finances? N  Housekeeping or managing your Housekeeping? N  Some recent data might be hidden    Patient Care Team: Mar Daring, PA-C as PCP - General (Family Medicine) Byrnett, Forest Gleason, MD (General Surgery) Warnell Forester, NP as Nurse Practitioner (Surgery) Hester Mates, OD as Referring Physician (Optometry) Pa, [facility] (Dermatology) Olean Ree, MD as Consulting Physician (General Surgery)    Assessment:   This is a routine wellness examination for Ellyott.  Exercise Activities and Dietary recommendations Current Exercise Habits: Home exercise routine, Type of exercise: walking, Time (Minutes): 30, Frequency (Times/Week): 3, Weekly Exercise (Minutes/Week): 90, Intensity: Mild, Exercise limited by: None identified  Goals    . DIET - REDUCE PORTION SIZE     Continue TOPS and current diet plan of eating 3 small meals a day with two healthy snack in between.     . Weight (lb) < 160 lb (72.6 kg)     Continue current diet plan, increasing water and walking 3 days a week for at least 30 minutes to help aid in weight loss.        Fall Risk: Fall Risk  05/19/2019 05/19/2019 01/02/2019 08/27/2018 05/15/2018  Falls in the past year? 0 0 0 0 No  Comment - - - - -  Number falls in past yr: 0 0 - - -   Injury with Fall? 0 0 - - -  Follow up - - - Falls evaluation completed -    FALL RISK PREVENTION PERTAINING TO THE HOME:  Any stairs in or around the home? No  If so, are there any without handrails? N/A  Home free of loose throw rugs in walkways, pet beds, electrical cords, etc? Yes  Adequate lighting in your home to reduce risk of falls? Yes   ASSISTIVE DEVICES UTILIZED TO PREVENT FALLS:  Life alert? No  Use of a cane, walker or w/c? No  Grab bars in the bathroom? No  Shower chair or bench in shower? Yes  Elevated toilet seat or a handicapped toilet? Yes    TIMED UP AND GO:  Was the test performed? No .    Depression Screen PHQ 2/9 Scores 05/19/2019 05/15/2018 06/01/2017 09/05/2016  PHQ - 2 Score 0 0 0 2  PHQ- 9 Score - - 0 3     Cognitive Function:      6CIT Screen 05/19/2019 05/15/2018  What Year? 0 points 0 points  What month? 0 points 0 points  What time? 0 points 0 points  Count back from 20 0 points 0 points  Months in reverse 0 points 0 points  Repeat phrase 0 points 2 points  Total Score 0 2    Immunization History  Administered Date(s) Administered  . Fluad Quad(high Dose 65+) 04/08/2019  . Influenza, High Dose Seasonal PF 04/17/2014, 04/26/2015, 04/22/2016, 04/19/2017, 05/04/2018  . Pneumococcal Conjugate-13 05/22/2014  . Pneumococcal Polysaccharide-23 05/20/2012  . Td 03/14/2004  . Tdap 04/21/2011  . Zoster 12/02/2012  . Zoster Recombinat (Shingrix) 10/25/2017, 01/30/2018    Qualifies for Shingles Vaccine? Completed series  Tdap: Up to date  Flu Vaccine: Up to date  Pneumococcal Vaccine: Completed series  Screening Tests Health Maintenance  Topic Date Due  . COLONOSCOPY  01/27/2020  . DEXA SCAN  06/13/2020  . MAMMOGRAM  12/24/2020  . TETANUS/TDAP  04/20/2021  . INFLUENZA VACCINE  Completed  . Hepatitis C Screening  Completed  . PNA vac Low Risk Adult  Completed    Cancer  Screenings:  Colorectal Screening: Completed 01/27/15.  Repeat every 5 years.  Mammogram: Completed 12/25/18.   Bone Density: Completed 06/14/15. Results reflect OSTEOPENIA. Repeat every 5 years.   Lung Cancer Screening: (Low Dose CT Chest recommended if Age 63-80 years, 30 pack-year currently smoking OR have quit w/in 15years.) does not qualify.   Additional Screening:  Hepatitis C Screening: Up to date  Vision Screening: Recommended annual ophthalmology exams for early detection of glaucoma and other disorders of the eye.  Dental Screening: Recommended annual dental exams for proper oral hygiene  Community Resource Referral:  CRR required this visit?  No       Plan:  I have personally reviewed and addressed the Medicare Annual Wellness questionnaire and have noted the following in the patient's chart:  A. Medical and social history B. Use of alcohol, tobacco or illicit drugs  C. Current medications and supplements D. Functional ability and status E.  Nutritional status F.  Physical activity G. Advance directives H. List of other physicians I.  Hospitalizations, surgeries, and ER visits in previous 12 months J.  [place] such as hearing and vision if needed, cognitive and depression L. Referrals and appointments   In addition, I have reviewed and discussed with patient certain preventive protocols, quality metrics, and best practice recommendations. A written personalized care plan for preventive services as well as general preventive health recommendations were provided to patient. Nurse Health Advisor  Signed,    Teofila Bowery [place], [place]  [id number] Nurse Health Advisor   Nurse Notes: None.

## 2019-05-19 ENCOUNTER — Other Ambulatory Visit: Payer: Self-pay

## 2019-05-19 ENCOUNTER — Ambulatory Visit (INDEPENDENT_AMBULATORY_CARE_PROVIDER_SITE_OTHER): Payer: Medicare HMO

## 2019-05-19 DIAGNOSIS — Z0000 Encounter for general adult medical examination without abnormal findings: Secondary | ICD-10-CM

## 2019-05-19 NOTE — Patient Instructions (Signed)
Meredith Scott , Thank you for taking time to come for your Medicare Wellness Visit. I appreciate your ongoing commitment to your health goals. Please review the following plan we discussed and let me know if I can assist you in the future.   Screening recommendations/referrals: Colonoscopy: Up to date, due 01/2020 Mammogram: Up to date, due 12/2020 Bone Density: Up to date, due 05/2020 Recommended yearly ophthalmology/optometry visit for glaucoma screening and checkup Recommended yearly dental visit for hygiene and checkup  Vaccinations: Influenza vaccine: Up to date Pneumococcal vaccine: Completed series Tdap vaccine: Up to date, due 04/2021 Shingles vaccine: Completed series    Advanced directives: Please bring a copy of your POA (Power of [place]) and/or Living Will to your next appointment.   Conditions/risks identified: Weight loss- continue current diet plan, increasing water and walking 3 days a week for at least 30 minutes to help aid in weight loss.   Next appointment: 06/06/19 @ 9:00 AM with Fenton Malling.    Preventive Care 69 Years and Older, Female Preventive care refers to lifestyle choices and visits with your health care provider that can promote health and wellness. What does preventive care include?  A yearly physical exam. This is also called an annual well check.  Dental exams once or twice a year.  Routine eye exams. Ask your health care provider how often you should have your eyes checked.  Personal lifestyle choices, including:  Daily care of your teeth and gums.  Regular physical activity.  Eating a healthy diet.  Avoiding tobacco and drug use.  Limiting alcohol use.  Practicing safe sex.  Taking low-dose aspirin every day.  Taking vitamin and mineral supplements as recommended by your health care provider. What happens during an annual well check? The services and screenings done by your health care provider during your annual well check  will depend on your age, overall health, lifestyle risk factors, and family history of disease. Counseling  Your health care provider may ask you questions about your:  Alcohol use.  Tobacco use.  Drug use.  Emotional well-being.  Home and relationship well-being.  Sexual activity.  Eating habits.  History of falls.  Memory and ability to understand (cognition).  Work and work Statistician.  Reproductive health. Screening  You may have the following tests or measurements:  Height, weight, and BMI.  Blood pressure.  Lipid and cholesterol levels. These may be checked every 5 years, or more frequently if you are over 15 years old.  Skin check.  Lung cancer screening. You may have this screening every year starting at age 14 if you have a 30-pack-year history of smoking and currently smoke or have quit within the past 15 years.  Fecal occult blood test (FOBT) of the stool. You may have this test every year starting at age 20.  Flexible sigmoidoscopy or colonoscopy. You may have a sigmoidoscopy every 5 years or a colonoscopy every 10 years starting at age 88.  Hepatitis C blood test.  Hepatitis B blood test.  Sexually transmitted disease (STD) testing.  Diabetes screening. This is done by checking your blood sugar (glucose) after you have not eaten for a while (fasting). You may have this done every 1-3 years.  Bone density scan. This is done to screen for osteoporosis. You may have this done starting at age 52.  Mammogram. This may be done every 1-2 years. Talk to your health care provider about how often you should have regular mammograms. Talk with your health care provider  about your test results, treatment options, and if necessary, the need for more tests. Vaccines  Your health care provider may recommend certain vaccines, such as:  Influenza vaccine. This is recommended every year.  Tetanus, diphtheria, and acellular pertussis (Tdap, Td) vaccine. You may  need a Td booster every 10 years.  Zoster vaccine. You may need this after age 1.  Pneumococcal 13-valent conjugate (PCV13) vaccine. One dose is recommended after age 14.  Pneumococcal polysaccharide (PPSV23) vaccine. One dose is recommended after age 13. Talk to your health care provider about which screenings and vaccines you need and how often you need them. This information is not intended to replace advice given to you by your health care provider. Make sure you discuss any questions you have with your health care provider. Document Released: 07/30/2015 Document Revised: 03/22/2016 Document Reviewed: 05/04/2015 Elsevier Interactive Patient Education  2017 [place] Prevention in the Home Falls can cause injuries. They can happen to people of all ages. There are many things you can do to make your home safe and to help prevent falls. What can I do on the outside of my home?  Regularly fix the edges of walkways and driveways and fix any cracks.  Remove anything that might make you trip as you walk through a door, such as a raised step or threshold.  Trim any bushes or trees on the path to your home.  Use bright outdoor lighting.  Clear any walking paths of anything that might make someone trip, such as rocks or tools.  Regularly check to see if handrails are loose or broken. Make sure that both sides of any steps have handrails.  Any raised decks and porches should have guardrails on the edges.  Have any leaves, snow, or ice cleared regularly.  Use sand or salt on walking paths during winter.  Clean up any spills in your garage right away. This includes oil or grease spills. What can I do in the bathroom?  Use night lights.  Install grab bars by the toilet and in the tub and shower. Do not use towel bars as grab bars.  Use non-skid mats or decals in the tub or shower.  If you need to sit down in the shower, use a plastic, non-slip stool.  Keep the floor  dry. Clean up any water that spills on the floor as soon as it happens.  Remove soap buildup in the tub or shower regularly.  Attach bath mats securely with double-sided non-slip rug tape.  Do not have throw rugs and other things on the floor that can make you trip. What can I do in the bedroom?  Use night lights.  Make sure that you have a light by your bed that is easy to reach.  Do not use any sheets or blankets that are too big for your bed. They should not hang down onto the floor.  Have a firm chair that has side arms. You can use this for support while you get dressed.  Do not have throw rugs and other things on the floor that can make you trip. What can I do in the kitchen?  Clean up any spills right away.  Avoid walking on wet floors.  Keep items that you use a lot in easy-to-reach places.  If you need to reach something above you, use a strong step stool that has a grab bar.  Keep electrical cords out of the way.  Do not use floor polish or  wax that makes floors slippery. If you must use wax, use non-skid floor wax.  Do not have throw rugs and other things on the floor that can make you trip. What can I do with my stairs?  Do not leave any items on the stairs.  Make sure that there are handrails on both sides of the stairs and use them. Fix handrails that are broken or loose. Make sure that handrails are as long as the stairways.  Check any carpeting to make sure that it is firmly attached to the stairs. Fix any carpet that is loose or worn.  Avoid having throw rugs at the top or bottom of the stairs. If you do have throw rugs, attach them to the floor with carpet tape.  Make sure that you have a light switch at the top of the stairs and the bottom of the stairs. If you do not have them, ask someone to add them for you. What else can I do to help prevent falls?  Wear shoes that:  Do not have high heels.  Have rubber bottoms.  Are comfortable and fit you  well.  Are closed at the toe. Do not wear sandals.  If you use a stepladder:  Make sure that it is fully opened. Do not climb a closed stepladder.  Make sure that both sides of the stepladder are locked into place.  Ask someone to hold it for you, if possible.  Clearly mark and make sure that you can see:  Any grab bars or handrails.  First and last steps.  Where the edge of each step is.  Use tools that help you move around (mobility aids) if they are needed. These include:  Canes.  Walkers.  Scooters.  Crutches.  Turn on the lights when you go into a dark area. Replace any light bulbs as soon as they burn out.  Set up your furniture so you have a clear path. Avoid moving your furniture around.  If any of your floors are uneven, fix them.  If there are any pets around you, be aware of where they are.  Review your medicines with your doctor. Some medicines can make you feel dizzy. This can increase your chance of falling. Ask your doctor what other things that you can do to help prevent falls. This information is not intended to replace advice given to you by your health care provider. Make sure you discuss any questions you have with your health care provider. Document Released: 04/29/2009 Document Revised: 12/09/2015 Document Reviewed: 08/07/2014 Elsevier Interactive Patient Education  2017 [employer].

## 2019-06-02 ENCOUNTER — Other Ambulatory Visit: Payer: Self-pay | Admitting: Physician Assistant

## 2019-06-02 DIAGNOSIS — F320 Major depressive disorder, single episode, mild: Secondary | ICD-10-CM

## 2019-06-05 NOTE — Progress Notes (Signed)
Patient: Meredith Scott, Female    DOB: 1945-12-31, 73 y.o.   MRN: [id number] Visit Date: 06/06/2019  Today's Provider: Mar Daring, PA-C   Chief Complaint  Patient presents with  . Annual Exam   Subjective:    I,Meredith Scott,RMA am acting as a Education administrator for [employer], PA-C.   Patient had AWV with [facility] 05/19/19.   Complete Physical Meredith Scott is a 73 y.o. female. Meredith Scott feels well. Meredith Scott reports exercising. Meredith Scott reports Meredith Scott is sleeping well.  Meredith Scott reports that Meredith Scott is having some night terrors. Meredith Scott has been keeping a schedule of them since 2018. Has between 2-4 per month since Meredith Scott started tracking. Most have a stressor event prior to the night terrors.  -----------------------------------------------------------   Review of Systems  Constitutional: Negative for appetite change, chills, fatigue and fever.  HENT: Negative.   Eyes: Negative.   Respiratory: Negative for chest tightness and shortness of breath.   Cardiovascular: Negative for chest pain and palpitations.  Gastrointestinal: Negative for abdominal pain, nausea and vomiting.  Endocrine: Negative.   Genitourinary: Negative.   Musculoskeletal: Positive for arthralgias and myalgias.  Skin: Negative.   Allergic/Immunologic: Negative.   Neurological: Negative for dizziness and weakness.  Hematological: Negative.   Psychiatric/Behavioral: Positive for sleep disturbance.    Social History   Socioeconomic History  . Marital status: Married    Spouse name: Not on file  . Number of children: 1  . Years of education: H/S  . Highest education level: High school graduate  Occupational History  . Occupation: Retired  Scientific laboratory technician  . Financial resource strain: Not hard at all  . Food insecurity    Worry: Never true    Inability: Never true  . Transportation needs    Medical: No    Non-medical: No  Tobacco Use  . Smoking status: Never Smoker  . Smokeless tobacco: Never Used   Substance and Sexual Activity  . Alcohol use: No  . Drug use: No  . Sexual activity: Not Currently  Lifestyle  . Physical activity    Days per week: 0 days    Minutes per session: 0 min  . Stress: Not at all  Relationships  . Social Herbalist on phone: Patient refused    Gets together: Patient refused    Attends religious service: Patient refused    Active member of club or organization: Patient refused    Attends meetings of clubs or organizations: Patient refused    Relationship status: Patient refused  . Intimate partner violence    Fear of current or ex partner: Patient refused    Emotionally abused: Patient refused    Physically abused: Patient refused    Forced sexual activity: Patient refused  Other Topics Concern  . Not on file  Social History Narrative  . Not on file    Past Medical History:  Diagnosis Date  . Anxiety   . Asthma   . Asymptomatic varicose veins 2011  . Breast screening, unspecified 2013  . Colon polyp   . Diffuse cystic mastopathy 2013  . Hernia of abdominal wall   . Hypertension   . Hypothyroidism    Dr Eddie Dibbles  . Obesity, unspecified 2013  . Screening for obesity 2013  . Skin cancer of face 2018   squamous/ left cheek  . Special screening for malignant neoplasms, colon 2013     Patient Active Problem List   Diagnosis Date  Noted  . Knee pain 06/06/2019  . Osteoarthritis of knee 06/06/2019  . Sprain of knee 06/06/2019  . Pancreatic cyst 01/02/2019  . Abnormal weight gain 07/31/2018  . Incisional hernia, without obstruction or gangrene 04/17/2018  . Other constipation 12/26/2017  . History of colonic polyps 12/26/2017  . Diverticulosis 12/24/2017  . Elevated LDL cholesterol level 05/28/2015  . Acid reflux 04/17/2015  . Acquired hypothyroidism 04/17/2015  . Arthritis 04/17/2015  . Blood pressure elevated without history of HTN 04/17/2015  . Family history of diabetes mellitus 04/17/2015  . Allergic rhinitis 04/07/2015  .  Anxiety 04/07/2015  . Arthropathy of temporomandibular joint 04/07/2015  . Breath shortness 04/07/2015  . Spasm 04/07/2015  . Climacteric 04/07/2015  . Adenopathy, cervical 04/07/2015  . Asthma 02/02/2015  . Fibrocystic breast disease 11/26/2012    Past Surgical History:  Procedure Laterality Date  . ABDOMINAL HYSTERECTOMY  1994  . BREAST CYST ASPIRATION Right 1997?   breast aspiration d/t infected milk gland  . BREAST MASS EXCISION Left 2011  . COLONOSCOPY  2005   done by Dr. Jamal Collin  . COLONOSCOPY N/A 01/27/2015   Procedure: COLONOSCOPY;  Surgeon: Christene Lye, MD;  Location: [facility] ENDOSCOPY;  Service: Endoscopy;  Laterality: N/A;  . DILATION AND CURETTAGE OF UTERUS  2007  . FOOT SURGERY Right   . LIPOMA EXCISION  2010   breast bone  . SKIN CANCER EXCISION Left 12/2016   under left eye, [facility]  . TONGUE BIOPSY      Her family history includes Breast cancer in an other family member; Dementia in her sister; Diabetes in her father; Healthy in her brother and sister; Liver cancer in her mother; Lung cancer in her maternal uncle; Thyroid disease in her mother.   Current Outpatient Medications:  .  ascorbic acid (C 500/ROSE HIPS) 500 MG tablet, Take by mouth., Disp: , Rfl:  .  Ascorbic Acid (VITAMIN C PO), Take by mouth daily. , Disp: , Rfl:  .  aspirin 81 MG tablet, Take 81 mg by mouth daily., Disp: , Rfl:  .  beclomethasone (BECONASE-AQ) 42 MCG/SPRAY nasal spray, Place 1 spray into both nostrils 2 (two) times daily. As needed (seasonal), Disp: , Rfl:  .  budesonide-formoterol (SYMBICORT) 160-4.5 MCG/ACT inhaler, Inhale 2 puffs into the lungs 2 (two) times daily. (Patient taking differently: Inhale 2 puffs into the lungs 2 (two) times daily. As needed (seasonal)), Disp: 1 Inhaler, Rfl: 3 .  calcium-vitamin D (OSCAL 500/200 D-3) 500-200 MG-UNIT tablet, Take 1 tablet by mouth daily with breakfast. , Disp: , Rfl:  .  COMBIVENT RESPIMAT 20-100 MCG/ACT AERS  respimat, USE 1 PUFF EVERY 4 HOURS AS NEEDED FOR WHEEZING, Disp: 4 Inhaler, Rfl: 3 .  escitalopram (LEXAPRO) 10 MG tablet, TAKE 1/2 TABLET BY MOUTH DAILY, Disp: 45 tablet, Rfl: 1 .  estrogen-methylTESTOSTERone (EST ESTROGENS-METHYLTEST HS) 0.625-1.25 MG tablet, TAKE 1 TABLET BY MOUTH EVERY 4-5 DAY S (Patient taking differently: Take 1 tablet by mouth as directed. TAKE 1 TABLET BY MOUTH EVERY 6 DAYS), Disp: 30 tablet, Rfl: 5 .  Fish Oil OIL, by Does not apply route daily. , Disp: , Rfl:  .  levothyroxine (SYNTHROID, LEVOTHROID) 25 MCG tablet, Take 25 mcg by mouth daily before breakfast. 2 on Sunday and Thursday, Disp: , Rfl:  .  loratadine (CLARITIN) 10 MG tablet, Take 1 tablet (10 mg total) by mouth daily as needed., Disp: 30 tablet, Rfl: 6 .  metroNIDAZOLE (METROCREAM) 0.75 % cream, APP EXT TO FACE  ONCE DAILY UTD, Disp: , Rfl:  .  Multiple Vitamin (MULTIVITAMIN) capsule, Take 1 capsule by mouth daily., Disp: , Rfl:  .  Wheat Dextrin (BENEFIBER PO), Take by mouth daily. Or as EOD, Disp: , Rfl:  .  zafirlukast (ACCOLATE) 20 MG tablet, Take 1 tablet (20 mg total) by mouth 2 (two) times daily. (Patient not taking: Reported on 06/06/2019), Disp: 180 tablet, Rfl: 1  Patient Care Team: Mar Daring, PA-C as PCP - General (Family Medicine) Bary Castilla, Forest Gleason, MD (General Surgery) Warnell Forester, NP as Nurse Practitioner (Surgery) Hester Mates, OD as Referring Physician (Optometry) [facility], [facility] (Dermatology) Olean Ree, MD as Consulting Physician (General Surgery)     Objective:    Vitals: BP 134/79 (BP Location: Left Arm, Patient Position: Sitting, Cuff Size: Large)   Pulse 65   Temp (!) 97.1 F (36.2 C) (Temporal)   Resp 16   Ht 5\' 3"  (1.6 m)   Wt 183 lb 3.2 oz (83.1 kg)   BMI 32.45 kg/m   Physical Exam Vitals signs reviewed.  Constitutional:      General: Meredith Scott is not in acute distress.    Appearance: Normal appearance. Meredith Scott is well-developed. Meredith Scott is obese. Meredith Scott  is not ill-appearing or diaphoretic.  HENT:     Head: Normocephalic and atraumatic.     Right Ear: Tympanic membrane, ear canal and external ear normal.     Left Ear: Tympanic membrane, ear canal and external ear normal.     Nose: Nose normal.     Mouth/Throat:     Mouth: Mucous membranes are moist.     Pharynx: No oropharyngeal exudate.  Eyes:     General: No scleral icterus.       Right eye: No discharge.        Left eye: No discharge.     Extraocular Movements: Extraocular movements intact.     Conjunctiva/sclera: Conjunctivae normal.     Pupils: Pupils are equal, round, and reactive to light.  Neck:     Musculoskeletal: Normal range of motion and neck supple.     Thyroid: No thyromegaly.     Vascular: No carotid bruit or JVD.     Trachea: No tracheal deviation.  Cardiovascular:     Rate and Rhythm: Normal rate and regular rhythm.     Pulses: Normal pulses.     Heart sounds: Normal heart sounds. No murmur. No friction rub. No gallop.   Pulmonary:     Effort: Pulmonary effort is normal. No respiratory distress.     Breath sounds: Normal breath sounds. No wheezing or rales.  Chest:     Chest wall: No tenderness.     Breasts:        Right: Normal. No mass.        Left: Normal. No mass.  Abdominal:     General: Abdomen is flat. Bowel sounds are normal. There is no distension.     Palpations: Abdomen is soft. There is no mass.     Tenderness: There is no abdominal tenderness. There is no guarding or rebound.  Musculoskeletal: Normal range of motion.        General: No tenderness.     Right lower leg: No edema.     Left lower leg: No edema.  Lymphadenopathy:     Cervical: No cervical adenopathy.     Upper Body:     Right upper body: No supraclavicular, axillary or pectoral adenopathy.     Left upper body: No  supraclavicular, axillary or pectoral adenopathy.  Skin:    General: Skin is warm and dry.     Capillary Refill: Capillary refill takes less than 2 seconds.      Findings: No rash.  Neurological:     General: No focal deficit present.     Mental Status: Meredith Scott is alert and oriented to person, place, and time. Mental status is at baseline.  Psychiatric:        Mood and Affect: Mood normal.        Behavior: Behavior normal.        Thought Content: Thought content normal.        Judgment: Judgment normal.     Activities of Daily Living In your present state of health, do you have any difficulty performing the following activities: 05/19/2019  Hearing? N  Vision? N  Difficulty concentrating or making decisions? N  Walking or climbing stairs? N  Dressing or bathing? N  Doing errands, shopping? N  Preparing Food and eating ? N  Using the Toilet? N  In the past six months, have you accidently leaked urine? N  Do you have problems with loss of bowel control? N  Managing your Medications? N  Managing your Finances? N  Housekeeping or managing your Housekeeping? N  Some recent data might be hidden    Fall Risk Assessment Fall Risk  05/19/2019 05/19/2019 01/02/2019 08/27/2018 05/15/2018  Falls in the past year? 0 0 0 0 No  Comment - - - - -  Number falls in past yr: 0 0 - - -  Injury with Fall? 0 0 - - -  Follow up - - - Falls evaluation completed -     Depression Screen PHQ 2/9 Scores 06/06/2019 05/19/2019 05/15/2018 06/01/2017  PHQ - 2 Score 0 0 0 0  PHQ- 9 Score 0 - - 0    6CIT Screen 05/19/2019  What Year? 0 points  What month? 0 points  What time? 0 points  Count back from 20 0 points  Months in reverse 0 points  Repeat phrase 0 points  Total Score 0       Assessment & Plan:    Annual Physical Reviewed patient's Family Medical History Reviewed and updated list of patient's medical providers Assessment of cognitive impairment was done Assessed patient's functional ability Established a written schedule for health screening [place] Completed and Reviewed  Exercise Activities and Dietary recommendations  Goals    . DIET - REDUCE PORTION SIZE     Continue TOPS and current diet plan of eating 3 small meals a day with two healthy snack in between.     . Weight (lb) < 160 lb (72.6 kg)     Continue current diet plan, increasing water and walking 3 days a week for at least 30 minutes to help aid in weight loss.        Immunization History  Administered Date(s) Administered  . Fluad Quad(high Dose 65+) 04/08/2019  . Influenza, High Dose Seasonal PF 04/17/2014, 04/26/2015, 04/22/2016, 04/19/2017, 05/04/2018  . Pneumococcal Conjugate-13 05/22/2014  . Pneumococcal Polysaccharide-23 05/20/2012  . Td 03/14/2004  . Tdap 04/21/2011  . Zoster 12/02/2012  . Zoster Recombinat (Shingrix) 10/25/2017, 01/30/2018    Health Maintenance  Topic Date Due  . COLONOSCOPY  01/27/2020  . DEXA SCAN  06/13/2020  . MAMMOGRAM  12/24/2020  . TETANUS/TDAP  04/20/2021  . INFLUENZA VACCINE  Completed  . Hepatitis C Screening  Completed  . PNA vac  Low Risk Adult  Completed     Discussed health benefits of physical activity, and encouraged her to engage in regular exercise appropriate for her age and condition.    1. Encounter for annual physical exam Normal physical exam today. Will check labs as below and f/u pending lab results. If labs are stable and WNL Meredith Scott will not need to have these rechecked for one year at her next annual physical exam. Meredith Scott is to call the office in the meantime if Meredith Scott has any acute issue, questions or concerns. - CBC w/Diff - Comprehensive Metabolic Panel (CMET) - HgB A1c  2. History of hematuria Will check labs as below and f/u pending results. - CBC w/Diff - Comprehensive Metabolic Panel (CMET) - POCT urinalysis dipstick  3. Acquired hypothyroidism Stable. Continue Levothyroxine 52mcg. Will check labs as below and f/u pending results. - CBC w/Diff - Comprehensive Metabolic Panel (CMET) - TSH  4. Elevated LDL cholesterol level Diet controlled. Will check labs as below and  f/u pending results. - CBC w/Diff - Comprehensive Metabolic Panel (CMET) - Lipid Profile  5. Adult night terror Suspect stress triggers. Advised that Meredith Scott can increase her escitalopram back to 10mg  to see if that helps. Meredith Scott has been on 5 mg for a while. Also discussed neuro referral if they continue. Meredith Scott wants to think on referral and will let us know if Meredith Scott prefers that.   ------------------------------------------------------------------------------------------------------------    Mar Daring, PA-C  [place]

## 2019-06-06 ENCOUNTER — Encounter: Payer: Self-pay | Admitting: Physician Assistant

## 2019-06-06 ENCOUNTER — Other Ambulatory Visit: Payer: Self-pay

## 2019-06-06 ENCOUNTER — Ambulatory Visit (INDEPENDENT_AMBULATORY_CARE_PROVIDER_SITE_OTHER): Payer: Medicare HMO | Admitting: Physician Assistant

## 2019-06-06 VITALS — BP 134/79 | HR 65 | Temp 97.1°F | Resp 16 | Ht 63.0 in | Wt 183.2 lb

## 2019-06-06 DIAGNOSIS — Z87448 Personal history of other diseases of urinary system: Secondary | ICD-10-CM | POA: Diagnosis not present

## 2019-06-06 DIAGNOSIS — E039 Hypothyroidism, unspecified: Secondary | ICD-10-CM | POA: Diagnosis not present

## 2019-06-06 DIAGNOSIS — M179 Osteoarthritis of knee, unspecified: Secondary | ICD-10-CM | POA: Insufficient documentation

## 2019-06-06 DIAGNOSIS — M25569 Pain in unspecified knee: Secondary | ICD-10-CM | POA: Insufficient documentation

## 2019-06-06 DIAGNOSIS — Z0000 Encounter for general adult medical examination without abnormal findings: Secondary | ICD-10-CM | POA: Diagnosis not present

## 2019-06-06 DIAGNOSIS — F514 Sleep terrors [night terrors]: Secondary | ICD-10-CM | POA: Diagnosis not present

## 2019-06-06 DIAGNOSIS — S8390XA Sprain of unspecified site of unspecified knee, initial encounter: Secondary | ICD-10-CM | POA: Insufficient documentation

## 2019-06-06 DIAGNOSIS — E7800 Pure hypercholesterolemia, unspecified: Secondary | ICD-10-CM | POA: Diagnosis not present

## 2019-06-06 DIAGNOSIS — R69 Illness, unspecified: Secondary | ICD-10-CM | POA: Diagnosis not present

## 2019-06-06 DIAGNOSIS — M1710 Unilateral primary osteoarthritis, unspecified knee: Secondary | ICD-10-CM | POA: Insufficient documentation

## 2019-06-06 NOTE — Patient Instructions (Addendum)
Due for Mammogram in May/June 2021 Due for repeat MRI w/wo contrast and MRCP in March 2021 Due for Colonoscopy May/June 2021  Health Maintenance After Age 73 After age 36, you are at a higher risk for certain long-term diseases and infections as well as injuries from falls. Falls are a major cause of broken bones and head injuries in people who are older than age 43. Getting regular preventive care can help to keep you healthy and well. Preventive care includes getting regular testing and making lifestyle changes as recommended by your health care provider. Talk with your health care provider about:  Which screenings and tests you should have. A screening is a test that checks for a disease when you have no symptoms.  A diet and exercise plan that is right for you. What should I know about screenings and tests to prevent falls? Screening and testing are the best ways to find a health problem early. Early diagnosis and treatment give you the best chance of managing medical conditions that are common after age 46. Certain conditions and lifestyle choices may make you more likely to have a fall. Your health care provider may recommend:  Regular vision checks. Poor vision and conditions such as cataracts can make you more likely to have a fall. If you wear glasses, make sure to get your prescription updated if your vision changes.  Medicine review. Work with your health care provider to regularly review all of the medicines you are taking, including over-the-counter medicines. Ask your health care provider about any side effects that may make you more likely to have a fall. Tell your health care provider if any medicines that you take make you feel dizzy or sleepy.  Osteoporosis screening. Osteoporosis is a condition that causes the bones to get weaker. This can make the bones weak and cause them to break more easily.  Blood pressure screening. Blood pressure changes and medicines to control blood  pressure can make you feel dizzy.  Strength and balance checks. Your health care provider may recommend certain tests to check your strength and balance while standing, walking, or changing positions.  Foot health exam. Foot pain and numbness, as well as not wearing proper footwear, can make you more likely to have a fall.  Depression screening. You may be more likely to have a fall if you have a fear of falling, feel emotionally low, or feel unable to do activities that you used to do.  Alcohol use screening. Using too much alcohol can affect your balance and may make you more likely to have a fall. What actions can I take to lower my risk of falls? General instructions  Talk with your health care provider about your risks for falling. Tell your health care provider if: ? You fall. Be sure to tell your health care provider about all falls, even ones that seem minor. ? You feel dizzy, sleepy, or off-balance.  Take over-the-counter and prescription medicines only as told by your health care provider. These include any supplements.  Eat a healthy diet and maintain a healthy weight. A healthy diet includes low-fat dairy products, low-fat (lean) meats, and fiber from whole grains, beans, and lots of fruits and vegetables. Home safety  Remove any tripping hazards, such as rugs, cords, and clutter.  Install safety equipment such as grab bars in bathrooms and safety rails on stairs.  Keep rooms and walkways well-lit. Activity   Follow a regular exercise program to stay fit. This will help you  maintain your balance. Ask your health care provider what types of exercise are appropriate for you.  If you need a cane or walker, use it as recommended by your health care provider.  Wear supportive shoes that have nonskid soles. Lifestyle  Do not drink alcohol if your health care provider tells you not to drink.  If you drink alcohol, limit how much you have: ? 0-1 drink a day for women. ?  0-2 drinks a day for men.  Be aware of how much alcohol is in your drink. In the [place] one drink equals one typical bottle of beer (12 oz), one-half glass of wine (5 oz), or one shot of hard liquor (1 oz).  Do not use any products that contain nicotine or tobacco, such as cigarettes and e-cigarettes. If you need help quitting, ask your health care provider. Summary  Having a healthy lifestyle and getting preventive care can help to protect your health and wellness after age 24.  Screening and testing are the best way to find a health problem early and help you avoid having a fall. Early diagnosis and treatment give you the best chance for managing medical conditions that are more common for people who are older than age 6.  Falls are a major cause of broken bones and head injuries in people who are older than age 61. Take precautions to prevent a fall at home.  Work with your health care provider to learn what changes you can make to improve your health and wellness and to prevent falls. This information is not intended to replace advice given to you by your health care provider. Make sure you discuss any questions you have with your health care provider. Document Released: 05/16/2017 Document Revised: 10/24/2018 Document Reviewed: 05/16/2017 Elsevier Patient Education  2020 [employer].

## 2019-06-07 LAB — COMPREHENSIVE METABOLIC PANEL
ALT: 14 IU/L (ref 0–32)
AST: 18 IU/L (ref 0–40)
Albumin/Globulin Ratio: 1.7 (ref 1.2–2.2)
Albumin: 4 g/dL (ref 3.7–4.7)
Alkaline Phosphatase: 74 IU/L (ref 39–117)
BUN/Creatinine Ratio: 13 (ref 12–28)
BUN: 11 mg/dL (ref 8–27)
Bilirubin Total: 0.6 mg/dL (ref 0.0–1.2)
CO2: 23 mmol/L (ref 20–29)
Calcium: 8.8 mg/dL (ref 8.7–10.3)
Chloride: 104 mmol/L (ref 96–106)
Creatinine, Ser: 0.85 mg/dL (ref 0.57–1.00)
GFR calc Af Amer: 79 mL/min/{1.73_m2} (ref >59)
GFR calc non Af Amer: 69 mL/min/{1.73_m2} (ref >59)
Globulin, Total: 2.4 g/dL (ref 1.5–4.5)
Glucose: 96 mg/dL (ref 65–99)
Potassium: 4.4 mmol/L (ref 3.5–5.2)
Sodium: 141 mmol/L (ref 134–144)
Total Protein: 6.4 g/dL (ref 6.0–8.5)

## 2019-06-07 LAB — CBC WITH DIFFERENTIAL/PLATELET
Basophils Absolute: 0 10*3/uL (ref 0.0–0.2)
Basos: 1 %
EOS (ABSOLUTE): 0.1 10*3/uL (ref 0.0–0.4)
Eos: 2 %
Hematocrit: 40 % (ref 34.0–46.6)
Hemoglobin: 13.6 g/dL (ref 11.1–15.9)
Immature Grans (Abs): 0 10*3/uL (ref 0.0–0.1)
Immature Granulocytes: 0 %
Lymphocytes Absolute: 1.5 10*3/uL (ref 0.7–3.1)
Lymphs: 27 %
MCH: 31.3 pg (ref 26.6–33.0)
MCHC: 34 g/dL (ref 31.5–35.7)
MCV: 92 fL (ref 79–97)
Monocytes Absolute: 0.6 10*3/uL (ref 0.1–0.9)
Monocytes: 10 %
Neutrophils Absolute: 3.3 10*3/uL (ref 1.4–7.0)
Neutrophils: 60 %
Platelets: 226 10*3/uL (ref 150–450)
RBC: 4.34 x10E6/uL (ref 3.77–5.28)
RDW: 11.9 % (ref 11.7–15.4)
WBC: 5.6 10*3/uL (ref 3.4–10.8)

## 2019-06-07 LAB — HEMOGLOBIN A1C
Est. average glucose Bld gHb Est-mCnc: 108 mg/dL
Hgb A1c MFr Bld: 5.4 % (ref 4.8–5.6)

## 2019-06-07 LAB — LIPID PANEL
Chol/HDL Ratio: 3.7 ratio (ref 0.0–4.4)
Cholesterol, Total: 203 mg/dL — ABNORMAL HIGH (ref 100–199)
HDL: 55 mg/dL (ref >39)
LDL Chol Calc (NIH): 134 mg/dL — ABNORMAL HIGH (ref 0–99)
Triglycerides: 80 mg/dL (ref 0–149)
VLDL Cholesterol Cal: 14 mg/dL (ref 5–40)

## 2019-06-07 LAB — TSH: TSH: 0.912 u[IU]/mL (ref 0.450–4.500)

## 2019-06-09 ENCOUNTER — Telehealth: Payer: Self-pay

## 2019-06-09 NOTE — Telephone Encounter (Signed)
Pt advised.   Thanks,   -Riko Lumsden  

## 2019-06-09 NOTE — Telephone Encounter (Signed)
-----   Message from Mar Daring, [place] sent at 06/09/2019 11:15 AM EST ----- Blood count is normal. Kidney and liver function are normal. Sodium, potassium and calcium are normal. A1c/Sugar is normal at 5.4. Cholesterol is essentially stable compared to last year and remains borderline elevated. Continue healthy lifestyle modifications including limiting fatty foods, processed foods, red meats and sugars. Thyroid is normal.

## 2019-06-12 IMAGING — CR DG ABDOMEN 2V
1 series · 3 of 3 positions shown · non-contrast
Comparison: None.

CLINICAL DATA: Pt states pain across lower abdomen with
constipation for 3 weeks. Pt does have blood in urine, pain is worse
on the left side. History of hysterectomy 0775.

EXAM:
ABDOMEN - 2 VIEW

[Series 1: dg abd 2 views · 0.14mm/px · 3 of 3 slices shown]
[im 1/3]
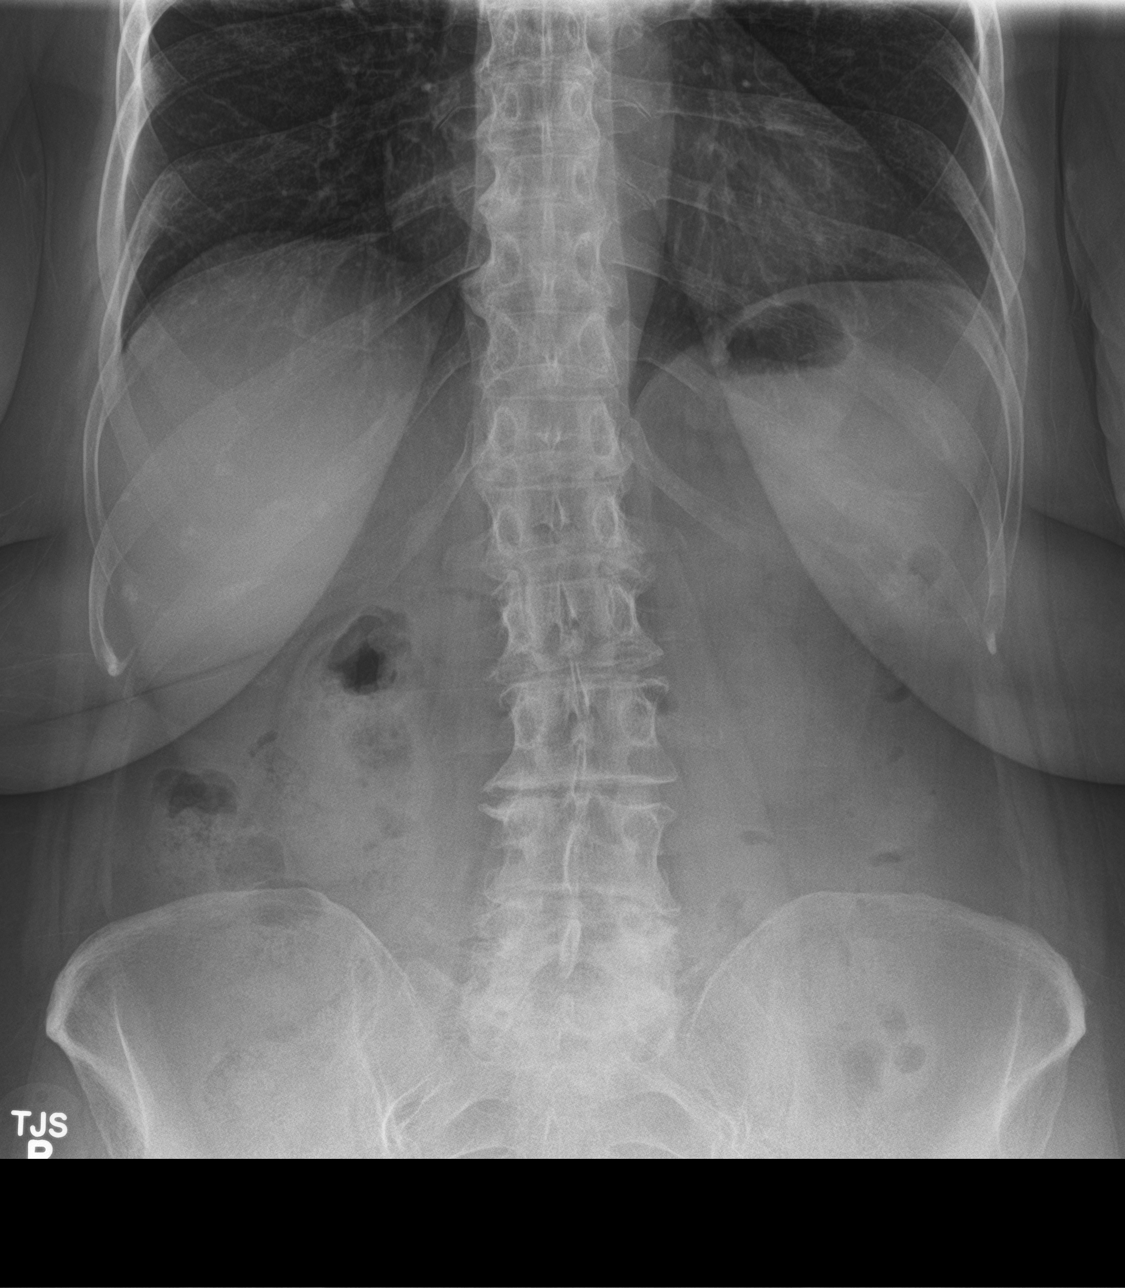
[im 2/3]
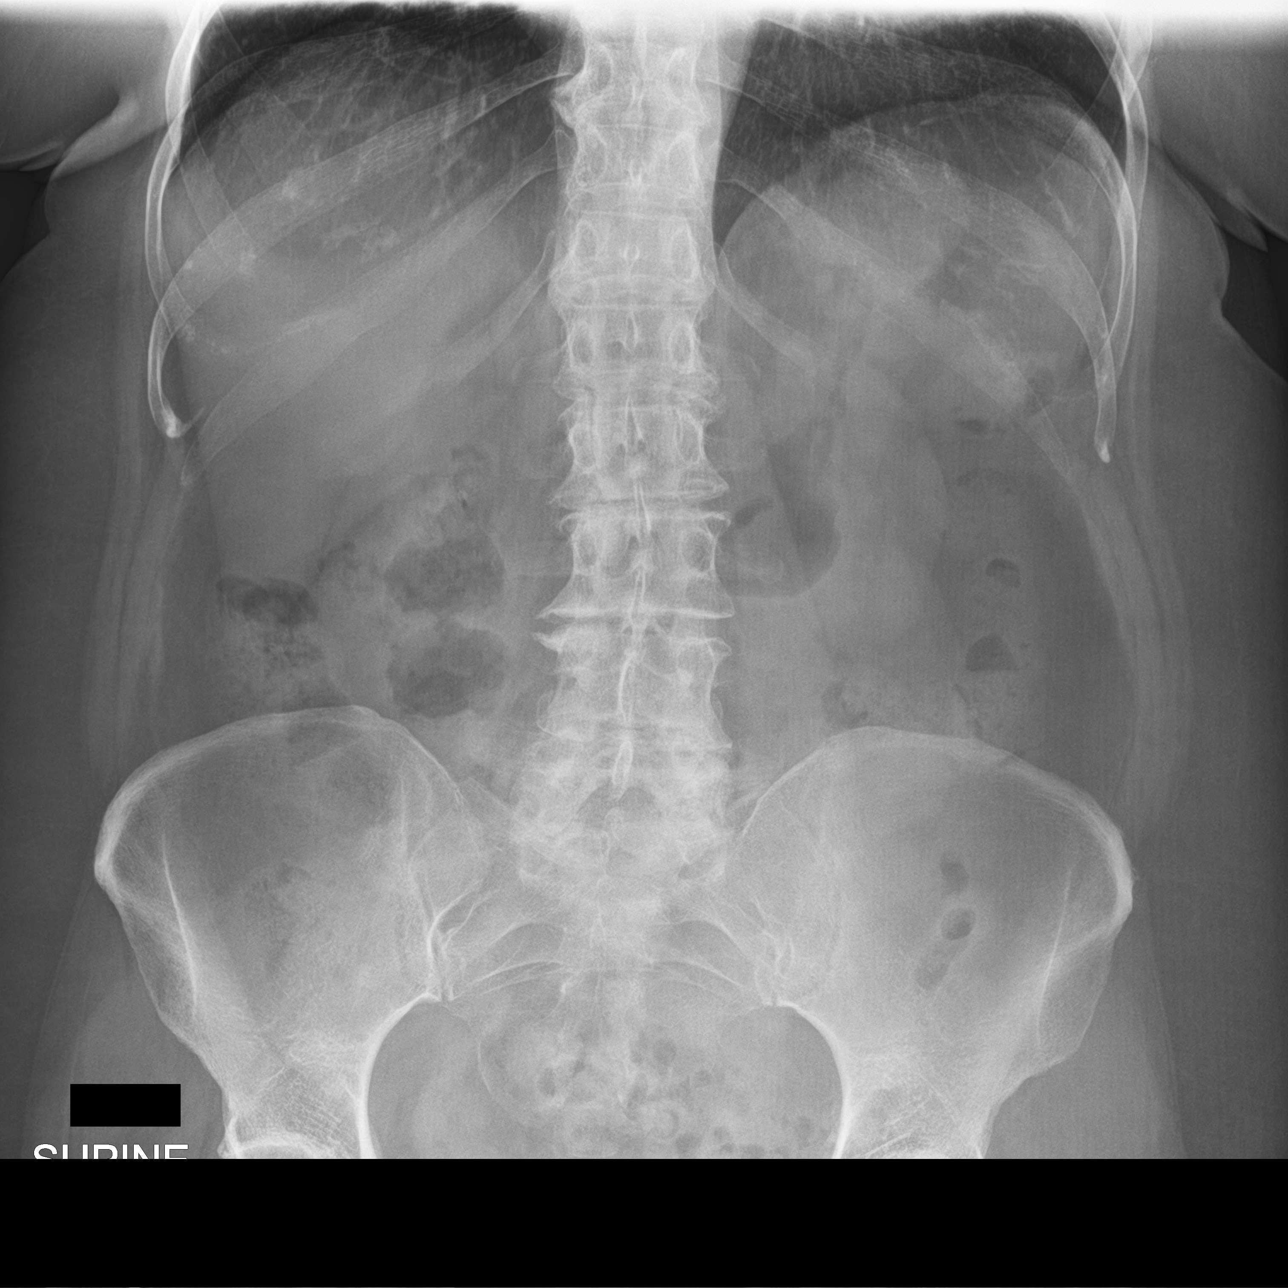
[im 3/3]
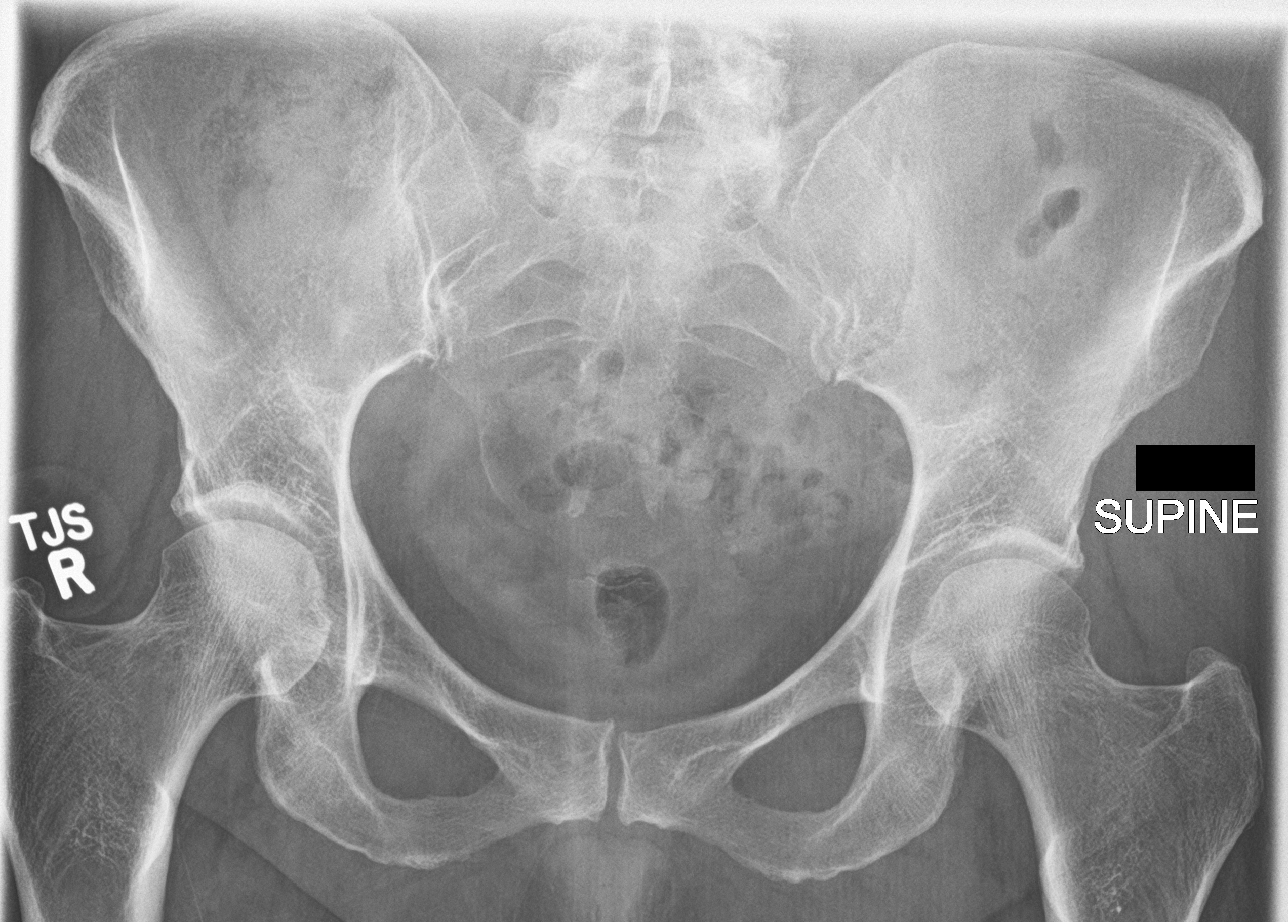

[3 of 3 positions shown; findings below may reference images not displayed]

FINDINGS: Bowel gas pattern is nonobstructive. No evidence of soft tissue mass
or abnormal fluid collection. No evidence of free intraperitoneal
air seen. No evidence of renal or ureteral calculi.

Degenerative changes within the slightly scoliotic lumbar spine,
mild to moderate in degree. No acute or suspicious osseous finding.
Lung bases appear clear.
IMPRESSION: 1. No acute findings. Nonobstructive bowel gas pattern. No evidence
of renal or ureteral calculi.
2. Degenerative changes of the scoliotic lumbar spine, mild to
moderate in degree.

## 2019-06-26 ENCOUNTER — Other Ambulatory Visit: Payer: Self-pay

## 2019-06-26 DIAGNOSIS — Z20822 Contact with and (suspected) exposure to covid-19: Secondary | ICD-10-CM

## 2019-06-28 LAB — NOVEL CORONAVIRUS, NAA: SARS-CoV-2, NAA: NOT DETECTED

## 2019-07-16 ENCOUNTER — Encounter: Payer: Self-pay | Admitting: Physician Assistant

## 2019-07-30 DIAGNOSIS — E039 Hypothyroidism, unspecified: Secondary | ICD-10-CM | POA: Diagnosis not present

## 2019-08-06 DIAGNOSIS — E039 Hypothyroidism, unspecified: Secondary | ICD-10-CM | POA: Diagnosis not present

## 2019-08-06 DIAGNOSIS — E669 Obesity, unspecified: Secondary | ICD-10-CM | POA: Diagnosis not present

## 2019-10-13 ENCOUNTER — Other Ambulatory Visit: Payer: Self-pay | Admitting: Physician Assistant

## 2019-10-13 DIAGNOSIS — N951 Menopausal and female climacteric states: Secondary | ICD-10-CM

## 2019-10-13 NOTE — Telephone Encounter (Signed)
Requested medication (s) are due for refill today: yes  Requested medication (s) are on the active medication list: yes  Last refill:  08/13/19  Future visit scheduled: yes  Notes to clinic:  not delegated   Requested Prescriptions  Pending Prescriptions Disp Refills   EST ESTROGENS-METHYLTEST HS 0.625-1.25 MG tablet [Pharmacy Med Name: EST ESTRGN METHTEST 0.625/1.25MG  TB] 30 tablet     Sig: TAKE 1 TABLET BY MOUTH EVERY 4 TO 5 DAYS      Not Delegated - OB/GYN:  Hormone Combinations - Controlled Failed - 10/13/2019 10:11 AM      Failed - This refill cannot be delegated      Passed - Mammogram is up-to-date per Health Maintenance      Passed - Last BP in normal range    BP Readings from Last 1 Encounters:  06/06/19 134/79          Passed - Valid encounter within last 12 months    Recent Outpatient Visits           4 months ago Encounter for annual physical exam   [facility], [place]ce Sorrel, [place]   1 year ago Annual physical exam   [facility] [place], [place]rel, [place]   1 year ago Possible urinary tract infection   [facility] [place], [place], [place]   2 years ago Acute pansinusitis, recurrence not specified   [facility] [place], [place], [place]   2 years ago Upper respiratory tract infection, unspecified type   [facility], [place], [place]

## 2019-10-15 ENCOUNTER — Telehealth: Payer: Self-pay | Admitting: Physician Assistant

## 2019-10-15 DIAGNOSIS — N951 Menopausal and female climacteric states: Secondary | ICD-10-CM

## 2019-10-15 NOTE — Telephone Encounter (Signed)
[facility] requesting refills on Est Estrgn Methtest 0.625/1.25mg 

## 2019-10-16 MED ORDER — EST ESTROGENS-METHYLTEST HS 0.625-1.25 MG PO TABS: 1.0000 | ORAL_TABLET | ORAL | 5 refills | Status: DC

## 2019-10-16 NOTE — Telephone Encounter (Signed)
This prescription was sent by you on 03/29 but it looks like the pharmacy didn't receive it. I tried to resend but my printed.

## 2019-10-27 ENCOUNTER — Other Ambulatory Visit: Payer: Self-pay | Admitting: Physician Assistant

## 2019-10-27 DIAGNOSIS — J4520 Mild intermittent asthma, uncomplicated: Secondary | ICD-10-CM

## 2019-10-27 MED ORDER — FLUTICASONE-SALMETEROL 250-50 MCG/DOSE IN AEPB: 1.0000 | INHALATION_SPRAY | Freq: Two times a day (BID) | RESPIRATORY_TRACT | 3 refills | Status: DC

## 2019-10-27 MED ORDER — FLUTICASONE-SALMETEROL 250-50 MCG/DOSE IN AEPB: 1.0000 | INHALATION_SPRAY | Freq: Two times a day (BID) | RESPIRATORY_TRACT | 5 refills | Status: DC

## 2019-10-27 NOTE — Progress Notes (Signed)
Changed symbicort to advair due to insurance coverage

## 2019-11-18 ENCOUNTER — Encounter: Payer: Self-pay | Admitting: Physician Assistant

## 2019-11-26 ENCOUNTER — Other Ambulatory Visit: Payer: Self-pay | Admitting: Physician Assistant

## 2019-11-26 DIAGNOSIS — F320 Major depressive disorder, single episode, mild: Secondary | ICD-10-CM

## 2019-12-25 DIAGNOSIS — R69 Illness, unspecified: Secondary | ICD-10-CM | POA: Diagnosis not present

## 2019-12-26 DIAGNOSIS — Z1231 Encounter for screening mammogram for malignant neoplasm of breast: Secondary | ICD-10-CM | POA: Diagnosis not present

## 2019-12-30 ENCOUNTER — Other Ambulatory Visit: Payer: Self-pay | Admitting: General Surgery

## 2019-12-30 DIAGNOSIS — Z1231 Encounter for screening mammogram for malignant neoplasm of breast: Secondary | ICD-10-CM | POA: Diagnosis not present

## 2019-12-30 DIAGNOSIS — K862 Cyst of pancreas: Secondary | ICD-10-CM

## 2019-12-30 NOTE — Progress Notes (Signed)
mri

## 2020-01-12 ENCOUNTER — Other Ambulatory Visit: Payer: Medicare HMO

## 2020-01-14 ENCOUNTER — Other Ambulatory Visit: Payer: Self-pay

## 2020-01-14 ENCOUNTER — Ambulatory Visit
Admission: RE | Admit: 2020-01-14 | Discharge: 2020-01-14 | Disposition: A | Discharge status: Home / Self Care | Payer: Medicare HMO | Source: Ambulatory Visit | Attending: General Surgery | Admitting: General Surgery

## 2020-01-14 DIAGNOSIS — N281 Cyst of kidney, acquired: Secondary | ICD-10-CM | POA: Diagnosis not present

## 2020-01-14 DIAGNOSIS — D1803 Hemangioma of intra-abdominal structures: Secondary | ICD-10-CM | POA: Diagnosis not present

## 2020-01-14 DIAGNOSIS — K862 Cyst of pancreas: Secondary | ICD-10-CM

## 2020-01-14 DIAGNOSIS — K8689 Other specified diseases of pancreas: Secondary | ICD-10-CM | POA: Diagnosis not present

## 2020-01-14 MED ORDER — GADOBUTROL 1 MMOL/ML IV SOLN
7.5000 mL | Freq: Once | INTRAVENOUS | Status: AC | PRN
  Administered 2020-01-14: 7.5 mL via INTRAVENOUS

## 2020-01-20 DIAGNOSIS — D2271 Melanocytic nevi of right lower limb, including hip: Secondary | ICD-10-CM | POA: Diagnosis not present

## 2020-01-20 DIAGNOSIS — D2261 Melanocytic nevi of right upper limb, including shoulder: Secondary | ICD-10-CM | POA: Diagnosis not present

## 2020-01-20 DIAGNOSIS — Z85828 Personal history of other malignant neoplasm of skin: Secondary | ICD-10-CM | POA: Diagnosis not present

## 2020-01-20 DIAGNOSIS — D225 Melanocytic nevi of trunk: Secondary | ICD-10-CM | POA: Diagnosis not present

## 2020-01-20 DIAGNOSIS — D2262 Melanocytic nevi of left upper limb, including shoulder: Secondary | ICD-10-CM | POA: Diagnosis not present

## 2020-01-20 DIAGNOSIS — L718 Other rosacea: Secondary | ICD-10-CM | POA: Diagnosis not present

## 2020-01-29 DIAGNOSIS — E039 Hypothyroidism, unspecified: Secondary | ICD-10-CM | POA: Diagnosis not present

## 2020-02-05 DIAGNOSIS — E669 Obesity, unspecified: Secondary | ICD-10-CM | POA: Diagnosis not present

## 2020-02-05 DIAGNOSIS — E039 Hypothyroidism, unspecified: Secondary | ICD-10-CM | POA: Diagnosis not present

## 2020-02-11 ENCOUNTER — Other Ambulatory Visit: Payer: Self-pay

## 2020-02-11 ENCOUNTER — Other Ambulatory Visit
Admission: RE | Admit: 2020-02-11 | Discharge: 2020-02-11 | Disposition: A | Discharge status: Home / Self Care | Payer: Medicare HMO | Source: Ambulatory Visit | Attending: General Surgery | Admitting: General Surgery

## 2020-02-11 DIAGNOSIS — Z20822 Contact with and (suspected) exposure to covid-19: Secondary | ICD-10-CM | POA: Diagnosis not present

## 2020-02-11 DIAGNOSIS — Z01812 Encounter for preprocedural laboratory examination: Secondary | ICD-10-CM | POA: Insufficient documentation

## 2020-02-11 LAB — SARS CORONAVIRUS 2 (TAT 6-24 HRS): SARS Coronavirus 2: NEGATIVE

## 2020-02-12 ENCOUNTER — Encounter: Payer: Self-pay | Admitting: General Surgery

## 2020-02-13 ENCOUNTER — Ambulatory Visit: Payer: Medicare HMO | Admitting: Anesthesiology

## 2020-02-13 ENCOUNTER — Other Ambulatory Visit: Payer: Self-pay

## 2020-02-13 ENCOUNTER — Encounter
Admission: RE | Disposition: A | Discharge status: Home / Self Care | Payer: Self-pay | Source: Home / Self Care | Attending: General Surgery

## 2020-02-13 ENCOUNTER — Encounter: Payer: Self-pay | Admitting: General Surgery

## 2020-02-13 ENCOUNTER — Ambulatory Visit
Admission: RE | Admit: 2020-02-13 | Discharge: 2020-02-13 | Disposition: A | Discharge status: Home / Self Care | Payer: Medicare HMO | Attending: General Surgery | Admitting: General Surgery

## 2020-02-13 DIAGNOSIS — Z86010 Personal history of colonic polyps: Secondary | ICD-10-CM | POA: Insufficient documentation

## 2020-02-13 DIAGNOSIS — Z7982 Long term (current) use of aspirin: Secondary | ICD-10-CM | POA: Insufficient documentation

## 2020-02-13 DIAGNOSIS — Z7951 Long term (current) use of inhaled steroids: Secondary | ICD-10-CM | POA: Diagnosis not present

## 2020-02-13 DIAGNOSIS — E039 Hypothyroidism, unspecified: Secondary | ICD-10-CM | POA: Diagnosis not present

## 2020-02-13 DIAGNOSIS — Z79890 Hormone replacement therapy (postmenopausal): Secondary | ICD-10-CM | POA: Insufficient documentation

## 2020-02-13 DIAGNOSIS — R69 Illness, unspecified: Secondary | ICD-10-CM | POA: Diagnosis not present

## 2020-02-13 DIAGNOSIS — Z882 Allergy status to sulfonamides status: Secondary | ICD-10-CM | POA: Diagnosis not present

## 2020-02-13 DIAGNOSIS — F419 Anxiety disorder, unspecified: Secondary | ICD-10-CM | POA: Insufficient documentation

## 2020-02-13 DIAGNOSIS — K5730 Diverticulosis of large intestine without perforation or abscess without bleeding: Secondary | ICD-10-CM | POA: Insufficient documentation

## 2020-02-13 DIAGNOSIS — K5790 Diverticulosis of intestine, part unspecified, without perforation or abscess without bleeding: Secondary | ICD-10-CM | POA: Diagnosis not present

## 2020-02-13 DIAGNOSIS — Z85828 Personal history of other malignant neoplasm of skin: Secondary | ICD-10-CM | POA: Diagnosis not present

## 2020-02-13 DIAGNOSIS — Z881 Allergy status to other antibiotic agents status: Secondary | ICD-10-CM | POA: Insufficient documentation

## 2020-02-13 DIAGNOSIS — J45909 Unspecified asthma, uncomplicated: Secondary | ICD-10-CM | POA: Diagnosis not present

## 2020-02-13 DIAGNOSIS — E669 Obesity, unspecified: Secondary | ICD-10-CM | POA: Insufficient documentation

## 2020-02-13 DIAGNOSIS — Z79899 Other long term (current) drug therapy: Secondary | ICD-10-CM | POA: Diagnosis not present

## 2020-02-13 DIAGNOSIS — I10 Essential (primary) hypertension: Secondary | ICD-10-CM | POA: Diagnosis not present

## 2020-02-13 DIAGNOSIS — Z1211 Encounter for screening for malignant neoplasm of colon: Secondary | ICD-10-CM | POA: Diagnosis not present

## 2020-02-13 DIAGNOSIS — Z6832 Body mass index (BMI) 32.0-32.9, adult: Secondary | ICD-10-CM | POA: Diagnosis not present

## 2020-02-13 HISTORY — DX: Other specified postprocedural states: Z98.890

## 2020-02-13 HISTORY — DX: Nausea with vomiting, unspecified: R11.2

## 2020-02-13 HISTORY — PX: COLONOSCOPY WITH PROPOFOL: SHX5780

## 2020-02-13 HISTORY — DX: Personal history of other diseases of the digestive system: Z87.19

## 2020-02-13 SURGERY — COLONOSCOPY WITH PROPOFOL
Anesthesia: General

## 2020-02-13 MED ORDER — PROPOFOL 10 MG/ML IV BOLUS
INTRAVENOUS | Status: AC
  Filled 2020-02-13: qty 20

## 2020-02-13 MED ORDER — SODIUM CHLORIDE 0.9 % IV SOLN: INTRAVENOUS | Status: DC

## 2020-02-13 MED ORDER — PROPOFOL 10 MG/ML IV BOLUS
INTRAVENOUS | Status: DC | PRN
  Administered 2020-02-13: 50 mg via INTRAVENOUS

## 2020-02-13 MED ORDER — PROPOFOL 500 MG/50ML IV EMUL
INTRAVENOUS | Status: AC
  Filled 2020-02-13: qty 50

## 2020-02-13 MED ORDER — PROPOFOL 500 MG/50ML IV EMUL
INTRAVENOUS | Status: DC | PRN
  Administered 2020-02-13: 140 ug/kg/min via INTRAVENOUS

## 2020-02-13 NOTE — Transfer of Care (Signed)
Immediate Anesthesia Transfer of Care Note  Patient: Meredith Scott  Procedure(s) Performed: COLONOSCOPY WITH PROPOFOL (N/A )  Patient Location: PACU and Endoscopy Unit  Anesthesia Type:General  Level of Consciousness: drowsy  Airway & Oxygen Therapy: Patient Spontanous Breathing  Post-op Assessment: Report given to RN  Post vital signs: stable  Last Vitals:  Vitals Value Taken Time  BP    Temp    Pulse    Resp    SpO2      Last Pain:  Vitals:   02/13/20 0724  TempSrc: Temporal  PainSc: 0-No pain         Complications: No complications documented.

## 2020-02-13 NOTE — Anesthesia Preprocedure Evaluation (Addendum)
Anesthesia Evaluation  Patient identified by MRN, date of birth, ID band Patient awake    Reviewed: Allergy & Precautions, H&P , NPO status , Patient's Chart, lab work & pertinent test results  History of Anesthesia Complications (+) PONV  Airway Mallampati: II  TM Distance: >3 FB     Dental  (+) Teeth Intact   Pulmonary asthma ,    breath sounds clear to auscultation       Cardiovascular hypertension, (-) angina(-) Past MI  Rhythm:regular Rate:Normal     Neuro/Psych Anxiety negative neurological ROS     GI/Hepatic Neg liver ROS, hiatal hernia, GERD  ,  Endo/Other  Hypothyroidism   Renal/GU negative Renal ROS  negative genitourinary   Musculoskeletal   Abdominal   Peds  Hematology negative hematology ROS (+)   Anesthesia Other Findings Past Medical History: No date: Anxiety No date: Asthma 2011: Asymptomatic varicose veins 2013: Breast screening, unspecified No date: Colon polyp 2013: Diffuse cystic mastopathy No date: Hernia of abdominal wall No date: History of hiatal hernia No date: Hypertension No date: Hypothyroidism     Comment:  Dr Eddie Dibbles 2013: Obesity, unspecified No date: PONV (postoperative nausea and vomiting) 2013: Screening for obesity 2018: Skin cancer of face     Comment:  squamous/ left cheek 2013: Special screening for malignant neoplasms, colon  Past Surgical History: 1994: ABDOMINAL HYSTERECTOMY 1997?: BREAST CYST ASPIRATION; Right     Comment:  breast aspiration d/t infected milk gland 2011: BREAST MASS EXCISION; Left 2005: COLONOSCOPY     Comment:  done by Dr. Jamal Collin 01/27/2015: COLONOSCOPY; N/A     Comment:  Procedure: COLONOSCOPY;  Surgeon: Christene Lye,              MD;  Location: [facility] ENDOSCOPY;  Service: Endoscopy;                Laterality: N/A; 2007: DILATION AND CURETTAGE OF UTERUS No date: FINE NEEDLE ASPIRATION No date: FOOT SURGERY; Right 2010: LIPOMA  EXCISION     Comment:  breast bone 12/2016: SKIN CANCER EXCISION; Left     Comment:  under left eye, [facility] No date: TONGUE BIOPSY     Reproductive/Obstetrics negative OB ROS                            Anesthesia Physical Anesthesia Plan  ASA: II  Anesthesia Plan: General   Post-op Pain Management:    Induction:   PONV Risk Score and Plan: Propofol infusion and TIVA  Airway Management Planned:   Additional Equipment:   Intra-op Plan:   Post-operative Plan:   Informed Consent: I have reviewed the patients History and Physical, chart, labs and discussed the procedure including the risks, benefits and alternatives for the proposed anesthesia with the patient or authorized representative who has indicated his/her understanding and acceptance.     Dental Advisory Given  Plan Discussed with: Anesthesiologist, CRNA and Surgeon  Anesthesia Plan Comments:         Anesthesia Quick Evaluation

## 2020-02-13 NOTE — Op Note (Signed)
[facility] Gastroenterology Patient Name: Meredith Scott Procedure Date: 02/13/2020 7:35 AM MRN: [id number] Account #: [id number] Date of Birth: 1945-07-19 Admit Type: Outpatient Age: 74 Room: [facility] ENDO ROOM 1 Gender: Female Note Status: Finalized Procedure:             Colonoscopy Indications:           High risk colon cancer surveillance: Personal history                         of colonic polyps Providers:             Robert Bellow, MD Medicines:             Monitored Anesthesia Care Complications:         No immediate complications. Procedure:             Pre-Anesthesia Assessment:                        - Prior to the procedure, a History and Physical was                         performed, and patient medications, allergies and                         sensitivities were reviewed. The patient's tolerance                         of previous anesthesia was reviewed.                        - The risks and benefits of the procedure and the                         sedation options and risks were discussed with the                         patient. All questions were answered and informed                         consent was obtained.                        After obtaining informed consent, the colonoscope was                         passed under direct vision. Throughout the procedure,                         the patient's blood pressure, pulse, and oxygen                         saturations were monitored continuously. The                         Colonoscope was introduced through the anus and                         advanced to the the cecum, identified by appendiceal  orifice and ileocecal valve. The colonoscopy was                         somewhat difficult due to multiple diverticula in the                         colon. Successful completion of the procedure was                         aided by using manual pressure. The patient  tolerated                         the procedure well. The quality of the bowel                         preparation was good. Findings:      Multiple small and large-mouthed diverticula were found in the sigmoid       colon and descending colon.      The retroflexed view of the distal rectum and anal verge was normal and       showed no anal or rectal abnormalities. Impression:            - Diverticulosis in the sigmoid colon and in the                         descending colon.                        - The distal rectum and anal verge are normal on                         retroflexion view.                        - No specimens collected. Recommendation:        - Repeat colonoscopy in 5 years for surveillance. Procedure Code(s):     --- Professional ---                        [phone], Colonoscopy, flexible; diagnostic, including                         collection of specimen(s) by brushing or washing, when                         performed (separate procedure) Diagnosis Code(s):     --- Professional ---                        Z86.010, Personal history of colonic polyps                        K57.30, Diverticulosis of large intestine without                         perforation or abscess without bleeding CPT copyright 2019 American Medical Association. All rights reserved. The codes documented in this report are preliminary and upon coder review may  be revised to meet current compliance requirements. Robert Bellow, MD 02/13/2020 8:22:47 AM This report has been  signed electronically. Number of Addenda: 0 Note Initiated On: 02/13/2020 7:35 AM Scope Withdrawal Time: 0 hours 9 minutes 24 seconds  Total Procedure Duration: 0 hours 17 minutes 12 seconds  Estimated Blood Loss:  Estimated blood loss: none.      [facility]

## 2020-02-13 NOTE — H&P (Signed)
Meredith Scott is an 74 y.o. female.   Chief Complaint: History of colon polyps  HPI: Colonoscopy in 2016 showed a small tubular adenoma in the hepatic flexure.  For follow up exam.   Past Medical History:  Diagnosis Date  . Anxiety   . Asthma   . Asymptomatic varicose veins 2011  . Breast screening, unspecified 2013  . Colon polyp   . Diffuse cystic mastopathy 2013  . Hernia of abdominal wall   . History of hiatal hernia   . Hypertension   . Hypothyroidism    Dr Eddie Dibbles  . Obesity, unspecified 2013  . PONV (postoperative nausea and vomiting)   . Screening for obesity 2013  . Skin cancer of face 2018   squamous/ left cheek  . Special screening for malignant neoplasms, colon 2013    Past Surgical History:  Procedure Laterality Date  . ABDOMINAL HYSTERECTOMY  1994  . BREAST CYST ASPIRATION Right 1997?   breast aspiration d/t infected milk gland  . BREAST MASS EXCISION Left 2011  . COLONOSCOPY  2005   done by Dr. Jamal Collin  . COLONOSCOPY N/A 01/27/2015   Procedure: COLONOSCOPY;  Surgeon: Christene Lye, MD;  Location: [facility] ENDOSCOPY;  Service: Endoscopy;  Laterality: N/A;  . DILATION AND CURETTAGE OF UTERUS  2007  . FINE NEEDLE ASPIRATION    . FOOT SURGERY Right   . LIPOMA EXCISION  2010   breast bone  . SKIN CANCER EXCISION Left 12/2016   under left eye, [facility]  . TONGUE BIOPSY      Family History  Problem Relation Age of Onset  . Liver cancer Mother   . Thyroid disease Mother   . Breast cancer Other   . Diabetes Father   . Healthy Sister   . Dementia Sister   . Healthy Brother   . Lung cancer Maternal Uncle    Social History:  reports that she has never smoked. She has never used smokeless tobacco. She reports that she does not drink alcohol and does not use drugs.  Allergies:  Allergies  Allergen Reactions  . Shellfish Allergy Other (See Comments)    Throat closes  . Sulfa Antibiotics Swelling  . Neosporin [Neomycin-Bacitracin  Zn-Polymyx] Rash    Medications Prior to Admission  Medication Sig Dispense Refill  . ascorbic acid (C 500/ROSE HIPS) 500 MG tablet Take by mouth.    Marland Kitchen aspirin 81 MG tablet Take 81 mg by mouth daily.    . beclomethasone (BECONASE-AQ) 42 MCG/SPRAY nasal spray Place 1 spray into both nostrils 2 (two) times daily. As needed (seasonal)    . calcium-vitamin D (OSCAL 500/200 D-3) 500-200 MG-UNIT tablet Take 1 tablet by mouth daily with breakfast.     . COMBIVENT RESPIMAT 20-100 MCG/ACT AERS respimat USE 1 PUFF EVERY 4 HOURS AS NEEDED FOR WHEEZING 4 Inhaler 3  . escitalopram (LEXAPRO) 10 MG tablet TAKE 1/2 TABLET BY MOUTH DAILY 45 tablet 0  . estrogen-methylTESTOSTERone (EST ESTROGENS-METHYLTEST HS) 0.625-1.25 MG tablet Take 1 tablet by mouth as directed. TAKE 1 TABLET BY MOUTH EVERY 6 DAYS 30 tablet 5  . Fish Oil OIL by Does not apply route daily.     . Fluticasone-Salmeterol (ADVAIR DISKUS) 250-50 MCG/DOSE AEPB Inhale 1 puff into the lungs in the morning and at bedtime. Advair diskus 60 each 5  . levothyroxine (SYNTHROID, LEVOTHROID) 25 MCG tablet Take 25 mcg by mouth daily before breakfast. 2 on Sunday and Thursday    . metroNIDAZOLE (METROCREAM) 0.75 %  cream APP EXT TO FACE ONCE DAILY UTD    . Wheat Dextrin (BENEFIBER PO) Take by mouth daily. Or as EOD    . Ascorbic Acid (VITAMIN C PO) Take by mouth daily.     Marland Kitchen loratadine (CLARITIN) 10 MG tablet Take 1 tablet (10 mg total) by mouth daily as needed. (Patient not taking: Reported on 02/13/2020) 30 tablet 6  . Multiple Vitamin (MULTIVITAMIN) capsule Take 1 capsule by mouth daily. (Patient not taking: Reported on 02/13/2020)      Results for orders placed or performed during the hospital encounter of 02/11/20 (from the past 48 hour(s))  SARS CORONAVIRUS 2 (TAT 6-24 HRS) Nasopharyngeal Nasopharyngeal Swab     Status: None   Collection Time: 02/11/20  9:00 AM   Specimen: Nasopharyngeal Swab  Result Value Ref Range   SARS Coronavirus 2 NEGATIVE  NEGATIVE    Comment: (NOTE) SARS-CoV-2 target nucleic acids are NOT DETECTED.  The SARS-CoV-2 RNA is generally detectable in upper and lower respiratory specimens during the acute phase of infection. Negative results do not preclude SARS-CoV-2 infection, do not rule out co-infections with other pathogens, and should not be used as the sole basis for treatment or other patient management decisions. Negative results must be combined with clinical observations, patient history, and epidemiological information. The expected result is Negative.  Fact Sheet for Patients: SugarRoll.be  Fact Sheet for Healthcare Providers: https://www.woods-mathews.com/  This test is not yet approved or cleared by the [place] FDA and  has been authorized for detection and/or diagnosis of SARS-CoV-2 by FDA under an Emergency Use Authorization (EUA). This EUA will remain  in effect (meaning this test can be used) for the duration of the COVID-19 declaration under Se ction 564(b)(1) of the Act, 21 U.S.C. section 360bbb-3(b)(1), unless the authorization is terminated or revoked sooner.  Performed at [facility], [place] [address], [place], [place] [address]    No results found.  Review of Systems  Blood pressure (!) 139/64, pulse 68, temperature (!) 96.8 F (36 C), temperature source Temporal, resp. rate 18, height 5\' 3"  (1.6 m), weight 82.6 kg, SpO2 100 %. Physical Exam Cardiovascular:     Rate and Rhythm: Normal rate and regular rhythm.  Pulmonary:     Effort: Pulmonary effort is normal.     Breath sounds: Normal breath sounds.  Musculoskeletal:     Cervical back: Normal range of motion and neck supple.  Neurological:     Mental Status: She is alert.      Assessment/Plan  Proceed with colonoscopy.   Robert Bellow, MD 02/13/2020, 7:51 AM

## 2020-02-14 NOTE — Anesthesia Postprocedure Evaluation (Signed)
Anesthesia Post Note  Patient: Meredith Scott  Procedure(s) Performed: COLONOSCOPY WITH PROPOFOL (N/A )  Patient location during evaluation: PACU Anesthesia Type: General Level of consciousness: awake and alert Pain management: pain level controlled Vital Signs Assessment: post-procedure vital signs reviewed and stable Respiratory status: spontaneous breathing, nonlabored ventilation and respiratory function stable Cardiovascular status: blood pressure returned to baseline and stable Postop Assessment: no apparent nausea or vomiting Anesthetic complications: no   No complications documented.   Last Vitals:  Vitals:   02/13/20 0834 02/13/20 0844  BP: 107/67 124/69  Pulse: 64 60  Resp: 12 21  Temp:    SpO2: 99% 98%    Last Pain:  Vitals:   02/14/20 0746  TempSrc:   PainSc: 0-No pain                 Brett Canales Jahzier Villalon

## 2020-02-26 ENCOUNTER — Other Ambulatory Visit: Payer: Self-pay | Admitting: Physician Assistant

## 2020-02-26 DIAGNOSIS — F320 Major depressive disorder, single episode, mild: Secondary | ICD-10-CM

## 2020-03-04 IMAGING — MR MR ABDOMEN WO/W CM
16 of 17 series · 45 of 48 positions shown · IV contrast (Gadavist)
Comparison: No prior abdominal MRI. CT the abdomen and pelvis
03/14/2012. Abdominal ultrasound 08/29/2018.

CLINICAL DATA: 72-year-old female with history of indeterminate
liver lesion noted on prior CT and ultrasound examinations.
Follow-up study.

EXAM:
MRI ABDOMEN WITHOUT AND WITH CONTRAST
TECHNIQUE: Multiplanar multisequence MR imaging of the abdomen was performed
both before and after the administration of intravenous contrast.
CONTRAST:  8 mL of Gadavist.

[Series 2: T2 · coronal · 6.0mm · 1.19mm/px · 2 of 30 slices shown (1 of 2)]
[im 1/30]
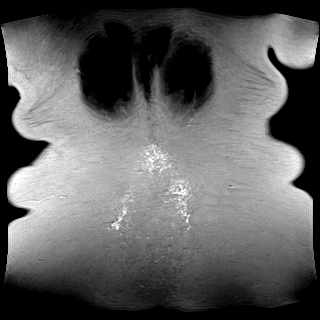
[im 30/30]
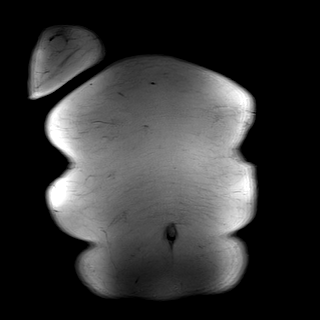

[Series 3: T2 · axial · 6.0mm · 1.19mm/px · z∈[-67,+157]mm · 2 of 32 slices shown (2 of 2)]
[im 1/32]
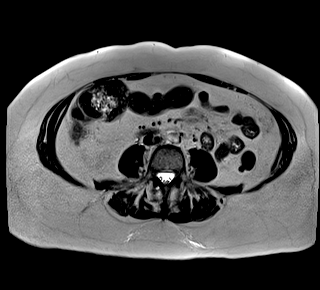
[im 32/32]
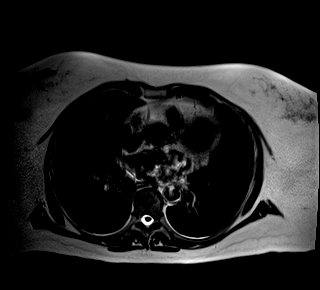

[Series 5: T2 fat-sat · axial · 6.0mm · 1.19mm/px · z∈[-82,+156]mm · 2 of 34 slices shown]
[im 1/34]
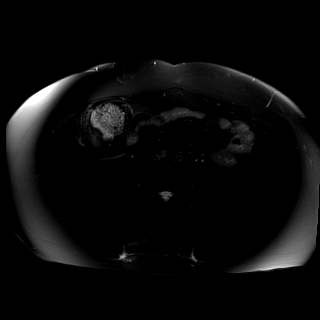
[im 34/34]
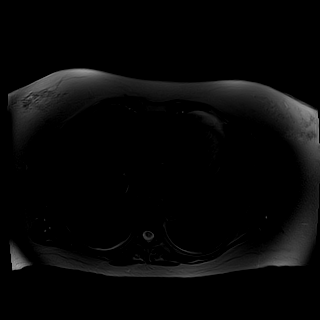

[Series 6: ax dwi_tracew · axial · 6.0mm · 1.42mm/px · z∈[-82,+156]mm · 5 of 102 slices shown]
[im 1/102]
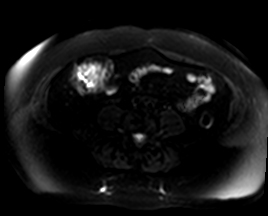
[im 26/102]
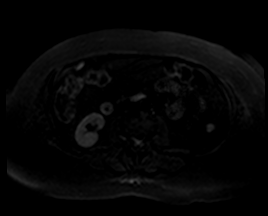
[im 51/102]
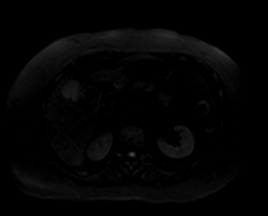
[im 76/102]
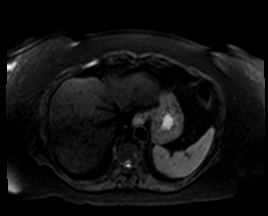
[im 102/102]
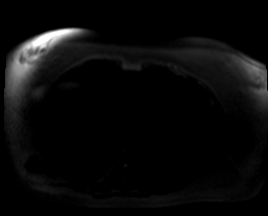

[Series 7: ax dwi_adc · axial · 6.0mm · 1.42mm/px · z∈[-82,+156]mm · 2 of 34 slices shown]
[im 1/34]
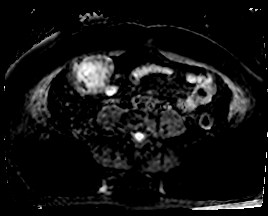
[im 34/34]
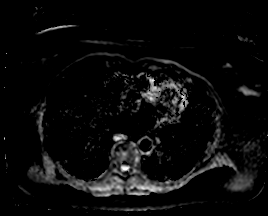

[Series 8: T1 · axial · 6.0mm · 0.74mm/px · z∈[-67,+157]mm · 4 of 64 slices shown]
[im 1/64]
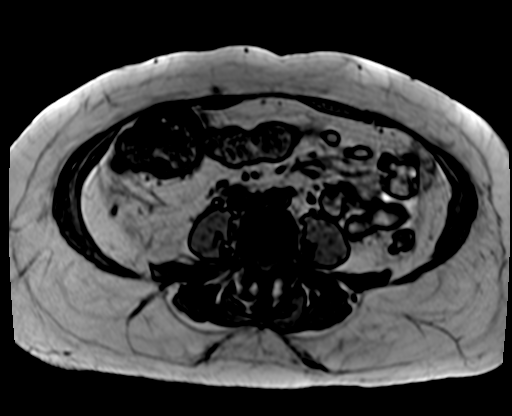
[im 22/64]
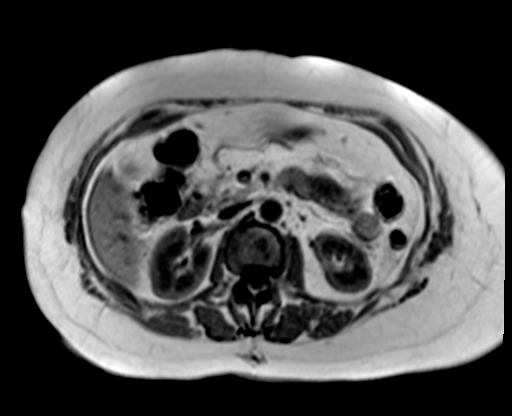
[im 43/64]
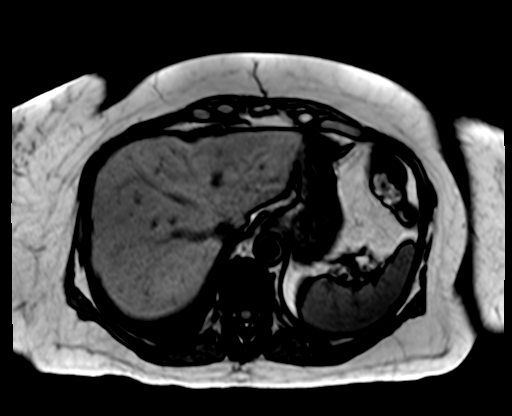
[im 64/64]
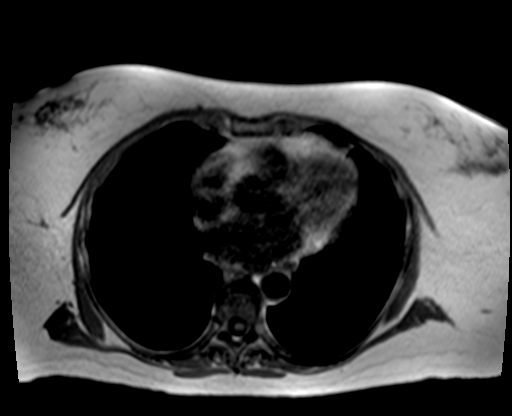

[Series 9: bSSFP · axial · 6.0mm · 0.74mm/px · 1 of 32 slices shown]
[im 1/32]
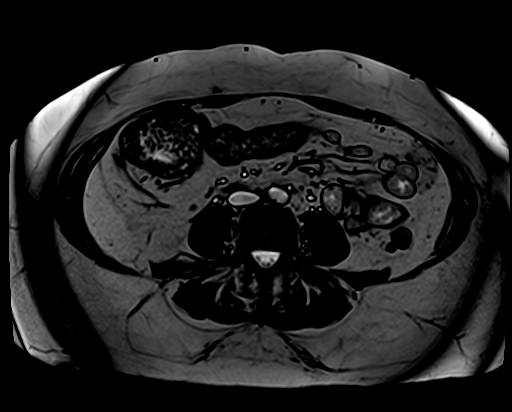

[Series 10: T1 dynamic fat-sat · axial · non-contrast · 3.0mm · 1.19mm/px · z∈[-82,+155]mm · 3 of 80 slices shown (1 of 4)]
[im 1/80]
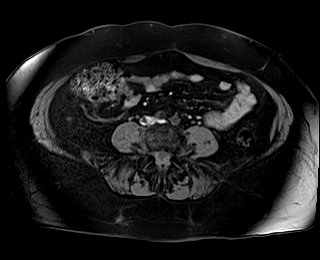
[im 40/80]
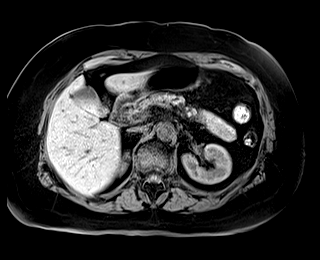
[im 80/80]
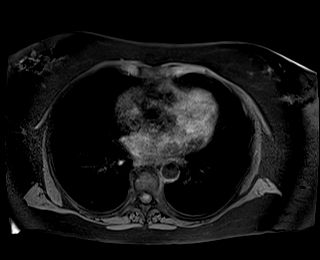

[Series 11: T1 dynamic fat-sat post-contrast · axial · 3.0mm · 1.19mm/px · z∈[-82,+155]mm · 3 of 80 slices shown (1 of 4)]
[im 1/80]
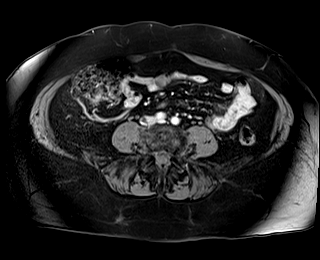
[im 40/80]
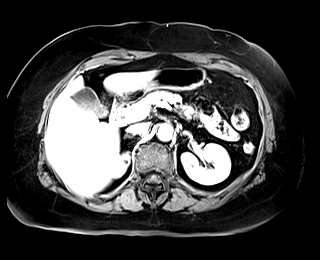
[im 80/80]
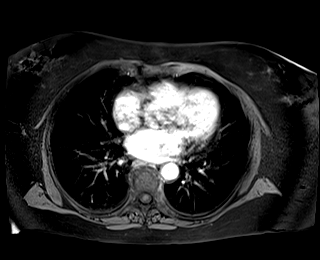

[Series 12: T1 dynamic fat-sat · axial · 3.0mm · 1.19mm/px · z∈[-82,+155]mm · 3 of 80 slices shown (2 of 4)]
[im 1/80]
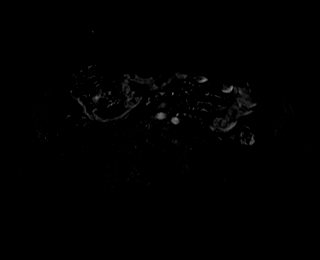
[im 40/80]
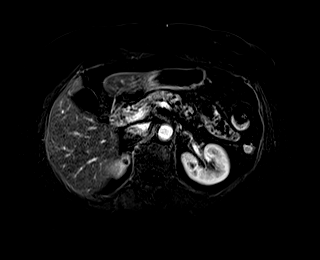
[im 80/80]
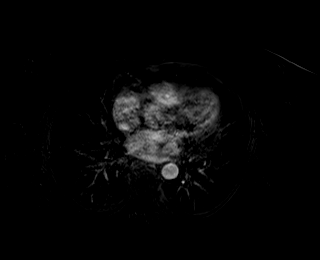

[Series 13: T1 dynamic fat-sat post-contrast · axial · 3.0mm · 1.19mm/px · z∈[-82,+155]mm · 3 of 80 slices shown (2 of 4)]
[im 1/80]
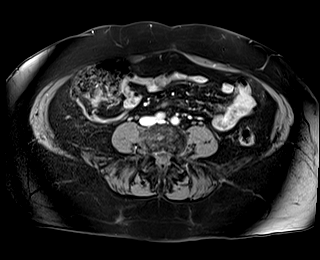
[im 40/80]
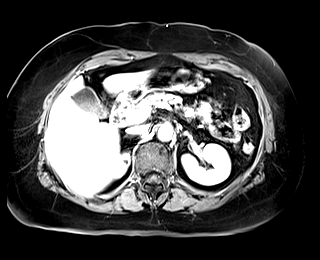
[im 80/80]
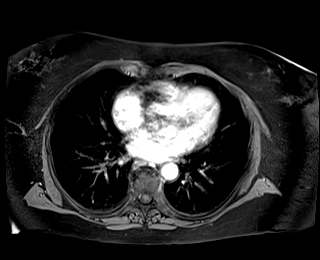

[Series 14: T1 dynamic fat-sat · axial · 3.0mm · 1.19mm/px · z∈[-82,+155]mm · 3 of 80 slices shown (3 of 4)]
[im 1/80]
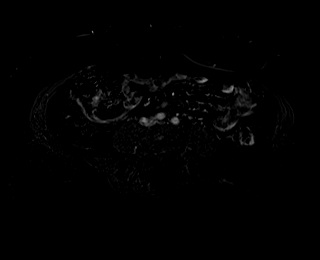
[im 40/80]
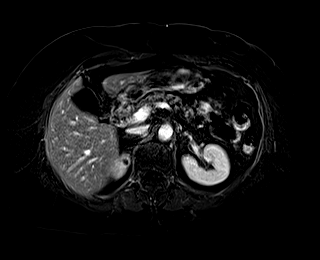
[im 80/80]
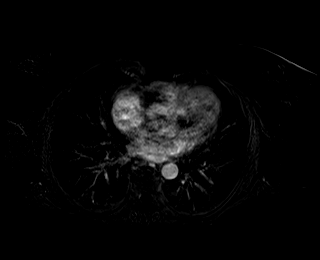

[Series 15: T1 dynamic fat-sat post-contrast · axial · 3.0mm · 1.19mm/px · z∈[-82,+155]mm · 3 of 80 slices shown (3 of 4)]
[im 1/80]
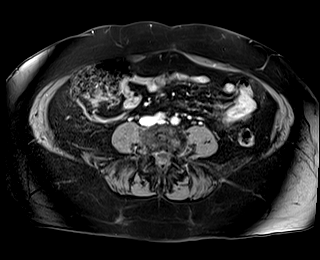
[im 40/80]
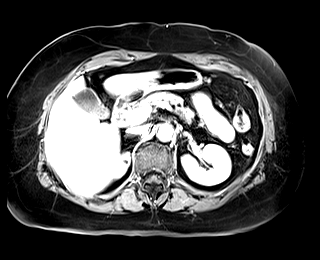
[im 80/80]
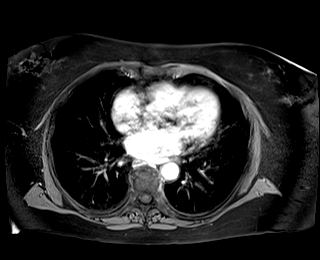

[Series 16: T1 dynamic fat-sat · axial · 3.0mm · 1.19mm/px · z∈[-82,+155]mm · 3 of 80 slices shown (4 of 4)]
[im 1/80]
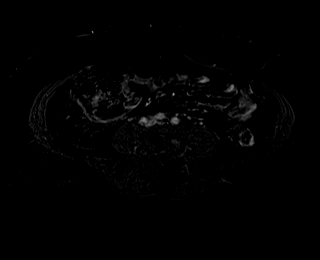
[im 40/80]
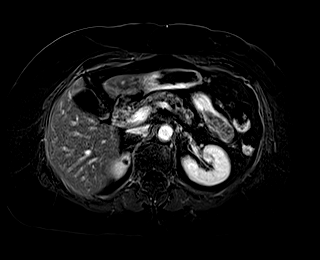
[im 80/80]
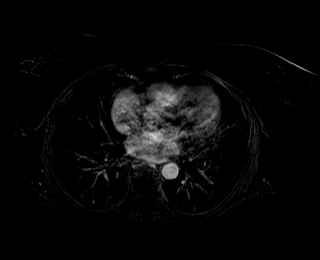

[Series 17: T1 dynamic post-contrast · coronal · 3.0mm · 1.31mm/px · 3 of 72 slices shown]
[im 1/72]
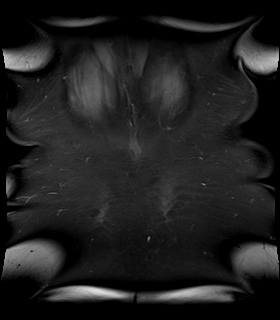
[im 36/72]
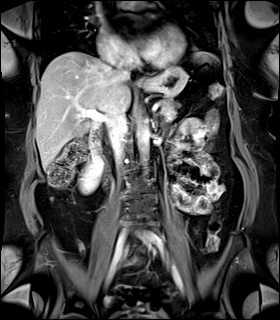
[im 72/72]
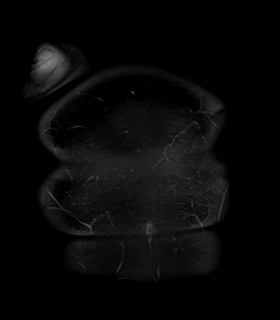

[Series 18: T1 dynamic fat-sat post-contrast · axial · 3.0mm · 1.19mm/px · z∈[-82,+155]mm · 3 of 80 slices shown (4 of 4)]
[im 1/80]
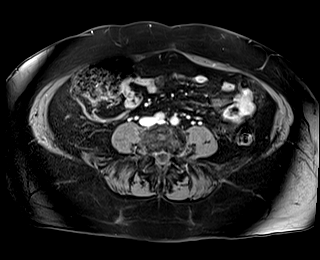
[im 40/80]
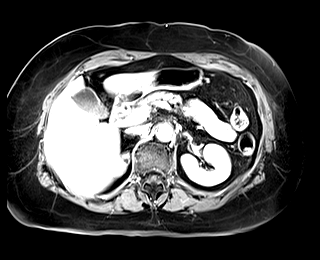
[im 80/80]
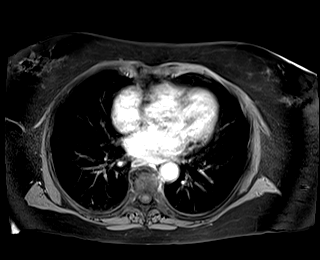

[45 of 48 positions shown; findings below may reference images not displayed]

FINDINGS: Lower chest: Unremarkable.

Hepatobiliary: In segment 7 of the liver the area of concern on
prior CT examination demonstrates no focal lesion on pre gadolinium
T1 or T2 weighted images. On post gadolinium imaging there is a
subtle area of initial hypoperfusion which demonstrates some
peripheral early enhancement with progressive centripetal filling,
ovoid in shape and estimated to measure approximately 2.1 x 0.9 cm
(axial image 32 of series 3), most compatible with a cavernous
hemangioma. No other suspicious hepatic lesions are noted. No intra
or extrahepatic biliary ductal dilatation. Tiny filling defects
lying dependently in the gallbladder compatible with cholelithiasis.
Gallbladder is only moderately distended without focal wall
thickening or surrounding inflammatory changes. No pericholecystic
fluid.

Pancreas: In the body of the pancreas there is an 8 x 4 mm T1
hypointense, T2 hyperintense, nonenhancing lesion (axial image 18 of
series 3). No other pancreatic mass. No pancreatic ductal
dilatation. No pancreatic or peripancreatic fluid or inflammatory
changes.

Spleen:  Unremarkable.

Adrenals/Urinary Tract: Multiple subcentimeter T1 hypointense, T2
hyperintense, nonenhancing renal lesions in the right kidney,
compatible with tiny simple cysts. Left kidney and bilateral adrenal
glands are normal in appearance. No hydroureteronephrosis in the
visualized portions of the abdomen.

Stomach/Bowel: Visualized portions are unremarkable.

Vascular/Lymphatic: No aneurysm identified in the visualized
abdominal vasculature. No lymphadenopathy noted in the abdomen.

Other: No significant volume of ascites noted in the visualized
portions of the peritoneal cavity.

Musculoskeletal: No aggressive appearing osseous lesions are noted
in the visualized portions of the skeleton.
IMPRESSION: 1. The area of concern in the right lobe of the liver on recent CT
and ultrasound examinations corresponds to a small cavernous
hemangioma.
2. 8 x 4 mm small cystic lesion in the body of the pancreas. This is
favored to be benign, potentially a tiny pancreatic pseudocyst.
Repeat evaluation with pancreatic protocol CT scan or repeat
abdominal MRI with and without IV gadolinium with MRCP is
recommended in 12 months to ensure the stability or resolution of
this finding. This recommendation follows ACR consensus guidelines:
Management of Incidental Pancreatic Cysts: A White Paper of the ACR
Incidental Findings Committee. [HOSPITAL] 5356;[DATE].
3. Cholelithiasis without evidence of acute cholecystitis at this
time.
4. Additional incidental findings, as above.

## 2020-04-06 DIAGNOSIS — H6121 Impacted cerumen, right ear: Secondary | ICD-10-CM | POA: Diagnosis not present

## 2020-04-06 DIAGNOSIS — H903 Sensorineural hearing loss, bilateral: Secondary | ICD-10-CM | POA: Diagnosis not present

## 2020-04-06 DIAGNOSIS — J0391 Acute recurrent tonsillitis, unspecified: Secondary | ICD-10-CM | POA: Diagnosis not present

## 2020-04-07 ENCOUNTER — Ambulatory Visit: Payer: Medicare HMO

## 2020-04-14 ENCOUNTER — Ambulatory Visit (INDEPENDENT_AMBULATORY_CARE_PROVIDER_SITE_OTHER): Payer: Medicare HMO

## 2020-04-14 ENCOUNTER — Other Ambulatory Visit: Payer: Self-pay

## 2020-04-14 DIAGNOSIS — Z23 Encounter for immunization: Secondary | ICD-10-CM | POA: Diagnosis not present

## 2020-04-21 DIAGNOSIS — R69 Illness, unspecified: Secondary | ICD-10-CM | POA: Diagnosis not present

## 2020-05-25 ENCOUNTER — Other Ambulatory Visit: Payer: Self-pay | Admitting: Physician Assistant

## 2020-05-25 DIAGNOSIS — F320 Major depressive disorder, single episode, mild: Secondary | ICD-10-CM

## 2020-05-25 NOTE — Progress Notes (Signed)
Subjective:   Meredith Scott is a 74 y.o. female who presents for Medicare Annual (Subsequent) preventive examination.  I connected with Gennie Alma today by telephone and verified that I am speaking with the correct person using two identifiers. Location patient: home Location provider: work Persons participating in the virtual visit: patient, provider.   I discussed the limitations, risks, security and privacy concerns of performing an evaluation and management service by telephone and the availability of in person appointments. I also discussed with the patient that there may be a patient responsible charge related to this service. The patient expressed understanding and verbally consented to this telephonic visit.    Interactive audio and video telecommunications were attempted between this provider and patient, however failed, due to patient having technical difficulties OR patient did not have access to video capability.  We continued and completed visit with audio only.   Review of Systems    N/A  Cardiac Risk Factors include: advanced age (>95men, >35 women);hypertension     Objective:    There were no vitals filed for this visit. There is no height or weight on file to calculate BMI.  Advanced Directives 05/26/2020 02/13/2020 05/19/2019 05/15/2018 09/05/2016 05/28/2015  Does Patient Have a Medical Advance Directive? Yes Yes Yes Yes Yes Yes  Type of Paramedic of [place];Living will [place];Living will [place];Living will [place];Living will Healthcare Power of [place];Living will  Copy of [place] in Chart? No - copy requested No - copy requested No - copy requested No - copy requested - -    Current Medications (verified) Outpatient Encounter Medications as of 05/26/2020  Medication Sig  . Ascorbic Acid (VITAMIN C PO) Take 500 mg by  mouth daily.   Marland Kitchen aspirin 81 MG tablet Take 81 mg by mouth daily.  . calcium-vitamin D (OSCAL 500/200 D-3) 500-200 MG-UNIT tablet Take 1 tablet by mouth daily with breakfast.   . COMBIVENT RESPIMAT 20-100 MCG/ACT AERS respimat USE 1 PUFF EVERY 4 HOURS AS NEEDED FOR WHEEZING  . escitalopram (LEXAPRO) 10 MG tablet TAKE 1/2 TABLET BY MOUTH DAILY  . estrogen-methylTESTOSTERone (EST ESTROGENS-METHYLTEST HS) 0.625-1.25 MG tablet Take 1 tablet by mouth as directed. TAKE 1 TABLET BY MOUTH EVERY 6 DAYS  . Fish Oil OIL by Does not apply route daily.   Marland Kitchen levothyroxine (SYNTHROID, LEVOTHROID) 25 MCG tablet Take 25 mcg by mouth daily before breakfast. 2 on Sunday and Thursday  . metroNIDAZOLE (METROCREAM) 0.75 % cream APP EXT TO FACE ONCE DAILY UTD  . Wheat Dextrin (BENEFIBER PO) Take by mouth daily. Or as EOD  . ascorbic acid (C 500/ROSE HIPS) 500 MG tablet Take by mouth. (Patient not taking: Reported on 05/26/2020)  . beclomethasone (BECONASE-AQ) 42 MCG/SPRAY nasal spray Place 1 spray into both nostrils 2 (two) times daily. As needed (seasonal) (Patient not taking: Reported on 05/26/2020)  . Fluticasone-Salmeterol (ADVAIR DISKUS) 250-50 MCG/DOSE AEPB Inhale 1 puff into the lungs in the morning and at bedtime. Advair diskus (Patient not taking: Reported on 05/26/2020)  . loratadine (CLARITIN) 10 MG tablet Take 1 tablet (10 mg total) by mouth daily as needed. (Patient not taking: Reported on 02/13/2020)  . Multiple Vitamin (MULTIVITAMIN) capsule Take 1 capsule by mouth daily. (Patient not taking: Reported on 02/13/2020)  . [DISCONTINUED] escitalopram (LEXAPRO) 10 MG tablet TAKE 1/2 TABLET BY MOUTH DAILY   No facility-administered encounter medications on file as of 05/26/2020.  Allergies (verified) Shellfish allergy, Sulfa antibiotics, and Neosporin [neomycin-bacitracin zn-polymyx]   History: Past Medical History:  Diagnosis Date  . Anxiety   . Asthma   . Asymptomatic varicose veins 2011  . Breast  screening, unspecified 2013  . Colon polyp   . Diffuse cystic mastopathy 2013  . Hernia of abdominal wall   . History of hiatal hernia   . Hypertension   . Hypothyroidism    Dr Eddie Dibbles  . Obesity, unspecified 2013  . PONV (postoperative nausea and vomiting)   . Screening for obesity 2013  . Skin cancer of face 2018   squamous/ left cheek  . Special screening for malignant neoplasms, colon 2013   Past Surgical History:  Procedure Laterality Date  . ABDOMINAL HYSTERECTOMY  1994  . BREAST CYST ASPIRATION Right 1997?   breast aspiration d/t infected milk gland  . BREAST MASS EXCISION Left 2011  . COLONOSCOPY  2005   done by Dr. Jamal Collin  . COLONOSCOPY N/A 01/27/2015   Procedure: COLONOSCOPY;  Surgeon: Christene Lye, MD;  Location: [facility] ENDOSCOPY;  Service: Endoscopy;  Laterality: N/A;  . COLONOSCOPY WITH PROPOFOL N/A 02/13/2020   Procedure: COLONOSCOPY WITH PROPOFOL;  Surgeon: Robert Bellow, MD;  Location: [facility] ENDOSCOPY;  Service: Endoscopy;  Laterality: N/A;  . DILATION AND CURETTAGE OF UTERUS  2007  . FINE NEEDLE ASPIRATION    . FOOT SURGERY Right   . LIPOMA EXCISION  2010   breast bone  . SKIN CANCER EXCISION Left 12/2016   under left eye, [facility]  . TONGUE BIOPSY     Family History  Problem Relation Age of Onset  . Liver cancer Mother   . Thyroid disease Mother   . Breast cancer Other   . Diabetes Father   . Healthy Sister   . Dementia Sister   . Healthy Brother   . Lung cancer Maternal Uncle   . Arthritis Granddaughter   . Arthritis Granddaughter    Social History   Socioeconomic History  . Marital status: Married    Spouse name: Not on file  . Number of children: 1  . Years of education: H/S  . Highest education level: High school graduate  Occupational History  . Occupation: Retired  Tobacco Use  . Smoking status: Never Smoker  . Smokeless tobacco: Never Used  Vaping Use  . Vaping Use: Never used  Substance and Sexual Activity  .  Alcohol use: No  . Drug use: No  . Sexual activity: Not Currently  Other Topics Concern  . Not on file  Social History Narrative  . Not on file   Social Determinants of Health   Financial Resource Strain: Low Risk   . Difficulty of Paying Living Expenses: Not hard at all  Food Insecurity: No Food Insecurity  . Worried About Charity fundraiser in the Last Year: Never true  . Ran Out of Food in the Last Year: Never true  Transportation Needs: No Transportation Needs  . Lack of Transportation (Medical): No  . Lack of Transportation (Non-Medical): No  Physical Activity: Insufficiently Active  . Days of Exercise per Week: 3 days  . Minutes of Exercise per Session: 30 min  Stress: No Stress Concern Present  . Feeling of Stress : Only a little  Social Connections: Socially Integrated  . Frequency of Communication with Friends and Family: More than three times a week  . Frequency of Social Gatherings with Friends and Family: More than three times a week  .  Attends Religious Services: More than 4 times per year  . Active Member of Clubs or Organizations: Yes  . Attends Archivist Meetings: More than 4 times per year  . Marital Status: Married    Tobacco Counseling Counseling given: Not Answered   Clinical Intake:  Pre-visit preparation completed: Yes        Nutritional Risks: None Diabetes: No  How often do you need to have someone help you when you read instructions, pamphlets, or other written materials from your doctor or pharmacy?: 1 - Never  Diabetic? No  Interpreter Needed?: No  Information entered by :: [facility], LPN   Activities of Daily Living In your present state of health, do you have any difficulty performing the following activities: 05/26/2020  Hearing? N  Vision? N  Difficulty concentrating or making decisions? N  Walking or climbing stairs? Y  Comment Due to knee pains.  Dressing or bathing? N  Doing errands, shopping? N  Preparing  Food and eating ? N  Using the Toilet? N  In the past six months, have you accidently leaked urine? Y  Comment Occasionally with urges.  Do you have problems with loss of bowel control? N  Managing your Medications? N  Managing your Finances? N  Housekeeping or managing your Housekeeping? N  Some recent data might be hidden    Patient Care Team: Mar Daring, PA-C as PCP - General (Family Medicine) Bary Castilla, Forest Gleason, MD (General Surgery) Warnell Forester, NP as Nurse Practitioner (Surgery) Hester Mates, OD as Referring Physician (Optometry) Pa, [facility] (Dermatology)  Indicate any recent Medical Services you may have received from other than [facility] providers in the past year (date may be approximate).     Assessment:   This is a routine wellness examination for Arie.  Hearing/Vision screen No exam data present  Dietary issues and exercise activities discussed: Current Exercise Habits: Home exercise routine, Type of exercise: walking, Time (Minutes): 30, Frequency (Times/Week): 3, Weekly Exercise (Minutes/Week): 90, Intensity: Mild, Exercise limited by: orthopedic condition(s)  Goals    . DIET - REDUCE PORTION SIZE     Continue TOPS and current diet plan of eating 3 small meals a day with two healthy snack in between.       Depression Screen PHQ 2/9 Scores 05/26/2020 06/06/2019 05/19/2019 05/15/2018 06/01/2017 09/05/2016 05/31/2016  PHQ - 2 Score 0 0 0 0 0 2 0  PHQ- 9 Score - 0 - - 0 3 -    Fall Risk Fall Risk  05/26/2020 05/19/2019 05/19/2019 01/02/2019 08/27/2018  Falls in the past year? 0 0 0 0 0  Comment - - - - -  Number falls in past yr: 0 0 0 - -  Injury with Fall? 0 0 0 - -  Follow up - - - - Falls evaluation completed    Any stairs in or around the home? No  If so, are there any without handrails? No  Home free of loose throw rugs in walkways, pet beds, electrical cords, etc? Yes  Adequate lighting in your home to reduce risk of falls? Yes     ASSISTIVE DEVICES UTILIZED TO PREVENT FALLS:  Life alert? No  Use of a cane, walker or w/c? No  Grab bars in the bathroom? No  Shower chair or bench in shower? Yes  Elevated toilet seat or a handicapped toilet? Yes    Cognitive Function:     6CIT Screen 05/26/2020 05/19/2019 05/15/2018  What Year? 0 points 0 points  0 points  What month? 0 points 0 points 0 points  What time? 0 points 0 points 0 points  Count back from 20 0 points 0 points 0 points  Months in reverse 0 points 0 points 0 points  Repeat phrase 0 points 0 points 2 points  Total Score 0 0 2    Immunizations Immunization History  Administered Date(s) Administered  . Fluad Quad(high Dose 65+) 04/08/2019, 04/14/2020  . Influenza, High Dose Seasonal PF 04/17/2014, 04/26/2015, 04/22/2016, 04/19/2017, 05/04/2018  . PFIZER SARS-COV-2 Vaccination 09/01/2019, 09/22/2019  . Pneumococcal Conjugate-13 05/22/2014  . Pneumococcal Polysaccharide-23 05/20/2012  . Td 03/14/2004  . Tdap 04/21/2011  . Zoster 12/02/2012  . Zoster Recombinat (Shingrix) 10/25/2017, 01/30/2018    TDAP status: Up to date Flu Vaccine status: Up to date Pneumococcal vaccine status: Up to date Covid-19 vaccine status: Completed vaccines  Qualifies for Shingles Vaccine? Yes   Zostavax completed Yes   Shingrix Completed?: Yes  Screening Tests Health Maintenance  Topic Date Due  . DEXA SCAN  06/13/2020  . TETANUS/TDAP  04/20/2021  . MAMMOGRAM  12/25/2021  . COLONOSCOPY  02/12/2025  . INFLUENZA VACCINE  Completed  . COVID-19 Vaccine  Completed  . Hepatitis C Screening  Completed  . PNA vac Low Risk Adult  Completed    Health Maintenance  There are no preventive care reminders to display for this patient.  Colorectal cancer screening: Completed 02/13/20. Repeat every 5 years Mammogram status: Completed 12/26/19. Repeat every year Bone Density status: Completed 06/14/15. Results reflect: Bone density results: OSTEOPENIA. Repeat every 5  years. Pt would like to wait until she sees PCP for DEXA order.  Lung Cancer Screening: (Low Dose CT Chest recommended if Age 69-80 years, 30 pack-year currently smoking OR have quit w/in 15years.) does not qualify.   Additional Screening:  Hepatitis C Screening: Up to date  Vision Screening: Recommended annual ophthalmology exams for early detection of glaucoma and other disorders of the eye. Is the patient up to date with their annual eye exam?  Yes  Who is the provider or what is the name of the office in which the patient attends annual eye exams? Dr Valma Cava If pt is not established with a provider, would they like to be referred to a provider to establish care? No .   Dental Screening: Recommended annual dental exams for proper oral hygiene  Community Resource Referral / Chronic Care Management: CRR required this visit?  No   CCM required this visit?  No      Plan:     I have personally reviewed and noted the following in the patient's chart:   . Medical and social history . Use of alcohol, tobacco or illicit drugs  . Current medications and supplements . Functional ability and status . Nutritional status . Physical activity . Advanced directives . List of other physicians . Hospitalizations, surgeries, and ER visits in previous 12 months . Vitals . Screenings to include cognitive, depression, and falls . Referrals and appointments  In addition, I have reviewed and discussed with patient certain preventive protocols, quality metrics, and best practice recommendations. A written personalized care plan for preventive services as well as general preventive health recommendations were provided to patient.     Giovan Pinsky [place], [place]   16/04/[id number]   Nurse Notes: Pt wanted to wait until she sees PCP for DEXA order.

## 2020-05-26 ENCOUNTER — Other Ambulatory Visit: Payer: Self-pay

## 2020-05-26 ENCOUNTER — Ambulatory Visit (INDEPENDENT_AMBULATORY_CARE_PROVIDER_SITE_OTHER): Payer: Medicare HMO

## 2020-05-26 DIAGNOSIS — Z0000 Encounter for general adult medical examination without abnormal findings: Secondary | ICD-10-CM | POA: Diagnosis not present

## 2020-05-26 NOTE — Patient Instructions (Signed)
Meredith Scott , Thank you for taking time to come for your Medicare Wellness Visit. I appreciate your ongoing commitment to your health goals. Please review the following plan we discussed and let me know if I can assist you in the future.   Screening recommendations/referrals: Colonoscopy: Up to date, due 01/2025 Mammogram: Up to date, due 12/2020 Bone Density: Up to date, due 05/2020 Recommended yearly ophthalmology/optometry visit for glaucoma screening and checkup Recommended yearly dental visit for hygiene and checkup  Vaccinations: Influenza vaccine: Done 04/14/20 Pneumococcal vaccine: Completed series Tdap vaccine: Up to date, due 04/2021 Shingles vaccine: Completed series   Advanced directives: Please bring a copy of your POA (Power of [place]) and/or Living Will to your next appointment.   Conditions/risks identified: Continue TOPS and current diet plan of eating 3 small meals a day with two healthy snack in between.   Next appointment: 06/03/20 @ 10:30 AM for a Covid booster    Preventive Care 74 Years and Older, Female Preventive care refers to lifestyle choices and visits with your health care provider that can promote health and wellness. What does preventive care include?  A yearly physical exam. This is also called an annual well check.  Dental exams once or twice a year.  Routine eye exams. Ask your health care provider how often you should have your eyes checked.  Personal lifestyle choices, including:  Daily care of your teeth and gums.  Regular physical activity.  Eating a healthy diet.  Avoiding tobacco and drug use.  Limiting alcohol use.  Practicing safe sex.  Taking low-dose aspirin every day.  Taking vitamin and mineral supplements as recommended by your health care provider. What happens during an annual well check? The services and screenings done by your health care provider during your annual well check will depend on your age, overall  health, lifestyle risk factors, and family history of disease. Counseling  Your health care provider may ask you questions about your:  Alcohol use.  Tobacco use.  Drug use.  Emotional well-being.  Home and relationship well-being.  Sexual activity.  Eating habits.  History of falls.  Memory and ability to understand (cognition).  Work and work Statistician.  Reproductive health. Screening  You may have the following tests or measurements:  Height, weight, and BMI.  Blood pressure.  Lipid and cholesterol levels. These may be checked every 5 years, or more frequently if you are over 74 years old.  Skin check.  Lung cancer screening. You may have this screening every year starting at age 65 if you have a 30-pack-year history of smoking and currently smoke or have quit within the past 15 years.  Fecal occult blood test (FOBT) of the stool. You may have this test every year starting at age 74.  Flexible sigmoidoscopy or colonoscopy. You may have a sigmoidoscopy every 5 years or a colonoscopy every 10 years starting at age 74.  Hepatitis C blood test.  Hepatitis B blood test.  Sexually transmitted disease (STD) testing.  Diabetes screening. This is done by checking your blood sugar (glucose) after you have not eaten for a while (fasting). You may have this done every 1-3 years.  Bone density scan. This is done to screen for osteoporosis. You may have this done starting at age 74.  Mammogram. This may be done every 1-2 years. Talk to your health care provider about how often you should have regular mammograms. Talk with your health care provider about your test results, treatment options, and  if necessary, the need for more tests. Vaccines  Your health care provider may recommend certain vaccines, such as:  Influenza vaccine. This is recommended every year.  Tetanus, diphtheria, and acellular pertussis (Tdap, Td) vaccine. You may need a Td booster every 10  years.  Zoster vaccine. You may need this after age 74.  Pneumococcal 13-valent conjugate (PCV13) vaccine. One dose is recommended after age 74.  Pneumococcal polysaccharide (PPSV23) vaccine. One dose is recommended after age 74. Talk to your health care provider about which screenings and vaccines you need and how often you need them. This information is not intended to replace advice given to you by your health care provider. Make sure you discuss any questions you have with your health care provider. Document Released: 07/30/2015 Document Revised: 03/22/2016 Document Reviewed: 05/04/2015 Elsevier Interactive Patient Education  2017 [place] Prevention in the Home Falls can cause injuries. They can happen to people of all ages. There are many things you can do to make your home safe and to help prevent falls. What can I do on the outside of my home?  Regularly fix the edges of walkways and driveways and fix any cracks.  Remove anything that might make you trip as you walk through a door, such as a raised step or threshold.  Trim any bushes or trees on the path to your home.  Use bright outdoor lighting.  Clear any walking paths of anything that might make someone trip, such as rocks or tools.  Regularly check to see if handrails are loose or broken. Make sure that both sides of any steps have handrails.  Any raised decks and porches should have guardrails on the edges.  Have any leaves, snow, or ice cleared regularly.  Use sand or salt on walking paths during winter.  Clean up any spills in your garage right away. This includes oil or grease spills. What can I do in the bathroom?  Use night lights.  Install grab bars by the toilet and in the tub and shower. Do not use towel bars as grab bars.  Use non-skid mats or decals in the tub or shower.  If you need to sit down in the shower, use a plastic, non-slip stool.  Keep the floor dry. Clean up any water that  spills on the floor as soon as it happens.  Remove soap buildup in the tub or shower regularly.  Attach bath mats securely with double-sided non-slip rug tape.  Do not have throw rugs and other things on the floor that can make you trip. What can I do in the bedroom?  Use night lights.  Make sure that you have a light by your bed that is easy to reach.  Do not use any sheets or blankets that are too big for your bed. They should not hang down onto the floor.  Have a firm chair that has side arms. You can use this for support while you get dressed.  Do not have throw rugs and other things on the floor that can make you trip. What can I do in the kitchen?  Clean up any spills right away.  Avoid walking on wet floors.  Keep items that you use a lot in easy-to-reach places.  If you need to reach something above you, use a strong step stool that has a grab bar.  Keep electrical cords out of the way.  Do not use floor polish or wax that makes floors slippery. If you  must use wax, use non-skid floor wax.  Do not have throw rugs and other things on the floor that can make you trip. What can I do with my stairs?  Do not leave any items on the stairs.  Make sure that there are handrails on both sides of the stairs and use them. Fix handrails that are broken or loose. Make sure that handrails are as long as the stairways.  Check any carpeting to make sure that it is firmly attached to the stairs. Fix any carpet that is loose or worn.  Avoid having throw rugs at the top or bottom of the stairs. If you do have throw rugs, attach them to the floor with carpet tape.  Make sure that you have a light switch at the top of the stairs and the bottom of the stairs. If you do not have them, ask someone to add them for you. What else can I do to help prevent falls?  Wear shoes that:  Do not have high heels.  Have rubber bottoms.  Are comfortable and fit you well.  Are closed at the  toe. Do not wear sandals.  If you use a stepladder:  Make sure that it is fully opened. Do not climb a closed stepladder.  Make sure that both sides of the stepladder are locked into place.  Ask someone to hold it for you, if possible.  Clearly mark and make sure that you can see:  Any grab bars or handrails.  First and last steps.  Where the edge of each step is.  Use tools that help you move around (mobility aids) if they are needed. These include:  Canes.  Walkers.  Scooters.  Crutches.  Turn on the lights when you go into a dark area. Replace any light bulbs as soon as they burn out.  Set up your furniture so you have a clear path. Avoid moving your furniture around.  If any of your floors are uneven, fix them.  If there are any pets around you, be aware of where they are.  Review your medicines with your doctor. Some medicines can make you feel dizzy. This can increase your chance of falling. Ask your doctor what other things that you can do to help prevent falls. This information is not intended to replace advice given to you by your health care provider. Make sure you discuss any questions you have with your health care provider. Document Released: 04/29/2009 Document Revised: 12/09/2015 Document Reviewed: 08/07/2014 Elsevier Interactive Patient Education  2017 [employer].

## 2020-06-03 ENCOUNTER — Other Ambulatory Visit: Payer: Self-pay

## 2020-06-03 ENCOUNTER — Ambulatory Visit (INDEPENDENT_AMBULATORY_CARE_PROVIDER_SITE_OTHER): Payer: Medicare HMO

## 2020-06-03 DIAGNOSIS — Z23 Encounter for immunization: Secondary | ICD-10-CM

## 2020-06-05 ENCOUNTER — Other Ambulatory Visit: Payer: Self-pay | Admitting: Physician Assistant

## 2020-06-05 DIAGNOSIS — N951 Menopausal and female climacteric states: Secondary | ICD-10-CM

## 2020-06-05 NOTE — Telephone Encounter (Signed)
Requested medication (s) are due for refill today: yes  Requested medication (s) are on the active medication list: yes   Last refill:  04/09/2020  Future visit scheduled: Yes  Notes to clinic:  this refill cannot be delegated    Requested Prescriptions  Pending Prescriptions Disp Refills   EST ESTROGENS-METHYLTEST HS 0.625-1.25 MG tablet [Pharmacy Med Name: EST ESTRGN METHTEST 0.625/1.25MG  TB] 30 tablet     Sig: TAKE 1 TABLET BY MOUTH EVERY 6 DAYS      Not Delegated - OB/GYN:  Hormone Combinations - Controlled Failed - 06/05/2020  3:27 PM      Failed - This refill cannot be delegated      Failed - Valid encounter within last 12 months    Recent Outpatient Visits           1 year ago Encounter for annual physical exam   [facility], [place], [place]   2 years ago Annual physical exam   [facility] [place], [place], [place]   2 years ago Possible urinary tract infection   [facility], [place], [place]   2 years ago Acute pansinusitis, recurrence not specified   [facility] [place], [place], [place]   2 years ago Upper respiratory tract infection, unspecified type   [facility], [place], [place]              [place] is up-to-date per Health Maintenance      Passed - Last BP in normal range    BP Readings from Last 1 Encounters:  02/13/20 124/69

## 2020-06-07 ENCOUNTER — Other Ambulatory Visit: Payer: Self-pay | Admitting: Physician Assistant

## 2020-06-07 DIAGNOSIS — N951 Menopausal and female climacteric states: Secondary | ICD-10-CM

## 2020-06-07 MED ORDER — EST ESTROGENS-METHYLTEST 0.625-1.25 MG PO TABS: ORAL_TABLET | ORAL | 5 refills | Status: DC

## 2020-06-07 NOTE — Telephone Encounter (Signed)
Copied from [place] [id number]. Topic: Quick Communication - Rx Refill/Question >> Jun 07, 2020  9:15 AM Yvette Rack wrote: Rx shows that it was printed. Pt requests that Rx be sent to her pharmacy Medication: EST ESTROGENS-METHYLTEST HS 0.625-1.25 MG tablet  Has the patient contacted their pharmacy? yes  Preferred Pharmacy (with phone number or street name): [id number] [address], [place] Phone: [phone]  Fax: [phone]  Agent: Please be advised that RX refills may take up to 3 business days. We ask that you follow-up with your pharmacy.

## 2020-06-07 NOTE — Telephone Encounter (Signed)
}    Notes to clinic:  Please resend script as it shows that it was printed    Requested Prescriptions  Pending Prescriptions Disp Refills   estrogen-methylTESTOSTERone (EST ESTROGENS-METHYLTEST HS) 0.625-1.25 MG tablet 30 tablet 0    Sig: TAKE 1 TABLET BY MOUTH EVERY 6 DAYS      Not Delegated - OB/GYN:  Hormone Combinations - Controlled Failed - 06/07/2020  9:29 AM      Failed - This refill cannot be delegated      Failed - Valid encounter within last 12 months    Recent Outpatient Visits           1 year ago Encounter for annual physical exam   [facility] [place], [place], [place]   2 years ago Annual physical exam   [place], [place], [place]   2 years ago Possible urinary tract infection   [facility] [place], [place], [place]   2 years ago Acute pansinusitis, recurrence not specified   [place], [place], [place]   2 years ago Upper respiratory tract infection, unspecified type   [facility], [place], [place]              [place] is up-to-date per Health Maintenance      Passed - Last BP in normal range    BP Readings from Last 1 Encounters:  02/13/20 124/69

## 2020-06-14 ENCOUNTER — Ambulatory Visit (INDEPENDENT_AMBULATORY_CARE_PROVIDER_SITE_OTHER): Payer: Medicare HMO | Admitting: Physician Assistant

## 2020-06-14 ENCOUNTER — Encounter: Payer: Self-pay | Admitting: Physician Assistant

## 2020-06-14 ENCOUNTER — Other Ambulatory Visit: Payer: Self-pay

## 2020-06-14 VITALS — BP 138/57 | HR 66 | Temp 98.0°F | Resp 16 | Ht 63.0 in | Wt 184.2 lb

## 2020-06-14 DIAGNOSIS — Z833 Family history of diabetes mellitus: Secondary | ICD-10-CM | POA: Diagnosis not present

## 2020-06-14 DIAGNOSIS — M2550 Pain in unspecified joint: Secondary | ICD-10-CM

## 2020-06-14 DIAGNOSIS — Z6832 Body mass index (BMI) 32.0-32.9, adult: Secondary | ICD-10-CM | POA: Diagnosis not present

## 2020-06-14 DIAGNOSIS — F320 Major depressive disorder, single episode, mild: Secondary | ICD-10-CM

## 2020-06-14 DIAGNOSIS — E039 Hypothyroidism, unspecified: Secondary | ICD-10-CM

## 2020-06-14 DIAGNOSIS — Z780 Asymptomatic menopausal state: Secondary | ICD-10-CM

## 2020-06-14 DIAGNOSIS — N6019 Diffuse cystic mastopathy of unspecified breast: Secondary | ICD-10-CM

## 2020-06-14 DIAGNOSIS — Z0000 Encounter for general adult medical examination without abnormal findings: Secondary | ICD-10-CM

## 2020-06-14 DIAGNOSIS — Z8261 Family history of arthritis: Secondary | ICD-10-CM

## 2020-06-14 DIAGNOSIS — Z8269 Family history of other diseases of the musculoskeletal system and connective tissue: Secondary | ICD-10-CM | POA: Insufficient documentation

## 2020-06-14 DIAGNOSIS — M8589 Other specified disorders of bone density and structure, multiple sites: Secondary | ICD-10-CM | POA: Diagnosis not present

## 2020-06-14 DIAGNOSIS — E6609 Other obesity due to excess calories: Secondary | ICD-10-CM

## 2020-06-14 DIAGNOSIS — R311 Benign essential microscopic hematuria: Secondary | ICD-10-CM

## 2020-06-14 DIAGNOSIS — M8580 Other specified disorders of bone density and structure, unspecified site: Secondary | ICD-10-CM | POA: Diagnosis not present

## 2020-06-14 DIAGNOSIS — R635 Abnormal weight gain: Secondary | ICD-10-CM | POA: Diagnosis not present

## 2020-06-14 DIAGNOSIS — R69 Illness, unspecified: Secondary | ICD-10-CM | POA: Diagnosis not present

## 2020-06-14 LAB — POCT URINALYSIS DIPSTICK
Bilirubin, UA: NEGATIVE
Glucose, UA: NEGATIVE
Ketones, UA: NEGATIVE
Leukocytes, UA: NEGATIVE
Nitrite, UA: NEGATIVE
Protein, UA: NEGATIVE
Spec Grav, UA: 1.015 (ref 1.010–1.025)
Urobilinogen, UA: 0.2 E.U./dL
pH, UA: 7.5 (ref 5.0–8.0)

## 2020-06-14 MED ORDER — ESCITALOPRAM OXALATE 10 MG PO TABS: 5.0000 mg | ORAL_TABLET | Freq: Every day | ORAL | 3 refills | Status: DC

## 2020-06-14 MED ORDER — BUDESONIDE-FORMOTEROL FUMARATE 160-4.5 MCG/ACT IN AERO: 2.0000 | INHALATION_SPRAY | Freq: Two times a day (BID) | RESPIRATORY_TRACT | 3 refills | Status: DC

## 2020-06-14 MED ORDER — BUDESONIDE-FORMOTEROL FUMARATE 160-4.5 MCG/ACT IN AERO
2.0000 | INHALATION_SPRAY | Freq: Two times a day (BID) | RESPIRATORY_TRACT | 3 refills | Status: DC
Start: 2020-06-14 — End: 2020-06-14

## 2020-06-14 NOTE — Patient Instructions (Signed)
Preventive Care 74 Years and Older, Female Preventive care refers to lifestyle choices and visits with your health care provider that can promote health and wellness. This includes:  A yearly physical exam. This is also called an annual well check.  Regular dental and eye exams.  Immunizations.  Screening for certain conditions.  Healthy lifestyle choices, such as diet and exercise. What can I expect for my preventive care visit? Physical exam Your health care provider will check:  Height and weight. These may be used to calculate body mass index (BMI), which is a measurement that tells if you are at a healthy weight.  Heart rate and blood pressure.  Your skin for abnormal spots. Counseling Your health care provider may ask you questions about:  Alcohol, tobacco, and drug use.  Emotional well-being.  Home and relationship well-being.  Sexual activity.  Eating habits.  History of falls.  Memory and ability to understand (cognition).  Work and work Statistician.  Pregnancy and menstrual history. What immunizations do I need?  Influenza (flu) vaccine  This is recommended every year. Tetanus, diphtheria, and pertussis (Tdap) vaccine  You may need a Td booster every 10 years. Varicella (chickenpox) vaccine  You may need this vaccine if you have not already been vaccinated. Zoster (shingles) vaccine  You may need this after age 39. Pneumococcal conjugate (PCV13) vaccine  One dose is recommended after age 73. Pneumococcal polysaccharide (PPSV23) vaccine  One dose is recommended after age 13. Measles, mumps, and rubella (MMR) vaccine  You may need at least one dose of MMR if you were born in 1957 or later. You may also need a second dose. Meningococcal conjugate (MenACWY) vaccine  You may need this if you have certain conditions. Hepatitis A vaccine  You may need this if you have certain conditions or if you travel or work in places where you may be exposed  to hepatitis A. Hepatitis B vaccine  You may need this if you have certain conditions or if you travel or work in places where you may be exposed to hepatitis B. Haemophilus influenzae type b (Hib) vaccine  You may need this if you have certain conditions. You may receive vaccines as individual doses or as more than one vaccine together in one shot (combination vaccines). Talk with your health care provider about the risks and benefits of combination vaccines. What tests do I need? Blood tests  Lipid and cholesterol levels. These may be checked every 5 years, or more frequently depending on your overall health.  Hepatitis C test.  Hepatitis B test. Screening  Lung cancer screening. You may have this screening every year starting at age 69 if you have a 30-pack-year history of smoking and currently smoke or have quit within the past 15 years.  Colorectal cancer screening. All adults should have this screening starting at age 71 and continuing until age 4. Your health care provider may recommend screening at age 64 if you are at increased risk. You will have tests every 1-10 years, depending on your results and the type of screening test.  Diabetes screening. This is done by checking your blood sugar (glucose) after you have not eaten for a while (fasting). You may have this done every 1-3 years.  Mammogram. This may be done every 1-2 years. Talk with your health care provider about how often you should have regular mammograms.  BRCA-related cancer screening. This may be done if you have a family history of breast, ovarian, tubal, or peritoneal cancers.  Other tests °· Sexually transmitted disease (STD) testing. °· Bone density scan. This is done to screen for osteoporosis. You may have this done starting at age 65. °Follow these instructions at home: °Eating and drinking °· Eat a diet that includes fresh fruits and vegetables, whole grains, lean protein, and low-fat dairy products. Limit  your intake of foods with high amounts of sugar, saturated fats, and salt. °· Take vitamin and mineral supplements as recommended by your health care provider. °· Do not drink alcohol if your health care provider tells you not to drink. °· If you drink alcohol: °? Limit how much you have to 0-1 drink a day. °? Be aware of how much alcohol is in your drink. In the [place] one drink equals one 12 oz bottle of beer (355 mL), one 5 oz glass of wine (148 mL), or one 1½ oz glass of hard liquor (44 mL). °Lifestyle °· Take daily care of your teeth and gums. °· Stay active. Exercise for at least 30 minutes on 5 or more days each week. °· Do not use any products that contain nicotine or tobacco, such as cigarettes, e-cigarettes, and chewing tobacco. If you need help quitting, ask your health care provider. °· If you are sexually active, practice safe sex. Use a condom or other form of protection in order to prevent STIs (sexually transmitted infections). °· Talk with your health care provider about taking a low-dose aspirin or statin. °What's next? °· Go to your health care provider once a year for a well check visit. °· Ask your health care provider how often you should have your eyes and teeth checked. °· Stay up to date on all vaccines. °This information is not intended to replace advice given to you by your health care provider. Make sure you discuss any questions you have with your health care provider. °Document Revised: 06/27/2018 Document Reviewed: 06/27/2018 °Elsevier Patient Education © 2020 Else[employer] Inc. ° °

## 2020-06-14 NOTE — Progress Notes (Signed)
Complete physical exam   Patient: Meredith Scott   DOB: Jan 24, 1946   74 y.o. Female  MRN: [id number] Visit Date: 06/14/2020  Today's healthcare provider: Mar Daring, PA-C   No chief complaint on file.  Subjective    Meredith Scott is a 74 y.o. female who presents today for a complete physical exam.  She reports consuming a healthy diet. The patient does not participate in regular exercise at present. She generally feels well. She reports sleeping well. She does have additional problems to discuss today. She has been feeling more stiff maybe due to age. Lump, R hand. HPI  Had AWV with [facility] 05/26/20  She has been noticing increased stiffness and pain in the hands, knees and hips. Has one granddaughter that has recently been diagnosed with a rare autoimmune arthritis, patient could not remember the name. She also had another granddaughter that was diagnosed with Lupus. Granddaughters are twins.   She also has a "lump" in the right palmar surface located over the area of the A1 pulley of the 4th finger. Denies locking or pain. Does have h/o a precancerous cyst in her foot so she is concerned of this area.   Past Medical History:  Diagnosis Date  . Anxiety   . Asthma   . Asymptomatic varicose veins 2011  . Breast screening, unspecified 2013  . Colon polyp   . Diffuse cystic mastopathy 2013  . Hernia of abdominal wall   . History of hiatal hernia   . Hypertension   . Hypothyroidism    Dr Eddie Dibbles  . Obesity, unspecified 2013  . PONV (postoperative nausea and vomiting)   . Screening for obesity 2013  . Skin cancer of face 2018   squamous/ left cheek  . Special screening for malignant neoplasms, colon 2013   Past Surgical History:  Procedure Laterality Date  . ABDOMINAL HYSTERECTOMY  1994  . BREAST CYST ASPIRATION Right 1997?   breast aspiration d/t infected milk gland  . BREAST MASS EXCISION Left 2011  . COLONOSCOPY  2005   done by Dr. Jamal Collin  . COLONOSCOPY  N/A 01/27/2015   Procedure: COLONOSCOPY;  Surgeon: Christene Lye, MD;  Location: [facility] ENDOSCOPY;  Service: Endoscopy;  Laterality: N/A;  . COLONOSCOPY WITH PROPOFOL N/A 02/13/2020   Procedure: COLONOSCOPY WITH PROPOFOL;  Surgeon: Robert Bellow, MD;  Location: [facility] ENDOSCOPY;  Service: Endoscopy;  Laterality: N/A;  . DILATION AND CURETTAGE OF UTERUS  2007  . FINE NEEDLE ASPIRATION    . FOOT SURGERY Right   . LIPOMA EXCISION  2010   breast bone  . SKIN CANCER EXCISION Left 12/2016   under left eye, [facility]  . TONGUE BIOPSY     Social History   Socioeconomic History  . Marital status: Married    Spouse name: Not on file  . Number of children: 1  . Years of education: H/S  . Highest education level: High school graduate  Occupational History  . Occupation: Retired  Tobacco Use  . Smoking status: Never Smoker  . Smokeless tobacco: Never Used  Vaping Use  . Vaping Use: Never used  Substance and Sexual Activity  . Alcohol use: No  . Drug use: No  . Sexual activity: Not Currently  Other Topics Concern  . Not on file  Social History Narrative  . Not on file   Social Determinants of Health   Financial Resource Strain: Low Risk   . Difficulty of Paying Living Expenses: Not  hard at all  Food Insecurity: No Food Insecurity  . Worried About Charity fundraiser in the Last Year: Never true  . Ran Out of Food in the Last Year: Never true  Transportation Needs: No Transportation Needs  . Lack of Transportation (Medical): No  . Lack of Transportation (Non-Medical): No  Physical Activity: Insufficiently Active  . Days of Exercise per Week: 3 days  . Minutes of Exercise per Session: 30 min  Stress: No Stress Concern Present  . Feeling of Stress : Only a little  Social Connections: Socially Integrated  . Frequency of Communication with Friends and Family: More than three times a week  . Frequency of Social Gatherings with Friends and Family: More than three  times a week  . Attends Religious Services: More than 4 times per year  . Active Member of Clubs or Organizations: Yes  . Attends Archivist Meetings: More than 4 times per year  . Marital Status: Married  Human resources officer Violence: Not At Risk  . Fear of Current or Ex-Partner: No  . Emotionally Abused: No  . Physically Abused: No  . Sexually Abused: No   Family Status  Relation Name Status  . Mother  Deceased at age 38  . Other Paternal Cousin Alive  . Father  Deceased  . Sister  Alive  . Brother  Alive  . Mat Uncle  (Not Specified)  . Daughter  Alive       Had a kidney aneursym 8/20  . G Daughter  Alive  . G Daughter  Alive   Family History  Problem Relation Age of Onset  . Liver cancer Mother   . Thyroid disease Mother   . Breast cancer Other   . Diabetes Father   . Healthy Sister   . Dementia Sister   . Healthy Brother   . Lung cancer Maternal Uncle   . Arthritis Granddaughter   . Arthritis Granddaughter    Allergies  Allergen Reactions  . Shellfish Allergy Other (See Comments)    Throat closes  . Sulfa Antibiotics Swelling  . Neosporin [Neomycin-Bacitracin Zn-Polymyx] Rash    Patient Care Team: Mar Daring, PA-C as PCP - General (Family Medicine) Bary Castilla, Forest Gleason, MD (General Surgery) Warnell Forester, NP as Nurse Practitioner (Surgery) Hester Mates, OD as Referring Physician (Optometry) Pa, [facility] (Dermatology)   Medications: Outpatient Medications Prior to Visit  Medication Sig  . ascorbic acid (C 500/ROSE HIPS) 500 MG tablet Take by mouth.   . Ascorbic Acid (VITAMIN C PO) Take 500 mg by mouth daily.   Marland Kitchen aspirin 81 MG tablet Take 81 mg by mouth daily.  . beclomethasone (BECONASE-AQ) 42 MCG/SPRAY nasal spray Place 1 spray into both nostrils 2 (two) times daily. As needed (seasonal)   . calcium-vitamin D (OSCAL 500/200 D-3) 500-200 MG-UNIT tablet Take 1 tablet by mouth daily with breakfast.   . COMBIVENT RESPIMAT  20-100 MCG/ACT AERS respimat USE 1 PUFF EVERY 4 HOURS AS NEEDED FOR WHEEZING  . estrogen-methylTESTOSTERone (EST ESTROGENS-METHYLTEST HS) 0.625-1.25 MG tablet TAKE 1 TABLET BY MOUTH EVERY 6 DAYS  . Fish Oil OIL by Does not apply route daily.   Marland Kitchen levothyroxine (SYNTHROID, LEVOTHROID) 25 MCG tablet Take 25 mcg by mouth daily before breakfast. 2 on Sunday and Thursday  . loratadine (CLARITIN) 10 MG tablet Take 1 tablet (10 mg total) by mouth daily as needed.  . metroNIDAZOLE (METROCREAM) 0.75 % cream APP EXT TO FACE ONCE DAILY UTD  . Multiple  Vitamin (MULTIVITAMIN) capsule Take 1 capsule by mouth daily.   . Wheat Dextrin (BENEFIBER PO) Take by mouth daily. Or as EOD  . [DISCONTINUED] escitalopram (LEXAPRO) 10 MG tablet TAKE 1/2 TABLET BY MOUTH DAILY  . [DISCONTINUED] Fluticasone-Salmeterol (ADVAIR DISKUS) 250-50 MCG/DOSE AEPB Inhale 1 puff into the lungs in the morning and at bedtime. Advair diskus (Patient not taking: Reported on 05/26/2020)   No facility-administered medications prior to visit.    Review of Systems  Constitutional: Negative.   HENT: Negative.   Eyes: Negative.   Respiratory: Positive for cough ("seasonal").   Cardiovascular: Negative.   Gastrointestinal: Negative.   Endocrine: Negative.   Genitourinary: Negative.   Musculoskeletal: Positive for arthralgias.  Skin: Negative.   Allergic/Immunologic: Negative.   Neurological: Negative.   Hematological: Negative.   Psychiatric/Behavioral: Negative.        Objective    BP (!) 138/57 (BP Location: Left Arm, Patient Position: Sitting, Cuff Size: Large)   Pulse 66   Temp 98 F (36.7 C) (Oral)   Resp 16   Ht 5\' 3"  (1.6 m)   Wt 184 lb 3.2 oz (83.6 kg)   BMI 32.63 kg/m     Physical Exam Vitals reviewed.  Constitutional:      General: She is not in acute distress.    Appearance: Normal appearance. She is well-developed and well-groomed. She is obese. She is not ill-appearing or diaphoretic.  HENT:     Head:  Normocephalic and atraumatic.     Right Ear: Tympanic membrane, ear canal and external ear normal.     Left Ear: Tympanic membrane, ear canal and external ear normal.  Eyes:     General: No scleral icterus.       Right eye: No discharge.        Left eye: No discharge.     Extraocular Movements: Extraocular movements intact.     Conjunctiva/sclera: Conjunctivae normal.     Pupils: Pupils are equal, round, and reactive to light.  Neck:     Thyroid: No thyromegaly.     Vascular: No carotid bruit or JVD.     Trachea: No tracheal deviation.  Cardiovascular:     Rate and Rhythm: Normal rate and regular rhythm.     Pulses: Normal pulses.     Heart sounds: Normal heart sounds. No murmur heard.  No friction rub. No gallop.   Pulmonary:     Effort: Pulmonary effort is normal. No respiratory distress.     Breath sounds: Normal breath sounds. No wheezing or rales.  Chest:     Chest wall: No tenderness.     Breasts:        Right: Normal.        Left: Normal.  Abdominal:     General: Abdomen is flat. Bowel sounds are normal. There is no distension.     Palpations: Abdomen is soft. There is no mass.     Tenderness: There is no abdominal tenderness. There is no guarding or rebound.  Musculoskeletal:        General: No tenderness. Normal range of motion.       Arms:     Cervical back: Normal range of motion and neck supple. No tenderness.     Right lower leg: No edema.     Left lower leg: No edema.  Lymphadenopathy:     Cervical: No cervical adenopathy.  Skin:    General: Skin is warm and dry.     Capillary Refill: Capillary refill takes  less than 2 seconds.     Findings: No rash.     Comments: Varicosities noted in bilateral lower extremities  Neurological:     General: No focal deficit present.     Mental Status: She is alert and oriented to person, place, and time. Mental status is at baseline.  Psychiatric:        Mood and Affect: Mood normal.        Behavior: Behavior normal.  Behavior is cooperative.        Thought Content: Thought content normal.        Judgment: Judgment normal.      Last depression screening scores PHQ 2/9 Scores 05/26/2020 06/06/2019 05/19/2019  PHQ - 2 Score 0 0 0  PHQ- 9 Score - 0 -   Last fall risk screening Fall Risk  05/26/2020  Falls in the past year? 0  Comment -  Number falls in past yr: 0  Injury with Fall? 0  Follow up -   Last Audit-C alcohol use screening Alcohol Use Disorder Test (AUDIT) 05/26/2020  1. How often do you have a drink containing alcohol? 0  2. How many drinks containing alcohol do you have on a typical day when you are drinking? 0  3. How often do you have six or more drinks on one occasion? 0  AUDIT-C Score 0  Alcohol Brief Interventions/Follow-up AUDIT Score <7 follow-up not indicated   A score of 3 or more in women, and 4 or more in men indicates increased risk for alcohol abuse, EXCEPT if all of the points are from question 1    Results for orders placed or performed in visit on 06/14/20  CBC w/Diff/Platelet  Result Value Ref Range   WBC 6.5 3.4 - 10.8 x10E3/uL   RBC 4.46 3.77 - 5.28 x10E6/uL   Hemoglobin 14.2 11.1 - 15.9 g/dL   Hematocrit 41.3 34.0 - 46.6 %   MCV 93 79 - 97 fL   MCH 31.8 26.6 - 33.0 pg   MCHC 34.4 31 - 35 g/dL   RDW 11.9 11.7 - 15.4 %   Platelets 273 150 - 450 x10E3/uL   Neutrophils 62 Not Estab. %   Lymphs 25 Not Estab. %   Monocytes 10 Not Estab. %   Eos 2 Not Estab. %   Basos 1 Not Estab. %   Neutrophils Absolute 4.0 1.40 - 7.00 x10E3/uL   Lymphocytes Absolute 1.6 0 - 3 x10E3/uL   Monocytes Absolute 0.6 0 - 0 x10E3/uL   EOS (ABSOLUTE) 0.2 0.0 - 0.4 x10E3/uL   Basophils Absolute 0.0 0 - 0 x10E3/uL   Immature Granulocytes 0 Not Estab. %   Immature Grans (Abs) 0.0 0.0 - 0.1 x10E3/uL  Comprehensive Metabolic Panel (CMET)  Result Value Ref Range   Glucose 102 (H) 65 - 99 mg/dL   BUN 12 8 - 27 mg/dL   Creatinine, Ser 0.89 0.57 - 1.00 mg/dL   GFR calc non Af Amer 65  >59 mL/min/1.73   GFR calc Af Amer 74 >59 mL/min/1.73   BUN/Creatinine Ratio 13 12 - 28   Sodium 142 134 - 144 mmol/L   Potassium 4.7 3.5 - 5.2 mmol/L   Chloride 103 96 - 106 mmol/L   CO2 24 20 - 29 mmol/L   Calcium 9.5 8.7 - 10.3 mg/dL   Total Protein 6.6 6.0 - 8.5 g/dL   Albumin 4.4 3.7 - 4.7 g/dL   Globulin, Total 2.2 1.5 - 4.5 g/dL   Albumin/Globulin Ratio  2.0 1.2 - 2.2   Bilirubin Total 0.5 0.0 - 1.2 mg/dL   Alkaline Phosphatase 77 44 - 121 IU/L   AST 20 0 - 40 IU/L   ALT 16 0 - 32 IU/L  Lipid Panel With LDL/HDL Ratio  Result Value Ref Range   Cholesterol, Total 212 (H) 100 - 199 mg/dL   Triglycerides 74 0 - 149 mg/dL   HDL 54 >39 mg/dL   VLDL Cholesterol Cal 13 5 - 40 mg/dL   LDL Chol Calc (NIH) 145 (H) 0 - 99 mg/dL   LDL/HDL Ratio 2.7 0.0 - 3.2 ratio  HgB A1c  Result Value Ref Range   Hgb A1c MFr Bld 5.6 4.8 - 5.6 %   Est. average glucose Bld gHb Est-mCnc 114 mg/dL  Vitamin D (25 hydroxy)  Result Value Ref Range   Vit D, 25-Hydroxy 21.1 (L) 30.0 - 100.0 ng/mL  ANA,IFA RA Diag Pnl w/rflx Tit/Patn  Result Value Ref Range   ANA Titer 1 WILL FOLLOW    Rhuematoid fact SerPl-aCnc <10.0 <93.7 IU/mL   Cyclic Citrullin Peptide Ab WILL FOLLOW   TSH + free T4  Result Value Ref Range   TSH 1.200 0.450 - 4.500 uIU/mL   Free T4 1.34 0.82 - 1.77 ng/dL  POCT Urinalysis Dipstick  Result Value Ref Range   Color, UA light yellow    Clarity, UA clear    Glucose, UA Negative Negative   Bilirubin, UA Negative    Ketones, UA Negative    Spec Grav, UA 1.015 1.010 - 1.025   Blood, UA NH Trace    pH, UA 7.5 5.0 - 8.0   Protein, UA Negative Negative   Urobilinogen, UA 0.2 0.2 or 1.0 E.U./dL   Nitrite, UA Negative    Leukocytes, UA Negative Negative   Appearance     Odor      Assessment & Plan    Routine Health Maintenance and Physical Exam  Exercise Activities and Dietary recommendations Goals    . DIET - REDUCE PORTION SIZE     Continue TOPS and current diet plan of  eating 3 small meals a day with two healthy snack in between.        Immunization History  Administered Date(s) Administered  . Fluad Quad(high Dose 65+) 04/08/2019, 04/14/2020  . Influenza, High Dose Seasonal PF 04/17/2014, 04/26/2015, 04/22/2016, 04/19/2017, 05/04/2018  . PFIZER SARS-COV-2 Vaccination 09/01/2019, 09/22/2019, 06/03/2020  . Pneumococcal Conjugate-13 05/22/2014  . Pneumococcal Polysaccharide-23 05/20/2012  . Td 03/14/2004  . Tdap 04/21/2011  . Zoster 12/02/2012  . Zoster Recombinat (Shingrix) 10/25/2017, 01/30/2018    Health Maintenance  Topic Date Due  . DEXA SCAN  06/13/2020  . MAMMOGRAM  12/25/2020  . TETANUS/TDAP  04/20/2021  . COLONOSCOPY  02/12/2025  . INFLUENZA VACCINE  Completed  . COVID-19 Vaccine  Completed  . Hepatitis C Screening  Completed  . PNA vac Low Risk Adult  Completed    Discussed health benefits of physical activity, and encouraged her to engage in regular exercise appropriate for her age and condition.  1. Annual physical exam Normal physical exam today. Will check labs as below and f/u pending lab results. If labs are stable and WNL she will not need to have these rechecked for one year at her next annual physical exam. She is to call the office in the meantime if she has any acute issue, questions or concerns.  2. Depression, major, single episode, mild (HCC) Stable. Diagnosis pulled for medication refill. Continue  current medical treatment plan. - escitalopram (LEXAPRO) 10 MG tablet; Take 0.5 tablets (5 mg total) by mouth daily.  Dispense: 45 tablet; Refill: 3  3. Acquired hypothyroidism Stable. Followed by Endocrinology. Patient requesting labs. Ordered below. Continue Levothyroxine 34mcg. - TSH + free T4  4. Fibrocystic breast disease (FCBD), unspecified laterality Bilateral. Mammograms are completed at [place].   5. Family history of diabetes mellitus Will check labs as below and f/u pending results. - HgB  A1c  6. Postmenopausal estrogen deficiency On HRT. Due for repeat BMD. Ordered and faxed to [facility]. Will check labs as below and f/u pending results. - Vitamin D (25 hydroxy) - DG Bone Density; Future  7. Osteopenia of multiple sites See above medical treatment plan. - DG Bone Density; Future  8. Class 1 obesity due to excess calories with serious comorbidity and body mass index (BMI) of 32.0 to 32.9 in adult Counseled patient on healthy lifestyle modifications including dieting and exercise. Will check labs as below and f/u pending results. - CBC w/Diff/Platelet - Comprehensive Metabolic Panel (CMET) - Lipid Panel With LDL/HDL Ratio - HgB A1c  9. Arthralgia, unspecified joint Multiple joints with new family history of autoimmune diseases. Will check labs as below and f/u pending results.  Lump in hand will be monitored for changes at this time. Suspect early trigger finger but since not triggering and lump is not causing pain or other symptoms will monitor for changes. If anything changes or patient desires evaluation will refer to ortho hand. - ANA,IFA RA Diag Pnl w/rflx Tit/Patn  10. Family history of systemic lupus erythematosus See above medical treatment plan. Granddaughter. - ANA,IFA RA Diag Pnl w/rflx Tit/Patn  11. Family history of arthritis See above medical treatment plan. Granddaughter. - ANA,IFA RA Diag Pnl w/rflx Tit/Patn  12. Benign essential microscopic hematuria UA normal. - POCT Urinalysis Dipstick   No follow-ups on file.     Reynolds Bowl, PA-C, have reviewed all documentation for this visit. The documentation on 06/15/20 for the exam, diagnosis, procedures, and orders are all accurate and complete.   Rubye Beach  [facility] [phone] (phone) [phone] (fax)  [place]

## 2020-06-15 ENCOUNTER — Encounter: Payer: Self-pay | Admitting: Physician Assistant

## 2020-06-16 LAB — CBC WITH DIFFERENTIAL/PLATELET
Basophils Absolute: 0 10*3/uL (ref 0.0–0.2)
Basos: 1 %
EOS (ABSOLUTE): 0.2 10*3/uL (ref 0.0–0.4)
Eos: 2 %
Hematocrit: 41.3 % (ref 34.0–46.6)
Hemoglobin: 14.2 g/dL (ref 11.1–15.9)
Immature Grans (Abs): 0 10*3/uL (ref 0.0–0.1)
Immature Granulocytes: 0 %
Lymphocytes Absolute: 1.6 10*3/uL (ref 0.7–3.1)
Lymphs: 25 %
MCH: 31.8 pg (ref 26.6–33.0)
MCHC: 34.4 g/dL (ref 31.5–35.7)
MCV: 93 fL (ref 79–97)
Monocytes Absolute: 0.6 10*3/uL (ref 0.1–0.9)
Monocytes: 10 %
Neutrophils Absolute: 4 10*3/uL (ref 1.4–7.0)
Neutrophils: 62 %
Platelets: 273 10*3/uL (ref 150–450)
RBC: 4.46 x10E6/uL (ref 3.77–5.28)
RDW: 11.9 % (ref 11.7–15.4)
WBC: 6.5 10*3/uL (ref 3.4–10.8)

## 2020-06-16 LAB — ANA,IFA RA DIAG PNL W/RFLX TIT/PATN
ANA Titer 1: POSITIVE — AB
Cyclic Citrullin Peptide Ab: 7 units (ref 0–19)
Rheumatoid fact SerPl-aCnc: <10 IU/mL (ref <14.0)

## 2020-06-16 LAB — FANA STAINING PATTERNS
Homogeneous Pattern: 1:320 {titer} — ABNORMAL HIGH
Speckled Pattern: 1:320 {titer} — ABNORMAL HIGH

## 2020-06-16 LAB — COMPREHENSIVE METABOLIC PANEL
ALT: 16 IU/L (ref 0–32)
AST: 20 IU/L (ref 0–40)
Albumin/Globulin Ratio: 2 (ref 1.2–2.2)
Albumin: 4.4 g/dL (ref 3.7–4.7)
Alkaline Phosphatase: 77 IU/L (ref 44–121)
BUN/Creatinine Ratio: 13 (ref 12–28)
BUN: 12 mg/dL (ref 8–27)
Bilirubin Total: 0.5 mg/dL (ref 0.0–1.2)
CO2: 24 mmol/L (ref 20–29)
Calcium: 9.5 mg/dL (ref 8.7–10.3)
Chloride: 103 mmol/L (ref 96–106)
Creatinine, Ser: 0.89 mg/dL (ref 0.57–1.00)
GFR calc Af Amer: 74 mL/min/{1.73_m2} (ref >59)
GFR calc non Af Amer: 65 mL/min/{1.73_m2} (ref >59)
Globulin, Total: 2.2 g/dL (ref 1.5–4.5)
Glucose: 102 mg/dL — ABNORMAL HIGH (ref 65–99)
Potassium: 4.7 mmol/L (ref 3.5–5.2)
Sodium: 142 mmol/L (ref 134–144)
Total Protein: 6.6 g/dL (ref 6.0–8.5)

## 2020-06-16 LAB — HEMOGLOBIN A1C
Est. average glucose Bld gHb Est-mCnc: 114 mg/dL
Hgb A1c MFr Bld: 5.6 % (ref 4.8–5.6)

## 2020-06-16 LAB — LIPID PANEL WITH LDL/HDL RATIO
Cholesterol, Total: 212 mg/dL — ABNORMAL HIGH (ref 100–199)
HDL: 54 mg/dL (ref >39)
LDL Chol Calc (NIH): 145 mg/dL — ABNORMAL HIGH (ref 0–99)
LDL/HDL Ratio: 2.7 ratio (ref 0.0–3.2)
Triglycerides: 74 mg/dL (ref 0–149)
VLDL Cholesterol Cal: 13 mg/dL (ref 5–40)

## 2020-06-16 LAB — TSH+FREE T4
Free T4: 1.34 ng/dL (ref 0.82–1.77)
TSH: 1.2 u[IU]/mL (ref 0.450–4.500)

## 2020-06-16 LAB — VITAMIN D 25 HYDROXY (VIT D DEFICIENCY, FRACTURES): Vit D, 25-Hydroxy: 21.1 ng/mL — ABNORMAL LOW (ref 30.0–100.0)

## 2020-06-17 ENCOUNTER — Encounter: Payer: Self-pay | Admitting: Physician Assistant

## 2020-06-17 ENCOUNTER — Telehealth: Payer: Self-pay

## 2020-06-17 DIAGNOSIS — M2550 Pain in unspecified joint: Secondary | ICD-10-CM

## 2020-06-17 DIAGNOSIS — R768 Other specified abnormal immunological findings in serum: Secondary | ICD-10-CM

## 2020-06-17 DIAGNOSIS — Z8269 Family history of other diseases of the musculoskeletal system and connective tissue: Secondary | ICD-10-CM

## 2020-06-17 DIAGNOSIS — M791 Myalgia, unspecified site: Secondary | ICD-10-CM

## 2020-06-17 NOTE — Telephone Encounter (Signed)
-----   Message from Mar Daring, [place] sent at 06/17/2020  9:43 AM EST ----- Blood count is normal. Kidney and liver function are normal. Sodium, potassium, and calcium are normal. Cholesterol did increase slightly compared to last year. Continue working on healthy lifestyle modifications. A1c/sugar are normal. Vit D is low. Would recommend to add VIT D 1000-2000 IU OTC to what you are already taking. Thyroid is normal. Screening ANA panel for autoimmune disease is positive with an elevated positive ratio. With family history in the twins, I would recommend a referral to Rheumatology for further evaluation. Have you seen anyone in the past, or have any preference?

## 2020-06-17 NOTE — Telephone Encounter (Signed)
Written by Mar Daring, PA-C on 06/17/2020 9:43 AM EST View Full Comments Seen by patient [name] on 06/17/2020 3:03 PM

## 2020-06-21 NOTE — Addendum Note (Signed)
Addended by: Mar Daring on: 06/21/2020 01:36 PM   Modules accepted: Orders

## 2020-08-04 DIAGNOSIS — E039 Hypothyroidism, unspecified: Secondary | ICD-10-CM | POA: Diagnosis not present

## 2020-08-11 DIAGNOSIS — E669 Obesity, unspecified: Secondary | ICD-10-CM | POA: Diagnosis not present

## 2020-08-11 DIAGNOSIS — E039 Hypothyroidism, unspecified: Secondary | ICD-10-CM | POA: Diagnosis not present

## 2020-08-12 ENCOUNTER — Ambulatory Visit (INDEPENDENT_AMBULATORY_CARE_PROVIDER_SITE_OTHER): Payer: Medicare HMO | Admitting: Adult Health

## 2020-08-12 ENCOUNTER — Other Ambulatory Visit: Payer: Self-pay

## 2020-08-12 ENCOUNTER — Encounter: Payer: Self-pay | Admitting: Adult Health

## 2020-08-12 VITALS — BP 126/48 | HR 65 | Temp 98.6°F | Resp 15 | Wt 186.2 lb

## 2020-08-12 DIAGNOSIS — R102 Pelvic and perineal pain: Secondary | ICD-10-CM | POA: Insufficient documentation

## 2020-08-12 DIAGNOSIS — R350 Frequency of micturition: Secondary | ICD-10-CM | POA: Diagnosis not present

## 2020-08-12 MED ORDER — CEPHALEXIN 500 MG PO CAPS: 500.0000 mg | ORAL_CAPSULE | Freq: Two times a day (BID) | ORAL | 0 refills | Status: DC

## 2020-08-12 NOTE — Progress Notes (Signed)
Established patient visit   Patient: Meredith Scott   DOB: 10/06/1945   75 y.o. Female  MRN: [id number] Visit Date: 08/12/2020  Today's healthcare provider: Marcille Buffy, FNP   No chief complaint on file.  Subjective    Urinary Frequency  This is a new problem. The current episode started yesterday. The problem occurs every urination. The problem has been unchanged. Associated symptoms include frequency and urgency. Pertinent negatives include no chills, discharge, flank pain, hematuria, hesitancy, nausea, possible pregnancy, sweats or vomiting.    She reports frequent urination since yesterday. Lower pelvic pressure yesterday.  Husband has been on home from hospital since one week with sepsis, and she has strength.  Denies any back pain or flank pain.  denies any history of kidney stones.   Has seen urology in past for hematuria work up was negative she reports.  deneis any visible/ gross hematuria. Bowels within normal .   Patient  denies any fever, body aches,chills, rash, chest pain, shortness of breath, nausea, vomiting, or diarrhea.    Patient Active Problem List   Diagnosis Date Noted  . Pelvic pressure in female 08/12/2020  . Urinary frequency 08/12/2020  . Family history of systemic lupus erythematosus 06/14/2020  . Family history of arthritis 06/14/2020  . Knee pain 06/06/2019  . Osteoarthritis of knee 06/06/2019  . Sprain of knee 06/06/2019  . Pancreatic cyst 01/02/2019  . Abnormal weight gain 07/31/2018  . Incisional hernia, without obstruction or gangrene 04/17/2018  . Other constipation 12/26/2017  . History of colonic polyps 12/26/2017  . Diverticulosis 12/24/2017  . Elevated LDL cholesterol level 05/28/2015  . Acid reflux 04/17/2015  . Acquired hypothyroidism 04/17/2015  . Arthritis 04/17/2015  . Blood pressure elevated without history of HTN 04/17/2015  . Family history of diabetes mellitus 04/17/2015  . Allergic rhinitis 04/07/2015   . Anxiety 04/07/2015  . Arthropathy of temporomandibular joint 04/07/2015  . Breath shortness 04/07/2015  . Spasm 04/07/2015  . Climacteric 04/07/2015  . Adenopathy, cervical 04/07/2015  . Asthma 02/02/2015  . Fibrocystic breast disease 11/26/2012   Past Medical History:  Diagnosis Date  . Anxiety   . Asthma   . Asymptomatic varicose veins 2011  . Breast screening, unspecified 2013  . Colon polyp   . Diffuse cystic mastopathy 2013  . Hernia of abdominal wall   . History of hiatal hernia   . Hypertension   . Hypothyroidism    Dr Eddie Dibbles  . Obesity, unspecified 2013  . PONV (postoperative nausea and vomiting)   . Screening for obesity 2013  . Skin cancer of face 2018   squamous/ left cheek  . Special screening for malignant neoplasms, colon 2013   Allergies  Allergen Reactions  . Shellfish Allergy Other (See Comments)    Throat closes  . Sulfa Antibiotics Swelling  . Neosporin [Neomycin-Bacitracin Zn-Polymyx] Rash       Medications: Outpatient Medications Prior to Visit  Medication Sig  . Ascorbic Acid (VITAMIN C PO) Take 500 mg by mouth daily.   Marland Kitchen ascorbic acid (VITAMIN C) 500 MG tablet Take by mouth.   Marland Kitchen aspirin 81 MG tablet Take 81 mg by mouth daily.  . beclomethasone (BECONASE-AQ) 42 MCG/SPRAY nasal spray Place 1 spray into both nostrils 2 (two) times daily. As needed (seasonal)   . budesonide-formoterol (SYMBICORT) 160-4.5 MCG/ACT inhaler Inhale 2 puffs into the lungs 2 (two) times daily.  . calcium-vitamin D (OSCAL WITH D) 500-200 MG-UNIT tablet Take 1 tablet by  mouth daily with breakfast.   . COMBIVENT RESPIMAT 20-100 MCG/ACT AERS respimat USE 1 PUFF EVERY 4 HOURS AS NEEDED FOR WHEEZING  . escitalopram (LEXAPRO) 10 MG tablet Take 0.5 tablets (5 mg total) by mouth daily.  Marland Kitchen estrogen-methylTESTOSTERone (EST ESTROGENS-METHYLTEST HS) 0.625-1.25 MG tablet TAKE 1 TABLET BY MOUTH EVERY 6 DAYS  . Fish Oil OIL by Does not apply route daily.   Marland Kitchen levothyroxine (SYNTHROID,  LEVOTHROID) 25 MCG tablet Take 25 mcg by mouth daily before breakfast. 2 on Sunday and Thursday  . loratadine (CLARITIN) 10 MG tablet Take 1 tablet (10 mg total) by mouth daily as needed.  . metroNIDAZOLE (METROCREAM) 0.75 % cream APP EXT TO FACE ONCE DAILY UTD  . Multiple Vitamin (MULTIVITAMIN) capsule Take 1 capsule by mouth daily.   . Wheat Dextrin (BENEFIBER PO) Take by mouth daily. Or as EOD   No facility-administered medications prior to visit.    Review of Systems  Constitutional: Negative for chills.  Gastrointestinal: Negative for nausea and vomiting.  Genitourinary: Positive for frequency and urgency. Negative for flank pain, hematuria and hesitancy.    Last CBC Lab Results  Component Value Date   WBC 6.5 06/14/2020   HGB 14.2 06/14/2020   HCT 41.3 06/14/2020   MCV 93 06/14/2020   MCH 31.8 06/14/2020   RDW 11.9 06/14/2020   PLT 273 64/33/2951   Last metabolic panel Lab Results  Component Value Date   GLUCOSE 102 (H) 06/14/2020   NA 142 06/14/2020   K 4.7 06/14/2020   CL 103 06/14/2020   CO2 24 06/14/2020   BUN 12 06/14/2020   CREATININE 0.89 06/14/2020   GFRNONAA 65 06/14/2020   GFRAA 74 06/14/2020   CALCIUM 9.5 06/14/2020   PROT 6.6 06/14/2020   ALBUMIN 4.4 06/14/2020   LABGLOB 2.2 06/14/2020   AGRATIO 2.0 06/14/2020   BILITOT 0.5 06/14/2020   ALKPHOS 77 06/14/2020   AST 20 06/14/2020   ALT 16 06/14/2020   Last lipids Lab Results  Component Value Date   CHOL 212 (H) 06/14/2020   HDL 54 06/14/2020   LDLCALC 145 (H) 06/14/2020   TRIG 74 06/14/2020   CHOLHDL 3.7 06/06/2019   Last hemoglobin A1c Lab Results  Component Value Date   HGBA1C 5.6 06/14/2020   Last thyroid functions Lab Results  Component Value Date   TSH 1.200 06/14/2020   Last vitamin D Lab Results  Component Value Date   VD25OH 21.1 (L) 06/14/2020   Last vitamin B12 and Folate No results found for: VITAMINB12, FOLATE     Objective    BP (!) 126/48   Pulse 65   Temp  98.6 F (37 C) (Oral)   Resp 15   Wt 186 lb 3.2 oz (84.5 kg)   SpO2 100%   BMI 32.98 kg/m  BP Readings from Last 3 Encounters:  08/12/20 (!) 126/48  06/14/20 (!) 138/57  02/13/20 124/69   Wt Readings from Last 3 Encounters:  08/12/20 186 lb 3.2 oz (84.5 kg)  06/14/20 184 lb 3.2 oz (83.6 kg)  02/13/20 182 lb (82.6 kg)   Physical Exam Constitutional:      General: She is not in acute distress.    Appearance: Normal appearance. She is obese. She is not ill-appearing, toxic-appearing or diaphoretic.  HENT:     Head: Normocephalic and atraumatic.     Right Ear: External ear normal.     Left Ear: External ear normal.     Nose: Nose normal.  Mouth/Throat:     Mouth: Mucous membranes are moist.  Eyes:     Pupils: Pupils are equal, round, and reactive to light.  Cardiovascular:     Rate and Rhythm: Normal rate and regular rhythm.     Pulses: Normal pulses.     Heart sounds: Normal heart sounds.  Pulmonary:     Effort: Pulmonary effort is normal. No respiratory distress.     Breath sounds: Normal breath sounds. No stridor. No wheezing, rhonchi or rales.  Chest:     Chest wall: No tenderness.  Abdominal:     General: Bowel sounds are normal. There is no distension.     Palpations: Abdomen is soft.     Tenderness: There is abdominal tenderness (suprapubic ). There is no right CVA tenderness, left CVA tenderness or guarding.  Musculoskeletal:        General: Normal range of motion.     Cervical back: Normal range of motion and neck supple.  Skin:    General: Skin is warm.     Findings: No erythema or rash.  Neurological:     Mental Status: She is alert and oriented to person, place, and time.     Coordination: Coordination normal.     Deep Tendon Reflexes: Reflexes normal.  Psychiatric:        Mood and Affect: Mood normal.        Behavior: Behavior normal.        Thought Content: Thought content normal.        Judgment: Judgment normal.          Physical Activity:  Insufficiently Active  . Days of Exercise per Week: 3 days  . Minutes of Exercise per Session: 30 min      Results for orders placed or performed in visit on 08/12/20  Urine Culture   Specimen: Urine   Urine  Result Value Ref Range   Urine Culture, Routine Final report    Organism ID, Bacteria No growth   Urinalysis, microscopic only  Result Value Ref Range   WBC, UA None seen 0 - 5 /hpf   RBC None seen 0 - 2 /hpf   Epithelial Cells (non renal) 0-10 0 - 10 /hpf   Casts None seen None seen /lpf   Bacteria, UA None seen None seen/Few    Assessment & Plan     Urinary frequency - Plan: POCT urinalysis dipstick, Urine Culture, cephALEXin (KEFLEX) 500 MG capsule, Urinalysis, microscopic only  Pelvic pressure in female   Advised of POCT urine with hematuria only. Will send for culture and microscopic. Recommend her increase fluids and decrease caffeine. Will send keflex prophylactic given symptoms if symptoms persisting can go ahead and start antibiotics,.Will call with urine results.  Orders Placed This Encounter  Procedures  . Urine Culture  . Urinalysis, microscopic only  . POCT urinalysis dipstick   Return in about 1 week (around 08/19/2020), or if symptoms worsen or fail to improve, for at any time for any worsening symptoms, Go to Emergency room/ urgent care if worse.      Red Flags discussed. The patient was given clear instructions to go to ER or return to medical center if any red flags develop, symptoms do not improve, worsen or new problems develop. They verbalized understanding.  The entirety of the information documented in the History of Present Illness, Review of Systems and Physical Exam were personally obtained by me. Portions of this information were initially documented by the [facility] and  reviewed by me for thoroughness and accuracy.      Marcille Buffy, [place] [phone] (phone) [phone] (fax)  [place]

## 2020-08-12 NOTE — Patient Instructions (Signed)
Urinary Tract Infection, Adult A urinary tract infection (UTI) is an infection of any part of the urinary tract. The urinary tract includes:  The kidneys.  The ureters.  The bladder.  The urethra. These organs make, store, and get rid of pee (urine) in the body. What are the causes? This infection is caused by germs (bacteria) in your genital area. These germs grow and cause swelling (inflammation) of your urinary tract. What increases the risk? The following factors may make you more likely to develop this condition:  Using a small, thin tube (catheter) to drain pee.  Not being able to control when you pee or poop (incontinence).  Being female. If you are female, these things can increase the risk: ? Using these methods to prevent pregnancy:  A medicine that kills sperm (spermicide).  A device that blocks sperm (diaphragm). ? Having low levels of a female hormone (estrogen). ? Being pregnant. You are more likely to develop this condition if:  You have genes that add to your risk.  You are sexually active.  You take antibiotic medicines.  You have trouble peeing because of: ? A prostate that is bigger than normal, if you are female. ? A blockage in the part of your body that drains pee from the bladder. ? A kidney stone. ? A nerve condition that affects your bladder. ? Not getting enough to drink. ? Not peeing often enough.  You have other conditions, such as: ? Diabetes. ? A weak disease-fighting system (immune system). ? Sickle cell disease. ? Gout. ? Injury of the spine. What are the signs or symptoms? Symptoms of this condition include:  Needing to pee right away.  Peeing small amounts often.  Pain or burning when peeing.  Blood in the pee.  Pee that smells bad or not like normal.  Trouble peeing.  Pee that is cloudy.  Fluid coming from the vagina, if you are female.  Pain in the belly or lower back. Other symptoms  include:  Vomiting.  Not feeling hungry.  Feeling mixed up (confused). This may be the first symptom in older adults.  Being tired and grouchy (irritable).  A fever.  Watery poop (diarrhea). How is this treated?  Taking antibiotic medicine.  Taking other medicines.  Drinking enough water. In some cases, you may need to see a specialist. Follow these instructions at home: Medicines  Take over-the-counter and prescription medicines only as told by your doctor.  If you were prescribed an antibiotic medicine, take it as told by your doctor. Do not stop taking it even if you start to feel better. General instructions  Make sure you: ? Pee until your bladder is empty. ? Do not hold pee for a long time. ? Empty your bladder after sex. ? Wipe from front to back after peeing or pooping if you are a female. Use each tissue one time when you wipe.  Drink enough fluid to keep your pee pale yellow.  Keep all follow-up visits.   Contact a doctor if:  You do not get better after 1-2 days.  Your symptoms go away and then come back. Get help right away if:  You have very bad back pain.  You have very bad pain in your lower belly.  You have a fever.  You have chills.  You feeling like you will vomit or you vomit. Summary  A urinary tract infection (UTI) is an infection of any part of the urinary tract.  This condition is caused by germs in your genital area.  There are many risk factors for a UTI.  Treatment includes antibiotic medicines.  Drink enough fluid to keep your pee pale yellow. This information is not intended to replace advice given to you by your health care provider. Make sure you discuss any questions you have with your health care provider. Document Revised: 02/13/2020 Document Reviewed: 02/13/2020 Elsevier Patient Education  [place]. Keflex/ Cephalexin Tablets or Capsules What is this medicine? CEPHALEXIN (sef a LEX in) is a cephalosporin  antibiotic. It treats some infections caused by bacteria. It will not work for colds, the flu, or other viruses. This medicine may be used for other purposes; ask your health care provider or pharmacist if you have questions. COMMON BRAND NAME(S): Biocef, Daxbia, Keflex, Keftab What should I tell my health care provider before I take this medicine? They need to know if you have any of these conditions:  bleeding disorder  kidney disease  liver disease  seizures  stomach or intestine problems like colitis  an unusual or allergic reaction to cephalexin, other penicillin or cephalosporin antibiotics, other medicines, foods, dyes, or preservatives  pregnant or trying to get pregnant  breast-feeding How should I use this medicine? Take this drug by mouth. Take it as directed on the prescription label at the same time every day. You can take it with or without food. If it upsets your stomach, take it with food. Take all of this drug unless your health care provider tells you to stop it early. Keep taking it even if you think you are better. Talk to your health care provider about the use of this drug in children. While it may be prescribed for selected conditions, precautions do apply. Overdosage: If you think you have taken too much of this medicine contact a poison control center or emergency room at once. NOTE: This medicine is only for you. Do not share this medicine with others. What if I miss a dose? If you miss a dose, take it as soon as you can. If it is almost time for your next dose, take only that dose. Do not take double or extra doses. What may interact with this medicine?  probenecid  some other antibiotics This list may not describe all possible interactions. Give your health care provider a list of all the medicines, herbs, non-prescription drugs, or dietary supplements you use. Also tell them if you smoke, drink alcohol, or use illegal drugs. Some items may interact with  your medicine. What should I watch for while using this medicine? Tell your health care provider if your symptoms do not start to get better or if they get worse. Do not treat diarrhea with over the counter products. Contact your health care provider if you have diarrhea that lasts more than 2 days or if it is severe and watery. This medicine may cause serious skin reactions. They can happen weeks to months after starting the medicine. Contact your health care provider right away if you notice fevers or flu-like symptoms with a rash. The rash may be red or purple and then turn into blisters or peeling of the skin. Or, you might notice a red rash with swelling of the face, lips or lymph nodes in your neck or under your arms. If you have diabetes, you may get a false-positive result for sugar in your urine. Check with your health care provider. What side effects may I notice from receiving this medicine? Side  effects that you should report to your doctor or health care provider as soon as possible:  allergic reactions (skin rash, itching or hives; swelling of the face, lips, or tongue)  bloody or watery diarrhea  fever  kidney injury (trouble passing urine or change in the amount of urine)  low red blood cell counts (trouble breathing; feeling faint; lightheaded, falls; unusually weak or tired)  redness, blistering, peeling, or loosening of the skin, including inside the mouth  unusual bruising or bleeding Side effects that usually do not require medical attention (report to your doctor or health care provider if they continue or are bothersome):  headache  dizziness  nausea, vomiting  unusual vaginal discharge, itching, or odor  upset stomach This list may not describe all possible side effects. Call your doctor for medical advice about side effects. You may report side effects to FDA at 1-800-FDA-1088. Where should I keep my medicine? Keep out of the reach of children and  pets. Store at room temperature between 20 and 25 degrees C (68 and 77 degrees F). Throw away any unused drug after the expiration date. NOTE: This sheet is a summary. It may not cover all possible information. If you have questions about this medicine, talk to your doctor, pharmacist, or health care provider.  2021 Elsevier/Gold Standard (2019-05-08 15:26:31)

## 2020-08-13 LAB — URINALYSIS, MICROSCOPIC ONLY
Bacteria, UA: NONE SEEN
Casts: NONE SEEN /lpf
RBC, Urine: NONE SEEN /hpf (ref 0–2)
WBC, UA: NONE SEEN /hpf (ref 0–5)

## 2020-08-13 NOTE — Progress Notes (Signed)
Cbc is within normal limits. Urine culture is still pending.  Microscopic urine within normal limits.  Follow up if symptoms changing or worsening at anytime.

## 2020-08-14 LAB — URINE CULTURE: Organism ID, Bacteria: NO GROWTH

## 2020-08-19 DIAGNOSIS — M85852 Other specified disorders of bone density and structure, left thigh: Secondary | ICD-10-CM | POA: Diagnosis not present

## 2020-08-19 DIAGNOSIS — E2839 Other primary ovarian failure: Secondary | ICD-10-CM | POA: Diagnosis not present

## 2020-08-19 LAB — HM DEXA SCAN

## 2020-08-20 ENCOUNTER — Encounter: Payer: Self-pay | Admitting: Physician Assistant

## 2020-08-20 ENCOUNTER — Ambulatory Visit (INDEPENDENT_AMBULATORY_CARE_PROVIDER_SITE_OTHER): Payer: Medicare HMO | Admitting: Physician Assistant

## 2020-08-20 ENCOUNTER — Other Ambulatory Visit (HOSPITAL_COMMUNITY)
Admission: RE | Admit: 2020-08-20 | Discharge: 2020-08-20 | Disposition: A | Discharge status: Home / Self Care | Payer: Medicare HMO | Source: Ambulatory Visit | Attending: Physician Assistant | Admitting: Physician Assistant

## 2020-08-20 ENCOUNTER — Other Ambulatory Visit: Payer: Self-pay

## 2020-08-20 VITALS — BP 126/72 | HR 68 | Temp 98.4°F | Wt 181.0 lb

## 2020-08-20 DIAGNOSIS — R102 Pelvic and perineal pain: Secondary | ICD-10-CM | POA: Diagnosis present

## 2020-08-20 DIAGNOSIS — R350 Frequency of micturition: Secondary | ICD-10-CM | POA: Insufficient documentation

## 2020-08-20 DIAGNOSIS — R319 Hematuria, unspecified: Secondary | ICD-10-CM

## 2020-08-20 LAB — POCT URINALYSIS DIPSTICK
Bilirubin, UA: NEGATIVE
Glucose, UA: NEGATIVE
Ketones, UA: NEGATIVE
Leukocytes, UA: NEGATIVE
Nitrite, UA: NEGATIVE
Protein, UA: NEGATIVE
Spec Grav, UA: 1.025 (ref 1.010–1.025)
Urobilinogen, UA: 0.2 E.U./dL
pH, UA: 5 (ref 5.0–8.0)

## 2020-08-20 NOTE — Progress Notes (Signed)
Established patient visit   Patient: Meredith Scott   DOB: April 11, 1946   75 y.o. Female  MRN: [id number] Visit Date: 08/20/2020  Today's healthcare provider: Mar Daring, PA-C   Chief Complaint  Patient presents with  . Urinary Tract Infection   Subjective    Urinary Tract Infection  This is a new problem. The problem has been gradually improving. There has been no fever. Pertinent negatives include no frequency, hematuria, nausea, urgency or vomiting. She has tried antibiotics and increased fluids for the symptoms. The treatment provided moderate relief.      Patient Active Problem List   Diagnosis Date Noted  . Pelvic pressure in female 08/12/2020  . Urinary frequency 08/12/2020  . Family history of systemic lupus erythematosus 06/14/2020  . Family history of arthritis 06/14/2020  . Knee pain 06/06/2019  . Osteoarthritis of knee 06/06/2019  . Sprain of knee 06/06/2019  . Pancreatic cyst 01/02/2019  . Abnormal weight gain 07/31/2018  . Incisional hernia, without obstruction or gangrene 04/17/2018  . Other constipation 12/26/2017  . History of colonic polyps 12/26/2017  . Diverticulosis 12/24/2017  . Elevated LDL cholesterol level 05/28/2015  . Acid reflux 04/17/2015  . Acquired hypothyroidism 04/17/2015  . Arthritis 04/17/2015  . Blood pressure elevated without history of HTN 04/17/2015  . Family history of diabetes mellitus 04/17/2015  . Allergic rhinitis 04/07/2015  . Anxiety 04/07/2015  . Arthropathy of temporomandibular joint 04/07/2015  . Breath shortness 04/07/2015  . Spasm 04/07/2015  . Climacteric 04/07/2015  . Adenopathy, cervical 04/07/2015  . Asthma 02/02/2015  . Fibrocystic breast disease 11/26/2012   Past Medical History:  Diagnosis Date  . Anxiety   . Asthma   . Asymptomatic varicose veins 2011  . Breast screening, unspecified 2013  . Colon polyp   . Diffuse cystic mastopathy 2013  . Hernia of abdominal wall   . History of  hiatal hernia   . Hypertension   . Hypothyroidism    Dr Eddie Dibbles  . Obesity, unspecified 2013  . PONV (postoperative nausea and vomiting)   . Screening for obesity 2013  . Skin cancer of face 2018   squamous/ left cheek  . Special screening for malignant neoplasms, colon 2013   Social History   Tobacco Use  . Smoking status: Never Smoker  . Smokeless tobacco: Never Used  Vaping Use  . Vaping Use: Never used  Substance Use Topics  . Alcohol use: No  . Drug use: No   Allergies  Allergen Reactions  . Shellfish Allergy Other (See Comments)    Throat closes  . Sulfa Antibiotics Swelling  . Neosporin [Neomycin-Bacitracin Zn-Polymyx] Rash     Medications: Outpatient Medications Prior to Visit  Medication Sig  . metroNIDAZOLE (METROCREAM) 0.75 % cream APP EXT TO FACE ONCE DAILY UTD  . Ascorbic Acid (VITAMIN C PO) Take 500 mg by mouth daily.   Marland Kitchen ascorbic acid (VITAMIN C) 500 MG tablet Take by mouth.   Marland Kitchen aspirin 81 MG tablet Take 81 mg by mouth daily.  . budesonide-formoterol (SYMBICORT) 160-4.5 MCG/ACT inhaler Inhale 2 puffs into the lungs 2 (two) times daily.  . calcium-vitamin D (OSCAL WITH D) 500-200 MG-UNIT tablet Take 1 tablet by mouth daily with breakfast.   . COMBIVENT RESPIMAT 20-100 MCG/ACT AERS respimat USE 1 PUFF EVERY 4 HOURS AS NEEDED FOR WHEEZING  . escitalopram (LEXAPRO) 10 MG tablet Take 0.5 tablets (5 mg total) by mouth daily.  Marland Kitchen estrogen-methylTESTOSTERone (EST ESTROGENS-METHYLTEST HS) 0.625-1.25 MG tablet  TAKE 1 TABLET BY MOUTH EVERY 6 DAYS  . Fish Oil OIL by Does not apply route daily.   Marland Kitchen levothyroxine (SYNTHROID, LEVOTHROID) 25 MCG tablet Take 25 mcg by mouth daily before breakfast. 2 on Sunday and Thursday  . Wheat Dextrin (BENEFIBER PO) Take by mouth daily. Or as EOD  . [DISCONTINUED] beclomethasone (BECONASE-AQ) 42 MCG/SPRAY nasal spray Place 1 spray into both nostrils 2 (two) times daily. As needed (seasonal)   . [DISCONTINUED] cephALEXin (KEFLEX) 500 MG  capsule Take 1 capsule (500 mg total) by mouth 2 (two) times daily.  . [DISCONTINUED] loratadine (CLARITIN) 10 MG tablet Take 1 tablet (10 mg total) by mouth daily as needed.  . [DISCONTINUED] Multiple Vitamin (MULTIVITAMIN) capsule Take 1 capsule by mouth daily.    No facility-administered medications prior to visit.    Review of Systems  Constitutional: Negative.   Gastrointestinal: Positive for constipation. Negative for abdominal distention, abdominal pain, anal bleeding, blood in stool, diarrhea, nausea, rectal pain and vomiting.  Genitourinary: Positive for dysuria. Negative for decreased urine volume, difficulty urinating, dyspareunia, frequency, hematuria, urgency, vaginal bleeding, vaginal discharge and vaginal pain.  Neurological: Negative for dizziness, light-headedness and headaches.        Objective    BP 126/72 (BP Location: Left Arm, Patient Position: Sitting, Cuff Size: Large)   Pulse 68   Temp 98.4 F (36.9 C) (Oral)   Wt 181 lb (82.1 kg)   BMI 32.06 kg/m    Physical Exam Constitutional:      General: She is not in acute distress.    Appearance: Normal appearance. She is well-developed and well-nourished. She is not ill-appearing or diaphoretic.  Cardiovascular:     Rate and Rhythm: Normal rate and regular rhythm.     Heart sounds: Normal heart sounds. No murmur heard. No friction rub. No gallop.   Pulmonary:     Effort: Pulmonary effort is normal. No respiratory distress.     Breath sounds: Normal breath sounds. No wheezing or rales.  Abdominal:     General: Abdomen is flat. Bowel sounds are normal. There is no distension.     Palpations: Abdomen is soft. There is no hepatosplenomegaly or mass.     Tenderness: There is no abdominal tenderness. There is no CVA tenderness, right CVA tenderness, left CVA tenderness, guarding or rebound.  Skin:    General: Skin is warm and dry.  Neurological:     Mental Status: She is alert and oriented to person, place, and  time.     Results for orders placed or performed in visit on 08/20/20  CULTURE, URINE COMPREHENSIVE   Specimen: Urine   Urine  Result Value Ref Range   Urine Culture, Comprehensive Final report (A)    Organism ID, Bacteria Escherichia coli (A)    Organism ID, Bacteria Comment    ANTIMICROBIAL SUSCEPTIBILITY Comment   Urine Microscopic  Result Value Ref Range   WBC, UA 0-5 0 - 5 /hpf   RBC 3-10 (A) 0 - 2 /hpf   Epithelial Cells (non renal) 0-10 0 - 10 /hpf   Casts None seen None seen /lpf   Bacteria, UA None seen None seen/Few  POCT urinalysis dipstick  Result Value Ref Range   Color, UA     Clarity, UA     Glucose, UA Negative Negative   Bilirubin, UA Negative    Ketones, UA Negative    Spec Grav, UA 1.025 1.010 - 1.025   Blood, UA Moderate    pH,  UA 5.0 5.0 - 8.0   Protein, UA Negative Negative   Urobilinogen, UA 0.2 0.2 or 1.0 E.U./dL   Nitrite, UA Negative    Leukocytes, UA Negative Negative   Appearance     Odor    Cervicovaginal ancillary only  Result Value Ref Range   Bacterial Vaginitis (gardnerella) Negative    Candida Vaginitis Negative    Candida Glabrata Negative    Comment      Normal Reference Range Bacterial Vaginosis - Negative   Comment Normal Reference Range Candida Species - Negative    Comment Normal Reference Range Candida Galbrata - Negative     Assessment & Plan     1. Urinary frequency Vaginal swab collected to rule out yeast infection or BV.  - Cervicovaginal ancillary only  2. Pelvic pressure in female UA unremarkable. Will send for culture and treat accordingly based on results.  - POCT urinalysis dipstick - Cervicovaginal ancillary only  3. Hematuria, unspecified type See above medical treatment plan. - CULTURE, URINE COMPREHENSIVE - Urinalysis, microscopic only   No follow-ups on file.      Reynolds Bowl, PA-C, have reviewed all documentation for this visit. The documentation on 09/07/20 for the exam, diagnosis,  procedures, and orders are all accurate and complete.   Rubye Beach  [facility] [phone] (phone) [phone] (fax)  [place]

## 2020-08-24 ENCOUNTER — Other Ambulatory Visit: Payer: Self-pay | Admitting: Physician Assistant

## 2020-08-24 ENCOUNTER — Encounter: Payer: Self-pay | Admitting: Physician Assistant

## 2020-08-24 DIAGNOSIS — N3001 Acute cystitis with hematuria: Secondary | ICD-10-CM

## 2020-08-24 LAB — CERVICOVAGINAL ANCILLARY ONLY
Bacterial Vaginitis (gardnerella): NEGATIVE
Candida Glabrata: NEGATIVE
Candida Vaginitis: NEGATIVE
Comment: NEGATIVE
Comment: NEGATIVE
Comment: NEGATIVE

## 2020-08-24 LAB — URINALYSIS, MICROSCOPIC ONLY
Bacteria, UA: NONE SEEN
Casts: NONE SEEN /lpf

## 2020-08-24 LAB — CULTURE, URINE COMPREHENSIVE

## 2020-08-24 MED ORDER — CIPROFLOXACIN HCL 500 MG PO TABS: 500.0000 mg | ORAL_TABLET | Freq: Two times a day (BID) | ORAL | 0 refills | Status: DC

## 2020-08-24 MED ORDER — NITROFURANTOIN MONOHYD MACRO 100 MG PO CAPS: 100.0000 mg | ORAL_CAPSULE | Freq: Two times a day (BID) | ORAL | 0 refills | Status: DC

## 2020-09-07 ENCOUNTER — Encounter: Payer: Self-pay | Admitting: Physician Assistant

## 2020-09-07 DIAGNOSIS — R768 Other specified abnormal immunological findings in serum: Secondary | ICD-10-CM | POA: Insufficient documentation

## 2020-09-07 DIAGNOSIS — M180 Bilateral primary osteoarthritis of first carpometacarpal joints: Secondary | ICD-10-CM | POA: Insufficient documentation

## 2020-09-07 DIAGNOSIS — E559 Vitamin D deficiency, unspecified: Secondary | ICD-10-CM | POA: Diagnosis not present

## 2020-09-07 DIAGNOSIS — H04123 Dry eye syndrome of bilateral lacrimal glands: Secondary | ICD-10-CM | POA: Diagnosis not present

## 2020-09-10 ENCOUNTER — Other Ambulatory Visit: Payer: Self-pay

## 2020-09-10 ENCOUNTER — Encounter: Payer: Self-pay | Admitting: Physician Assistant

## 2020-09-10 ENCOUNTER — Ambulatory Visit (INDEPENDENT_AMBULATORY_CARE_PROVIDER_SITE_OTHER): Payer: Medicare HMO | Admitting: Physician Assistant

## 2020-09-10 VITALS — BP 131/54 | HR 78 | Temp 98.0°F | Resp 16 | Wt 185.0 lb

## 2020-09-10 DIAGNOSIS — N3001 Acute cystitis with hematuria: Secondary | ICD-10-CM

## 2020-09-10 LAB — POCT URINALYSIS DIPSTICK
Bilirubin, UA: NEGATIVE
Glucose, UA: NEGATIVE
Ketones, UA: NEGATIVE
Leukocytes, UA: NEGATIVE
Nitrite, UA: NEGATIVE
Protein, UA: NEGATIVE
Spec Grav, UA: 1.02 (ref 1.010–1.025)
Urobilinogen, UA: 0.2 E.U./dL
pH, UA: 6.5 (ref 5.0–8.0)

## 2020-09-10 NOTE — Progress Notes (Signed)
Established patient visit   Patient: Meredith Scott   DOB: [birth year]   75 y.o. Female  MRN: [id number] Visit Date: 09/10/2020  Today's healthcare provider: Mar Daring, PA-C   Chief Complaint  Patient presents with  . urine check   Subjective    HPI  Follow up for UTI  The patient was last seen for this 3 weeks ago. Changes made at last visit include starting on Macrobid. After urine cx results abx was changed to Cipro. She reports good compliance with treatment. She feels that condition is Improved. She is not having side effects.    Patient Active Problem List   Diagnosis Date Noted  . Pelvic pressure in female 08/12/2020  . Urinary frequency 08/12/2020  . Family history of systemic lupus erythematosus 06/14/2020  . Family history of arthritis 06/14/2020  . Knee pain 06/06/2019  . Osteoarthritis of knee 06/06/2019  . Sprain of knee 06/06/2019  . Pancreatic cyst 01/02/2019  . Abnormal weight gain 07/31/2018  . Incisional hernia, without obstruction or gangrene 04/17/2018  . Other constipation 12/26/2017  . History of colonic polyps 12/26/2017  . Diverticulosis 12/24/2017  . Elevated LDL cholesterol level 05/28/2015  . Acid reflux 04/17/2015  . Acquired hypothyroidism 04/17/2015  . Arthritis 04/17/2015  . Blood pressure elevated without history of HTN 04/17/2015  . Family history of diabetes mellitus 04/17/2015  . Allergic rhinitis 04/07/2015  . Anxiety 04/07/2015  . Arthropathy of temporomandibular joint 04/07/2015  . Breath shortness 04/07/2015  . Spasm 04/07/2015  . Climacteric 04/07/2015  . Adenopathy, cervical 04/07/2015  . Asthma 02/02/2015  . Fibrocystic breast disease 11/26/2012   Past Medical History:  Diagnosis Date  . Anxiety   . Asthma   . Asymptomatic varicose veins 2011  . Breast screening, unspecified 2013  . Colon polyp   . Diffuse cystic mastopathy 2013  . Hernia of abdominal wall   . History of hiatal hernia   .  Hypertension   . Hypothyroidism    Dr Eddie Dibbles  . Obesity, unspecified 2013  . PONV (postoperative nausea and vomiting)   . Screening for obesity 2013  . Skin cancer of face 2018   squamous/ left cheek  . Special screening for malignant neoplasms, colon 2013       Medications: Outpatient Medications Prior to Visit  Medication Sig  . Ascorbic Acid (VITAMIN C PO) Take 500 mg by mouth daily.   Marland Kitchen ascorbic acid (VITAMIN C) 500 MG tablet Take by mouth.   Marland Kitchen aspirin 81 MG tablet Take 81 mg by mouth daily.  . budesonide-formoterol (SYMBICORT) 160-4.5 MCG/ACT inhaler Inhale 2 puffs into the lungs 2 (two) times daily.  . calcium-vitamin D (OSCAL WITH D) 500-200 MG-UNIT tablet Take 1 tablet by mouth daily with breakfast.   . ciprofloxacin (CIPRO) 500 MG tablet Take 1 tablet (500 mg total) by mouth 2 (two) times daily.  . COMBIVENT RESPIMAT 20-100 MCG/ACT AERS respimat USE 1 PUFF EVERY 4 HOURS AS NEEDED FOR WHEEZING  . escitalopram (LEXAPRO) 10 MG tablet Take 0.5 tablets (5 mg total) by mouth daily.  Marland Kitchen estrogen-methylTESTOSTERone (EST ESTROGENS-METHYLTEST HS) 0.625-1.25 MG tablet TAKE 1 TABLET BY MOUTH EVERY 6 DAYS  . Fish Oil OIL by Does not apply route daily.   Marland Kitchen levothyroxine (SYNTHROID, LEVOTHROID) 25 MCG tablet Take 25 mcg by mouth daily before breakfast. 2 on Sunday and Thursday  . metroNIDAZOLE (METROCREAM) 0.75 % cream APP EXT TO FACE ONCE DAILY UTD  . Wheat Dextrin (  BENEFIBER PO) Take by mouth daily. Or as EOD   No facility-administered medications prior to visit.    Review of Systems  Constitutional: Negative.   Respiratory: Negative.   Cardiovascular: Negative for chest pain and leg swelling.  Gastrointestinal: Negative.   Genitourinary: Negative for decreased urine volume, difficulty urinating, dysuria and frequency.    Last CBC Lab Results  Component Value Date   WBC 6.5 06/14/2020   HGB 14.2 06/14/2020   HCT 41.3 06/14/2020   MCV 93 06/14/2020   MCH 31.8 06/14/2020   RDW  11.9 06/14/2020   PLT 273 38/18/2993   Last metabolic panel Lab Results  Component Value Date   GLUCOSE 102 (H) 06/14/2020   NA 142 06/14/2020   K 4.7 06/14/2020   CL 103 06/14/2020   CO2 24 06/14/2020   BUN 12 06/14/2020   CREATININE 0.89 06/14/2020   GFRNONAA 65 06/14/2020   GFRAA 74 06/14/2020   CALCIUM 9.5 06/14/2020   PROT 6.6 06/14/2020   ALBUMIN 4.4 06/14/2020   LABGLOB 2.2 06/14/2020   AGRATIO 2.0 06/14/2020   BILITOT 0.5 06/14/2020   ALKPHOS 77 06/14/2020   AST 20 06/14/2020   ALT 16 06/14/2020       Objective    BP (!) 131/54   Pulse 78   Temp 98 F (36.7 C)   Resp 16   Wt 185 lb (83.9 kg)   BMI 32.77 kg/m  BP Readings from Last 3 Encounters:  09/10/20 (!) 131/54  08/20/20 126/72  08/12/20 (!) 126/48   Wt Readings from Last 3 Encounters:  09/10/20 185 lb (83.9 kg)  08/20/20 181 lb (82.1 kg)  08/12/20 186 lb 3.2 oz (84.5 kg)       Physical Exam Vitals reviewed.  Constitutional:      General: She is not in acute distress.    Appearance: Normal appearance. She is well-developed and well-nourished. She is not ill-appearing.  HENT:     Head: Normocephalic and atraumatic.  Eyes:     Extraocular Movements: EOM normal.  Pulmonary:     Effort: Pulmonary effort is normal. No respiratory distress.  Musculoskeletal:     Cervical back: Normal range of motion and neck supple.  Neurological:     Mental Status: She is alert.  Psychiatric:        Mood and Affect: Mood and affect normal.        Behavior: Behavior normal.        Thought Content: Thought content normal.        Judgment: Judgment normal.       No results found for any visits on 09/10/20.  Assessment & Plan     1. Acute cystitis with hematuria Symptoms have improved. UA today appears normal, but last one had as well. Will send urine for culture and microanalysis as below. I will f/u pending results.  - POCT urinalysis dipstick - Urine Culture - Urinalysis, microscopic only   No  follow-ups on file.      Reynolds Bowl, PA-C, have reviewed all documentation for this visit. The documentation on 09/10/20 for the exam, diagnosis, procedures, and orders are all accurate and complete.   Rubye Beach  [facility] [phone] (phone) [phone] (fax)  [place]

## 2020-09-11 ENCOUNTER — Encounter: Payer: Self-pay | Admitting: Physician Assistant

## 2020-09-11 LAB — URINALYSIS, MICROSCOPIC ONLY
Bacteria, UA: NONE SEEN
Casts: NONE SEEN /lpf
Epithelial Cells (non renal): NONE SEEN /hpf (ref 0–10)
WBC, UA: NONE SEEN /hpf (ref 0–5)

## 2020-09-12 LAB — URINE CULTURE

## 2020-09-17 DIAGNOSIS — H5213 Myopia, bilateral: Secondary | ICD-10-CM | POA: Diagnosis not present

## 2020-09-17 DIAGNOSIS — H524 Presbyopia: Secondary | ICD-10-CM | POA: Diagnosis not present

## 2020-09-17 DIAGNOSIS — H52223 Regular astigmatism, bilateral: Secondary | ICD-10-CM | POA: Diagnosis not present

## 2020-09-17 DIAGNOSIS — H259 Unspecified age-related cataract: Secondary | ICD-10-CM | POA: Diagnosis not present

## 2020-09-17 DIAGNOSIS — Z135 Encounter for screening for eye and ear disorders: Secondary | ICD-10-CM | POA: Diagnosis not present

## 2020-12-09 ENCOUNTER — Encounter: Payer: Self-pay | Admitting: Physician Assistant

## 2020-12-09 DIAGNOSIS — N951 Menopausal and female climacteric states: Secondary | ICD-10-CM

## 2020-12-09 NOTE — Telephone Encounter (Signed)
They can get their second boosters and can be scheduled for those in the office. Please send me a refill request for the Estrogen and I will send a refill.

## 2020-12-10 MED ORDER — EST ESTROGENS-METHYLTEST HS 0.625-1.25 MG PO TABS: ORAL_TABLET | ORAL | 5 refills | Status: DC

## 2020-12-28 DIAGNOSIS — Z1231 Encounter for screening mammogram for malignant neoplasm of breast: Secondary | ICD-10-CM | POA: Diagnosis not present

## 2020-12-28 LAB — HM MAMMOGRAPHY

## 2021-01-04 DIAGNOSIS — K862 Cyst of pancreas: Secondary | ICD-10-CM | POA: Diagnosis not present

## 2021-01-04 DIAGNOSIS — N6019 Diffuse cystic mastopathy of unspecified breast: Secondary | ICD-10-CM | POA: Diagnosis not present

## 2021-01-05 ENCOUNTER — Ambulatory Visit: Payer: Medicare HMO

## 2021-01-21 DIAGNOSIS — D2262 Melanocytic nevi of left upper limb, including shoulder: Secondary | ICD-10-CM | POA: Diagnosis not present

## 2021-01-21 DIAGNOSIS — D2261 Melanocytic nevi of right upper limb, including shoulder: Secondary | ICD-10-CM | POA: Diagnosis not present

## 2021-01-21 DIAGNOSIS — D225 Melanocytic nevi of trunk: Secondary | ICD-10-CM | POA: Diagnosis not present

## 2021-01-21 DIAGNOSIS — L718 Other rosacea: Secondary | ICD-10-CM | POA: Diagnosis not present

## 2021-01-21 DIAGNOSIS — D2271 Melanocytic nevi of right lower limb, including hip: Secondary | ICD-10-CM | POA: Diagnosis not present

## 2021-01-21 DIAGNOSIS — Z85828 Personal history of other malignant neoplasm of skin: Secondary | ICD-10-CM | POA: Diagnosis not present

## 2021-01-25 ENCOUNTER — Encounter: Payer: Self-pay | Admitting: Family Medicine

## 2021-02-02 DIAGNOSIS — E039 Hypothyroidism, unspecified: Secondary | ICD-10-CM | POA: Diagnosis not present

## 2021-02-09 DIAGNOSIS — E039 Hypothyroidism, unspecified: Secondary | ICD-10-CM | POA: Diagnosis not present

## 2021-02-09 DIAGNOSIS — E669 Obesity, unspecified: Secondary | ICD-10-CM | POA: Diagnosis not present

## 2021-04-06 ENCOUNTER — Telehealth: Payer: Self-pay

## 2021-04-06 ENCOUNTER — Other Ambulatory Visit: Payer: Self-pay

## 2021-04-06 ENCOUNTER — Ambulatory Visit (INDEPENDENT_AMBULATORY_CARE_PROVIDER_SITE_OTHER): Payer: Medicare HMO | Admitting: Nurse Practitioner

## 2021-04-06 ENCOUNTER — Encounter: Payer: Self-pay | Admitting: Nurse Practitioner

## 2021-04-06 VITALS — BP 137/78 | HR 65 | Temp 98.0°F | Wt 185.0 lb

## 2021-04-06 DIAGNOSIS — R8281 Pyuria: Secondary | ICD-10-CM

## 2021-04-06 DIAGNOSIS — R399 Unspecified symptoms and signs involving the genitourinary system: Secondary | ICD-10-CM | POA: Insufficient documentation

## 2021-04-06 DIAGNOSIS — J4521 Mild intermittent asthma with (acute) exacerbation: Secondary | ICD-10-CM | POA: Diagnosis not present

## 2021-04-06 LAB — MICROSCOPIC EXAMINATION
Bacteria, UA: NONE SEEN
WBC, UA: NONE SEEN /hpf (ref 0–5)

## 2021-04-06 LAB — URINALYSIS, ROUTINE W REFLEX MICROSCOPIC
Bilirubin, UA: NEGATIVE
Glucose, UA: NEGATIVE
Leukocytes,UA: NEGATIVE
Nitrite, UA: NEGATIVE
Protein,UA: NEGATIVE
Specific Gravity, UA: 1.015 (ref 1.005–1.030)
Urobilinogen, Ur: 0.2 mg/dL (ref 0.2–1.0)
pH, UA: 6.5 (ref 5.0–7.5)

## 2021-04-06 MED ORDER — PREDNISONE 20 MG PO TABS: 40.0000 mg | ORAL_TABLET | Freq: Every day | ORAL | 0 refills | Status: AC

## 2021-04-06 NOTE — Assessment & Plan Note (Signed)
Chronic, ongoing with increased symptoms recently.  Have recommended she take her Symbicort as ordered 2 puffs BID as maintenance inhaler and explained this to her, with good rinsing of mouth after each use.  Use Combivent only as needed.  Will send in Prednisone 40 MG daily x 5 days for mild exacerbation present.  Recommend use of Coricidin as needed for cough.  Follow-up with her PCP in 2 weeks for lung check.

## 2021-04-06 NOTE — Patient Instructions (Signed)
Asthma, Adult Asthma is a long-term (chronic) condition in which the airways get tight and narrow. The airways are the breathing passages that lead from the nose and mouth down into the lungs. A person with asthma will have times when symptoms get worse. These are called asthma attacks. They can cause coughing, whistling sounds when you breathe (wheezing), shortness of breath, and chest pain. They can make it hard to breathe. There is no cure for asthma, but medicines and lifestyle changes can help control it. There are many things that can bring on an asthma attack or make asthma symptoms worse (triggers). Common triggers include: Mold. Dust. Cigarette smoke. Cockroaches. Things that can cause allergy symptoms (allergens). These include animal skin flakes (dander) and pollen from trees or grass. Things that pollute the air. These may include household cleaners, wood smoke, smog, or chemical odors. Cold air, weather changes, and wind. Crying or laughing hard. Stress. Certain medicines or drugs. Certain foods such as dried fruit, potato chips, and grape juice. Infections, such as a cold or the flu. Certain medical conditions or diseases. Exercise or tiring activities. Asthma may be treated with medicines and by staying away from the things that cause asthma attacks. Types of medicines may include: Controller medicines. These help prevent asthma symptoms. They are usually taken every day. Fast-acting reliever or rescue medicines. These quickly relieve asthma symptoms. They are used as needed and provide short-term relief. Allergy medicines if your attacks are brought on by allergens. Medicines to help control the body's defense (immune) system. Follow these instructions at home: Avoiding triggers in your home Change your heating and air conditioning filter often. Limit your use of fireplaces and wood stoves. Get rid of pests (such as roaches and mice) and their droppings. Throw away plants  if you see mold on them. Clean your floors. Dust regularly. Use cleaning products that do not smell. Have someone vacuum when you are not home. Use a vacuum cleaner with a HEPA filter if possible. Replace carpet with wood, tile, or vinyl flooring. Carpet can trap animal skin flakes and dust. Use allergy-proof pillows, mattress covers, and box spring covers. Wash bed sheets and blankets every week in hot water. Dry them in a dryer. Keep your bedroom free of any triggers. Avoid pets and keep windows closed when things that cause allergy symptoms are in the air. Use blankets that are made of polyester or cotton. Clean bathrooms and kitchens with bleach. If possible, have someone repaint the walls in these rooms with mold-resistant paint. Keep out of the rooms that are being cleaned and painted. Wash your hands often with soap and water. If soap and water are not available, use hand sanitizer. Do not allow anyone to smoke in your home. General instructions Take over-the-counter and prescription medicines only as told by your doctor. Talk with your doctor if you have questions about how or when to take your medicines. Make note if you need to use your medicines more often than usual. Do not use any products that contain nicotine or tobacco, such as cigarettes and e-cigarettes. If you need help quitting, ask your doctor. Stay away from secondhand smoke. Avoid doing things outdoors when allergen counts are high and when air quality is low. Wear a ski mask when doing outdoor activities in the winter. The mask should cover your nose and mouth. Exercise indoors on cold days if you can. Warm up before you exercise. Take time to cool down after exercise. Use a peak flow meter as  told by your doctor. A peak flow meter is a tool that measures how well the lungs are working. Keep track of the peak flow meter's readings. Write them down. Follow your asthma action plan. This is a written plan for taking care  of your asthma and treating your attacks. Make sure you get all the shots (vaccines) that your doctor recommends. Ask your doctor about a flu shot and a pneumonia shot. Keep all follow-up visits as told by your doctor. This is important. Contact a doctor if: You have wheezing, shortness of breath, or a cough even while taking medicine to prevent attacks. The mucus you cough up (sputum) is thicker than usual. The mucus you cough up changes from clear or white to yellow, green, gray, or bloody. You have problems from the medicine you are taking, such as: A rash. Itching. Swelling. Trouble breathing. You need reliever medicines more than 2-3 times a week. Your peak flow reading is still at 50-79% of your personal best after following the action plan for 1 hour. You have a fever. Get help right away if: You seem to be worse and are not responding to medicine during an asthma attack. You are short of breath even at rest. You get short of breath when doing very little activity. You have trouble eating, drinking, or talking. You have chest pain or tightness. You have a fast heartbeat. Your lips or fingernails start to turn blue. You are light-headed or dizzy, or you faint. Your peak flow is less than 50% of your personal best. You feel too tired to breathe normally. Summary Asthma is a long-term (chronic) condition in which the airways get tight and narrow. An asthma attack can make it hard to breathe. Asthma cannot be cured, but medicines and lifestyle changes can help control it. Make sure you understand how to avoid triggers and how and when to use your medicines. This information is not intended to replace advice given to you by your health care provider. Make sure you discuss any questions you have with your health care provider. Document Revised: 11/05/2019 Document Reviewed: 11/05/2019 Elsevier Patient Education  2022 [employer].

## 2021-04-06 NOTE — Assessment & Plan Note (Signed)
Acute with UA noting 1 + BLD and trace ketones. Will send for culture to further assess if infection present, at this time no treatment sent in and discussed at length with patient.  Recommend she continue increased hydration at home and follow-up with her PCP in 2 weeks for recheck of urine due to hematuria.  ?recent kidney stone, although no further pain present.

## 2021-04-06 NOTE — Telephone Encounter (Signed)
Copied from [place] [id number]. Topic: Appointment Scheduling - Scheduling Inquiry for Clinic >> Apr 06, 2021  8:26 AM Valere Dross wrote: Reason for CRM: Pt called in stating she believes she has a UTI and wanted to be seen today if possible, please advise.

## 2021-04-06 NOTE — Progress Notes (Signed)
BP 137/78   Pulse 65   Temp 98 F (36.7 C) (Oral)   Wt 185 lb (83.9 kg)   SpO2 98%   BMI 32.77 kg/m    Subjective:    Patient ID: Meredith Scott, female    DOB: 04-07-1946, 75 y.o.   MRN: [id number]  HPI: Meredith Scott is a 75 y.o. female  Chief Complaint  Patient presents with   Urinary Tract Infection    Patient states she has not felt herself for the last 2 days and states it is weird and the last time she felt like this was back in January. Patient states she woke up last night lower back pain and discomfort in her lower abdomen area. Patient states she has had a lot of urinary frequency lately. Patient denies having burning with urination and states back in January she did not have the burning with urination and she tested positive for a UTI.    URINARY SYMPTOMS She has felt bad the past 2 days == had some back pain and lower abdomen pain last night + felt a little clammy.  Pain has improved today. Dysuria: no Urinary frequency: yes Urgency: no Small volume voids: no Symptom severity: yes Urinary incontinence: no Foul odor: no Hematuria: no Abdominal pain: no Back pain: yes Suprapubic pain/pressure: yes Flank pain: no Fever:  no Vomiting: no Status: stable Previous urinary tract infection: yes Recurrent urinary tract infection: no Sexual activity: No sexually active History of sexually transmitted disease: no Treatments attempted: increasing fluids    ASTHMA Has Symbicort 2 puffs BID, but only using this as needed.  Also has Combivent to use as needed.  Has felt a bit more tight this week with the ragweed.  Non smoker. Asthma status: exacerbated Satisfied with current treatment?: yes Albuterol/rescue inhaler frequency: every day this week Dyspnea frequency: none Wheezing frequency: occasional Cough frequency: a little more this week, but improving Nocturnal symptom frequency: none Limitation of activity: no Current upper respiratory symptoms:  no Triggers: Ragweed Home peak flows: none Last Spirometry: unknown Failed/intolerant to following asthma meds: none Asthma meds in past: Accolate Aerochamber/spacer use: no Visits to ER or Urgent Care in past year: no Pneumovax: Up to Date Influenza: Not up to Date   Relevant past medical, surgical, family and social history reviewed and updated as indicated. Interim medical history since our last visit reviewed. Allergies and medications reviewed and updated.  Review of Systems  Constitutional:  Negative for activity change, appetite change, diaphoresis, fatigue and fever.  Respiratory:  Positive for cough and wheezing. Negative for chest tightness and shortness of breath.   Cardiovascular:  Negative for chest pain, palpitations and leg swelling.  Gastrointestinal:  Positive for abdominal pain. Negative for abdominal distention and vomiting.  Genitourinary:  Positive for dysuria and frequency. Negative for hematuria and urgency.  Musculoskeletal:  Positive for back pain.  Neurological: Negative.   Psychiatric/Behavioral: Negative.     Per HPI unless specifically indicated above     Objective:    BP 137/78   Pulse 65   Temp 98 F (36.7 C) (Oral)   Wt 185 lb (83.9 kg)   SpO2 98%   BMI 32.77 kg/m   Wt Readings from Last 3 Encounters:  04/06/21 185 lb (83.9 kg)  09/10/20 185 lb (83.9 kg)  08/20/20 181 lb (82.1 kg)    Physical Exam Vitals and nursing note reviewed.  Constitutional:      General: She is awake. She is not in  acute distress.    Appearance: She is well-developed and well-groomed. She is obese. She is not ill-appearing or toxic-appearing.  HENT:     Head: Normocephalic.     Right Ear: Hearing normal.     Left Ear: Hearing normal.  Eyes:     General: Lids are normal.        Right eye: No discharge.        Left eye: No discharge.     Conjunctiva/sclera: Conjunctivae normal.     Pupils: Pupils are equal, round, and reactive to light.  Neck:      Vascular: No carotid bruit.  Cardiovascular:     Rate and Rhythm: Normal rate and regular rhythm.     Heart sounds: Normal heart sounds. No murmur heard.   No gallop.  Pulmonary:     Effort: Pulmonary effort is normal. No accessory muscle usage or respiratory distress.     Breath sounds: Wheezing present. No decreased breath sounds or rhonchi.     Comments: Occasional intermittent expiratory wheezes noted throughout. Abdominal:     General: Bowel sounds are normal. There is no distension.     Palpations: Abdomen is soft.     Tenderness: There is no abdominal tenderness. There is no right CVA tenderness or left CVA tenderness.  Musculoskeletal:     Cervical back: Normal range of motion and neck supple.     Right lower leg: No edema.     Left lower leg: No edema.  Lymphadenopathy:     Cervical: No cervical adenopathy.  Skin:    General: Skin is warm and dry.  Neurological:     Mental Status: She is alert and oriented to person, place, and time.  Psychiatric:        Attention and Perception: Attention normal.        Mood and Affect: Mood normal.        Behavior: Behavior normal. Behavior is cooperative.        Thought Content: Thought content normal.        Judgment: Judgment normal.   Results for orders placed or performed in visit on 09/10/20  Urine Culture   Specimen: Urine   Urine  Result Value Ref Range   Urine Culture, Routine Final report    Organism ID, Bacteria Comment   Urinalysis, microscopic only  Result Value Ref Range   WBC, UA None seen 0 - 5 /hpf   RBC 0-2 0 - 2 /hpf   Epithelial Cells (non renal) None seen 0 - 10 /hpf   Casts None seen None seen /lpf   Bacteria, UA None seen None seen/Few  POCT urinalysis dipstick  Result Value Ref Range   Color, UA yellow    Clarity, UA clear    Glucose, UA Negative Negative   Bilirubin, UA negative    Ketones, UA negative    Spec Grav, UA 1.020 1.010 - 1.025   Blood, UA hemolyzed moderate    pH, UA 6.5 5.0 - 8.0    Protein, UA Negative Negative   Urobilinogen, UA 0.2 0.2 or 1.0 E.U./dL   Nitrite, UA negative    Leukocytes, UA Negative Negative      Assessment & Plan:   Problem List Items Addressed This Visit       Respiratory   Asthma - Primary    Chronic, ongoing with increased symptoms recently.  Have recommended she take her Symbicort as ordered 2 puffs BID as maintenance inhaler and explained this to her,  with good rinsing of mouth after each use.  Use Combivent only as needed.  Will send in Prednisone 40 MG daily x 5 days for mild exacerbation present.  Recommend use of Coricidin as needed for cough.  Follow-up with her PCP in 2 weeks for lung check.      Relevant Medications   predniSONE (DELTASONE) 20 MG tablet     Other   Urinary symptom or sign    Acute with UA noting 1 + BLD and trace ketones. Will send for culture to further assess if infection present, at this time no treatment sent in and discussed at length with patient.  Recommend she continue increased hydration at home and follow-up with her PCP in 2 weeks for recheck of urine due to hematuria.  ?recent kidney stone, although no further pain present.      Relevant Orders   Urinalysis, Routine w reflex microscopic   Other Visit Diagnoses     Pyuria       Urine sent for culture.   Relevant Orders   Urine Culture        Follow up plan: Return in about 2 weeks (around 04/20/2021) for With PCP at [facility].

## 2021-04-08 ENCOUNTER — Encounter: Payer: Self-pay | Admitting: Family Medicine

## 2021-04-10 ENCOUNTER — Encounter: Payer: Self-pay | Admitting: Family Medicine

## 2021-04-10 LAB — URINE CULTURE

## 2021-04-10 NOTE — Progress Notes (Signed)
Contacted via [place] afternoon Lashya, your urine culture has returned and does not look like an infection is present.  Would continue ensuring you drink plenty of water and if return of symptoms ensure to see your primary care provider.:)

## 2021-04-21 ENCOUNTER — Encounter: Payer: Self-pay | Admitting: Family Medicine

## 2021-04-21 ENCOUNTER — Ambulatory Visit (INDEPENDENT_AMBULATORY_CARE_PROVIDER_SITE_OTHER): Payer: Medicare HMO | Admitting: Family Medicine

## 2021-04-21 ENCOUNTER — Other Ambulatory Visit: Payer: Self-pay

## 2021-04-21 VITALS — BP 138/58 | HR 69 | Temp 97.9°F | Resp 16 | Wt 185.9 lb

## 2021-04-21 DIAGNOSIS — K5904 Chronic idiopathic constipation: Secondary | ICD-10-CM | POA: Diagnosis not present

## 2021-04-21 DIAGNOSIS — R399 Unspecified symptoms and signs involving the genitourinary system: Secondary | ICD-10-CM

## 2021-04-21 DIAGNOSIS — J4541 Moderate persistent asthma with (acute) exacerbation: Secondary | ICD-10-CM

## 2021-04-21 DIAGNOSIS — Z23 Encounter for immunization: Secondary | ICD-10-CM | POA: Diagnosis not present

## 2021-04-21 LAB — POCT URINALYSIS DIPSTICK
Bilirubin, UA: NEGATIVE
Glucose, UA: NEGATIVE
Ketones, UA: NEGATIVE
Leukocytes, UA: NEGATIVE
Nitrite, UA: NEGATIVE
Protein, UA: NEGATIVE
Spec Grav, UA: 1.015 (ref 1.010–1.025)
Urobilinogen, UA: 0.2 E.U./dL
pH, UA: 6 (ref 5.0–8.0)

## 2021-04-21 MED ORDER — ZAFIRLUKAST 20 MG PO TABS
20.0000 mg | ORAL_TABLET | Freq: Two times a day (BID) | ORAL | 1 refills | Status: DC
Start: 2021-04-21 — End: 2024-03-14

## 2021-04-21 NOTE — Assessment & Plan Note (Signed)
Report of constipation Has to sit and strain Report of sitting with glass of water in order to assist in BM moving Has been using bene fiber Suggest increase activity as tolerated add miralax or similar with added fluids

## 2021-04-21 NOTE — Progress Notes (Signed)
Established patient visit   Patient: Meredith Scott   DOB: 06/20/1946   75 y.o. Female  MRN: [id number] Visit Date: 04/21/2021  Today's healthcare provider: Gwyneth Sprout, FNP   Chief Complaint  Patient presents with   Asthma   Hematuria    Patient returns to office for two week follow up.   Subjective    HPI HPI     Hematuria    Additional comments: Patient returns to office for two week follow up.      Last edited by Minette Headland, CMA on 04/21/2021  9:56 AM.      Follow up for Asthma  The patient was last seen for this 2 weeks ago. Changes made at last visit include starting patient of prednisone 40mg  and advised to take Combivent PRN and Symbicort.  She reports good compliance with treatment. She feels that condition is Unchanged.Patient denies shortness of breath but states that she has been wheezing more due to season changing She is not having side effects.   -----------------------------------------------------------------------------------------  Follow up for Urinary Symptoms/Sign  The patient was last seen for this 2 weeks ago. Changes made at last visit include urine sent for culture, advised increase hydration She feels that condition is Unchanged.  Will repeat with additional culture today given increase in blood in urine sampling  -----------------------------------------------------------------------------------------   Medications: Outpatient Medications Prior to Visit  Medication Sig   Ascorbic Acid (VITAMIN C PO) Take 500 mg by mouth daily.    ascorbic acid (VITAMIN C) 500 MG tablet Take by mouth.    aspirin 81 MG tablet Take 81 mg by mouth daily.   budesonide-formoterol (SYMBICORT) 160-4.5 MCG/ACT inhaler Inhale 2 puffs into the lungs 2 (two) times daily.   calcium-vitamin D (OSCAL WITH D) 500-200 MG-UNIT tablet Take 1 tablet by mouth daily with breakfast.    Cholecalciferol 25 MCG (1000 UT) tablet Take by mouth.    ciprofloxacin (CIPRO) 500 MG tablet Take 1 tablet (500 mg total) by mouth 2 (two) times daily.   COMBIVENT RESPIMAT 20-100 MCG/ACT AERS respimat USE 1 PUFF EVERY 4 HOURS AS NEEDED FOR WHEEZING   ergocalciferol (VITAMIN D2) 1.25 MG (50000 UT) capsule Take by mouth.   escitalopram (LEXAPRO) 10 MG tablet Take 0.5 tablets (5 mg total) by mouth daily.   estrogen-methylTESTOSTERone (EST ESTROGENS-METHYLTEST HS) 0.625-1.25 MG tablet TAKE 1 TABLET BY MOUTH EVERY 6 DAYS   Fish Oil OIL by Does not apply route daily.    levothyroxine (SYNTHROID, LEVOTHROID) 25 MCG tablet Take 25 mcg by mouth daily before breakfast. 2 on Sunday and Thursday   metroNIDAZOLE (METROCREAM) 0.75 % cream APP EXT TO FACE ONCE DAILY UTD   PFIZER-BIONT COVID-19 VAC-TRIS SUSP injection    Wheat Dextrin (BENEFIBER PO) Take by mouth daily. Or as EOD   WHEAT DEXTRIN PO Take by mouth.   No facility-administered medications prior to visit.    Review of Systems     Objective    BP (!) 138/58   Pulse 69   Temp 97.9 F (36.6 C) (Oral)   Wt 185 lb 14.4 oz (84.3 kg)   SpO2 (!) 16%   BMI 32.93 kg/m  {Show previous vital signs (optional):23777}  Physical Exam Vitals and nursing note reviewed.  Constitutional:      General: She is not in acute distress.    Appearance: Normal appearance. She is obese. She is not ill-appearing, toxic-appearing or diaphoretic.  HENT:     Head: Normocephalic  and atraumatic.     Nose: Nose normal.     Mouth/Throat:     Comments: Irritation per pt report s/p morning inhaler use- typical as it 'stirs things up' Cardiovascular:     Rate and Rhythm: Normal rate and regular rhythm.     Pulses: Normal pulses.     Heart sounds: Normal heart sounds. No murmur heard.   No friction rub. No gallop.  Pulmonary:     Effort: Pulmonary effort is normal. No respiratory distress.     Breath sounds: Normal breath sounds. No stridor. No wheezing, rhonchi or rales.  Chest:     Chest wall: No tenderness.   Abdominal:     General: Bowel sounds are normal. There is no distension.     Palpations: Abdomen is soft.     Tenderness: There is no abdominal tenderness. There is no right CVA tenderness, left CVA tenderness, guarding or rebound.  Musculoskeletal:        General: No swelling, tenderness, deformity or signs of injury. Normal range of motion.     Cervical back: Normal range of motion and neck supple. No tenderness.     Right lower leg: No edema.     Left lower leg: No edema.  Skin:    General: Skin is warm and dry.     Capillary Refill: Capillary refill takes less than 2 seconds.     Coloration: Skin is not jaundiced or pale.     Findings: No bruising, erythema, lesion or rash.  Neurological:     General: No focal deficit present.     Mental Status: She is alert and oriented to person, place, and time. Mental status is at baseline.     Cranial Nerves: No cranial nerve deficit.     Sensory: No sensory deficit.     Motor: No weakness.     Coordination: Coordination normal.  Psychiatric:        Mood and Affect: Mood normal.        Behavior: Behavior normal.        Thought Content: Thought content normal.        Judgment: Judgment normal.     Results for orders placed or performed in visit on 04/21/21  POCT urinalysis dipstick  Result Value Ref Range   Color, UA yellow    Clarity, UA clear    Glucose, UA Negative Negative   Bilirubin, UA negative    Ketones, UA negative    Spec Grav, UA 1.015 1.010 - 1.025   Blood, UA large    pH, UA 6.0 5.0 - 8.0   Protein, UA Negative Negative   Urobilinogen, UA 0.2 0.2 or 1.0 E.U./dL   Nitrite, UA negative    Leukocytes, UA Negative Negative   Appearance     Odor      Assessment & Plan     Problem List Items Addressed This Visit       Respiratory   Asthma    LCTAB Has finished prednisone Denies concerns Request for oral accolate       Relevant Medications   zafirlukast (ACCOLATE) 20 MG tablet     Digestive   Chronic  idiopathic constipation    Report of constipation Has to sit and strain Report of sitting with glass of water in order to assist in BM moving Has been using bene fiber Suggest increase activity as tolerated add miralax or similar with added fluids        Other   Urinary  symptom or sign - Primary    Ongoing hematuria Will repeat culture Has previous had scope- will continue to monitor Pt wondering if BM constipation concern is causing blood in her urine- refused exam/denies hemorrhoids       Relevant Medications   zafirlukast (ACCOLATE) 20 MG tablet   Other Relevant Orders   POCT urinalysis dipstick (Completed)   Flu vaccine need    Provided today Denies allergic concerns      Relevant Orders   Flu Vaccine QUAD High Dose(Fluad) (Completed)     Return for annual examination.      Vonna Kotyk, FNP, have reviewed all documentation for this visit. The documentation on 04/21/21 for the exam, diagnosis, procedures, and orders are all accurate and complete.    Gwyneth Sprout, [place] [phone] (phone) [phone] (fax)  [place]

## 2021-04-21 NOTE — Assessment & Plan Note (Addendum)
Ongoing hematuria Will repeat culture Has previous had scope- will continue to monitor Pt wondering if BM constipation concern is causing blood in her urine- refused exam/denies hemorrhoids

## 2021-04-21 NOTE — Assessment & Plan Note (Signed)
LCTAB Has finished prednisone Denies concerns Request for oral accolate

## 2021-04-21 NOTE — Assessment & Plan Note (Signed)
Provided today Denies allergic concerns

## 2021-04-22 ENCOUNTER — Encounter: Payer: Self-pay | Admitting: Family Medicine

## 2021-04-25 ENCOUNTER — Telehealth: Payer: Self-pay

## 2021-04-25 NOTE — Telephone Encounter (Signed)
Copied from [place] [id number]. Topic: General - Inquiry >> Apr 25, 2021  8:49 AM Loma Boston wrote: Reason for CRM: Pt called confirming wellness AWV with nurse is 9:40 for Nov, pt says has a printout that says 8:40. Verified to pt it is 9:40. Pt wanted to make sure health nurse had correct time at 9:40 [phone]

## 2021-05-03 ENCOUNTER — Encounter: Payer: Self-pay | Admitting: Family Medicine

## 2021-06-01 ENCOUNTER — Ambulatory Visit (INDEPENDENT_AMBULATORY_CARE_PROVIDER_SITE_OTHER): Payer: Medicare HMO

## 2021-06-01 ENCOUNTER — Other Ambulatory Visit: Payer: Self-pay

## 2021-06-01 DIAGNOSIS — Z0000 Encounter for general adult medical examination without abnormal findings: Secondary | ICD-10-CM

## 2021-06-01 NOTE — Patient Instructions (Signed)
Meredith Scott , Thank you for taking time to come for your Medicare Wellness Visit. I appreciate your ongoing commitment to your health goals. Please review the following plan we discussed and let me know if I can assist you in the future.   Screening recommendations/referrals: Colonoscopy: 02/13/20 Mammogram: 12/28/20  Bone Density: 10/25/20 Recommended yearly ophthalmology/optometry visit for glaucoma screening and checkup Recommended yearly dental visit for hygiene and checkup  Vaccinations: Influenza vaccine: 04/21/21 Pneumococcal vaccine: 05/22/2014 Tdap vaccine: 04/21/2011 Shingles vaccine: 10/25/17, 01/30/18   Covid-19:09/01/19, 09/22/19, 06/03/20, 05/18/21  Advanced directives: yes, requested a copy for the chart   Conditions/risks identified:   Next appointment: Follow up in one year for your annual wellness visit    Preventive Care 65 Years and Older, Female Preventive care refers to lifestyle choices and visits with your health care provider that can promote health and wellness. What does preventive care include? A yearly physical exam. This is also called an annual well check. Dental exams once or twice a year. Routine eye exams. Ask your health care provider how often you should have your eyes checked. Personal lifestyle choices, including: Daily care of your teeth and gums. Regular physical activity. Eating a healthy diet. Avoiding tobacco and drug use. Limiting alcohol use. Practicing safe sex. Taking low-dose aspirin every day. Taking vitamin and mineral supplements as recommended by your health care provider. What happens during an annual well check? The services and screenings done by your health care provider during your annual well check will depend on your age, overall health, lifestyle risk factors, and family history of disease. Counseling  Your health care provider may ask you questions about your: Alcohol use. Tobacco use. Drug use. Emotional  well-being. Home and relationship well-being. Sexual activity. Eating habits. History of falls. Memory and ability to understand (cognition). Work and work Statistician. Reproductive health. Screening  You may have the following tests or measurements: Height, weight, and BMI. Blood pressure. Lipid and cholesterol levels. These may be checked every 5 years, or more frequently if you are over 71 years old. Skin check. Lung cancer screening. You may have this screening every year starting at age 73 if you have a 30-pack-year history of smoking and currently smoke or have quit within the past 15 years. Fecal occult blood test (FOBT) of the stool. You may have this test every year starting at age 31. Flexible sigmoidoscopy or colonoscopy. You may have a sigmoidoscopy every 5 years or a colonoscopy every 10 years starting at age 37. Hepatitis C blood test. Hepatitis B blood test. Sexually transmitted disease (STD) testing. Diabetes screening. This is done by checking your blood sugar (glucose) after you have not eaten for a while (fasting). You may have this done every 1-3 years. Bone density scan. This is done to screen for osteoporosis. You may have this done starting at age 32. Mammogram. This may be done every 1-2 years. Talk to your health care provider about how often you should have regular mammograms. Talk with your health care provider about your test results, treatment options, and if necessary, the need for more tests. Vaccines  Your health care provider may recommend certain vaccines, such as: Influenza vaccine. This is recommended every year. Tetanus, diphtheria, and acellular pertussis (Tdap, Td) vaccine. You may need a Td booster every 10 years. Zoster vaccine. You may need this after age 16. Pneumococcal 13-valent conjugate (PCV13) vaccine. One dose is recommended after age 46. Pneumococcal polysaccharide (PPSV23) vaccine. One dose is recommended after age 3.  Talk to your  health care provider about which screenings and vaccines you need and how often you need them. This information is not intended to replace advice given to you by your health care provider. Make sure you discuss any questions you have with your health care provider. Document Released: 07/30/2015 Document Revised: 03/22/2016 Document Reviewed: 05/04/2015 Elsevier Interactive Patient Education  2017 [employer] Prevention in the Home Falls can cause injuries. They can happen to people of all ages. There are many things you can do to make your home safe and to help prevent falls. What can I do on the outside of my home? Regularly fix the edges of walkways and driveways and fix any cracks. Remove anything that might make you trip as you walk through a door, such as a raised step or threshold. Trim any bushes or trees on the path to your home. Use bright outdoor lighting. Clear any walking paths of anything that might make someone trip, such as rocks or tools. Regularly check to see if handrails are loose or broken. Make sure that both sides of any steps have handrails. Any raised decks and porches should have guardrails on the edges. Have any leaves, snow, or ice cleared regularly. Use sand or salt on walking paths during winter. Clean up any spills in your garage right away. This includes oil or grease spills. What can I do in the bathroom? Use night lights. Install grab bars by the toilet and in the tub and shower. Do not use towel bars as grab bars. Use non-skid mats or decals in the tub or shower. If you need to sit down in the shower, use a plastic, non-slip stool. Keep the floor dry. Clean up any water that spills on the floor as soon as it happens. Remove soap buildup in the tub or shower regularly. Attach bath mats securely with double-sided non-slip rug tape. Do not have throw rugs and other things on the floor that can make you trip. What can I do in the bedroom? Use night  lights. Make sure that you have a light by your bed that is easy to reach. Do not use any sheets or blankets that are too big for your bed. They should not hang down onto the floor. Have a firm chair that has side arms. You can use this for support while you get dressed. Do not have throw rugs and other things on the floor that can make you trip. What can I do in the kitchen? Clean up any spills right away. Avoid walking on wet floors. Keep items that you use a lot in easy-to-reach places. If you need to reach something above you, use a strong step stool that has a grab bar. Keep electrical cords out of the way. Do not use floor polish or wax that makes floors slippery. If you must use wax, use non-skid floor wax. Do not have throw rugs and other things on the floor that can make you trip. What can I do with my stairs? Do not leave any items on the stairs. Make sure that there are handrails on both sides of the stairs and use them. Fix handrails that are broken or loose. Make sure that handrails are as long as the stairways. Check any carpeting to make sure that it is firmly attached to the stairs. Fix any carpet that is loose or worn. Avoid having throw rugs at the top or bottom of the stairs. If you do have throw  rugs, attach them to the floor with carpet tape. Make sure that you have a light switch at the top of the stairs and the bottom of the stairs. If you do not have them, ask someone to add them for you. What else can I do to help prevent falls? Wear shoes that: Do not have high heels. Have rubber bottoms. Are comfortable and fit you well. Are closed at the toe. Do not wear sandals. If you use a stepladder: Make sure that it is fully opened. Do not climb a closed stepladder. Make sure that both sides of the stepladder are locked into place. Ask someone to hold it for you, if possible. Clearly mark and make sure that you can see: Any grab bars or handrails. First and last  steps. Where the edge of each step is. Use tools that help you move around (mobility aids) if they are needed. These include: Canes. Walkers. Scooters. Crutches. Turn on the lights when you go into a dark area. Replace any light bulbs as soon as they burn out. Set up your furniture so you have a clear path. Avoid moving your furniture around. If any of your floors are uneven, fix them. If there are any pets around you, be aware of where they are. Review your medicines with your doctor. Some medicines can make you feel dizzy. This can increase your chance of falling. Ask your doctor what other things that you can do to help prevent falls. This information is not intended to replace advice given to you by your health care provider. Make sure you discuss any questions you have with your health care provider. Document Released: 04/29/2009 Document Revised: 12/09/2015 Document Reviewed: 08/07/2014 Elsevier Interactive Patient Education  2017 [employer].

## 2021-06-01 NOTE — Progress Notes (Addendum)
ho Virtual Visit via Telephone Note  I connected with  Meredith Scott on 06/01/21 at  9:40 AM EST by telephone and verified that I am speaking with the correct person using two identifiers.  Location: Patient: home  Provider: [facility] Persons participating in the virtual visit: [place]   I discussed the limitations, risks, security and privacy concerns of performing an evaluation and management service by telephone and the availability of in person appointments. The patient expressed understanding and agreed to proceed.  Interactive audio and video telecommunications were attempted between this nurse and patient, however failed, due to patient having technical difficulties OR patient did not have access to video capability.  We continued and completed visit with audio only.  Some vital signs may be absent or patient reported.   Meredith David, LPN   Subjective:   Meredith Scott is a 75 y.o. female who presents for Medicare Annual (Subsequent) preventive examination.  Review of Systems     Cardiac Risk Factors include: advanced age (>26men, >18 women)     Objective:    Today's Vitals   06/01/21 0937  PainSc: 0-No pain   There is no height or weight on file to calculate BMI.  Advanced Directives 06/01/2021 05/26/2020 02/13/2020 05/19/2019 05/15/2018 09/05/2016 05/28/2015  Does Patient Have a Medical Advance Directive? Yes Yes Yes Yes Yes Yes Yes  Type of Paramedic of [place];Living will [place];Living will [place];Living will [place];Living will Healthcare Power of [place];Living will  Does patient want to make changes to medical advance directive? No - Patient declined - - - - - -  Copy of [place] in Chart? No - copy requested No - copy requested No - copy requested No - copy requested No - copy  requested - -    Current Medications (verified) Outpatient Encounter Medications as of 06/01/2021  Medication Sig   Ascorbic Acid (VITAMIN C PO) Take 500 mg by mouth daily.    aspirin 81 MG tablet Take 81 mg by mouth daily.   budesonide-formoterol (SYMBICORT) 160-4.5 MCG/ACT inhaler Inhale 2 puffs into the lungs 2 (two) times daily.   calcium-vitamin D (OSCAL WITH D) 500-200 MG-UNIT tablet Take 1 tablet by mouth daily with breakfast.    Cholecalciferol 25 MCG (1000 UT) tablet Take by mouth.   ergocalciferol (VITAMIN D2) 1.25 MG (50000 UT) capsule Take by mouth.   escitalopram (LEXAPRO) 10 MG tablet Take 0.5 tablets (5 mg total) by mouth daily.   estrogen-methylTESTOSTERone (EST ESTROGENS-METHYLTEST HS) 0.625-1.25 MG tablet TAKE 1 TABLET BY MOUTH EVERY 6 DAYS   Fish Oil OIL by Does not apply route daily.    levothyroxine (SYNTHROID, LEVOTHROID) 25 MCG tablet Take 25 mcg by mouth daily before breakfast. 2 on Sunday and Thursday   Wheat Dextrin (BENEFIBER PO) Take by mouth daily. Or as EOD   zafirlukast (ACCOLATE) 20 MG tablet Take 1 tablet (20 mg total) by mouth 2 (two) times daily before a meal.   ascorbic acid (VITAMIN C) 500 MG tablet Take by mouth.    ciprofloxacin (CIPRO) 500 MG tablet Take 1 tablet (500 mg total) by mouth 2 (two) times daily. (Patient not taking: Reported on 06/01/2021)   COMBIVENT RESPIMAT 20-100 MCG/ACT AERS respimat USE 1 PUFF EVERY 4 HOURS AS NEEDED FOR WHEEZING   metroNIDAZOLE (METROCREAM) 0.75 % cream APP EXT TO FACE ONCE DAILY UTD (Patient  not taking: Reported on 06/01/2021)   PFIZER-BIONT COVID-19 VAC-TRIS SUSP injection  (Patient not taking: Reported on 06/01/2021)   WHEAT DEXTRIN PO Take by mouth.   No facility-administered encounter medications on file as of 06/01/2021.    Allergies (verified) Shellfish allergy, Sulfa antibiotics, and Neosporin [neomycin-bacitracin zn-polymyx]   History: Past Medical History:  Diagnosis Date   Anxiety    Asthma     Asymptomatic varicose veins 2011   Breast screening, unspecified 2013   Colon polyp    Diffuse cystic mastopathy 2013   Hernia of abdominal wall    History of hiatal hernia    Hypertension    Hypothyroidism    Dr Eddie Dibbles   Obesity, unspecified 2013   Osteoarthritis    PONV (postoperative nausea and vomiting)    Screening for obesity 2013   Skin cancer of face 2018   squamous/ left cheek   Special screening for malignant neoplasms, colon 2013   Past Surgical History:  Procedure Laterality Date   ABDOMINAL HYSTERECTOMY  1994   BREAST CYST ASPIRATION Right 1997?   breast aspiration d/t infected milk gland   BREAST MASS EXCISION Left 2011   COLONOSCOPY  2005   done by Dr. Jamal Collin   COLONOSCOPY N/A 01/27/2015   Procedure: COLONOSCOPY;  Surgeon: Christene Lye, MD;  Location: [facility] ENDOSCOPY;  Service: Endoscopy;  Laterality: N/A;   COLONOSCOPY WITH PROPOFOL N/A 02/13/2020   Procedure: COLONOSCOPY WITH PROPOFOL;  Surgeon: Robert Bellow, MD;  Location: [facility] ENDOSCOPY;  Service: Endoscopy;  Laterality: N/A;   DILATION AND CURETTAGE OF UTERUS  2007   FINE NEEDLE ASPIRATION     FOOT SURGERY Right    LIPOMA EXCISION  2010   breast bone   SKIN CANCER EXCISION Left 12/2016   under left eye, [facility]   TONGUE BIOPSY     Family History  Problem Relation Age of Onset   Liver cancer Mother    Thyroid disease Mother    Diabetes Father    Healthy Sister    Dementia Sister    Healthy Brother    Lung cancer Maternal Uncle    Arthritis Granddaughter    Lupus Granddaughter    Arthritis Granddaughter    Lupus Granddaughter    Breast cancer Other    Social History   Socioeconomic History   Marital status: Married    Spouse name: Not on file   Number of children: 1   Years of education: H/S   Highest education level: High school graduate  Occupational History   Occupation: Retired  Tobacco Use   Smoking status: Never   Smokeless tobacco: Never  Brewing technologist Use: Never used  Substance and Sexual Activity   Alcohol use: No   Drug use: No   Sexual activity: Not Currently  Other Topics Concern   Not on file  Social History Narrative   Not on file   Social Determinants of Health   Financial Resource Strain: Low Risk    Difficulty of Paying Living Expenses: Not hard at all  Food Insecurity: No Food Insecurity   Worried About Charity fundraiser in the Last Year: Never true   [place] in the Last Year: Never true  Transportation Needs: No Transportation Needs   Lack of Transportation (Medical): No   Lack of Transportation (Non-Medical): No  Physical Activity: Unknown   Days of Exercise per Week: 3 days   Minutes of Exercise per Session: Not on  file  Stress: No Stress Concern Present   Feeling of Stress : Not at all  Social Connections: Moderately Integrated   Frequency of Communication with Friends and Family: More than three times a week   Frequency of Social Gatherings with Friends and Family: Once a week   Attends Religious Services: More than 4 times per year   Active Member of [employer] or Organizations: No   Attends Music therapist: Never   Marital Status: Married    Tobacco Counseling Counseling given: Not Answered   Clinical Intake:  Pre-visit preparation completed: Yes  Pain : No/denies pain Pain Score: 0-No pain     Nutritional Risks: None Diabetes: No   Interpreter Needed?: No  Information entered by :: Kirke Shaggy, LPN   Activities of Daily Living In your present state of health, do you have any difficulty performing the following activities: 06/01/2021 08/20/2020  Hearing? N N  Vision? N N  Difficulty concentrating or making decisions? N N  Walking or climbing stairs? N N  Dressing or bathing? N N  Doing errands, shopping? N N  Preparing Food and eating ? N -  Using the Toilet? N -  In the past six months, have you accidently leaked urine? N -  Do you have problems with  loss of bowel control? N -  Managing your Medications? N -  Managing your Finances? N -  Housekeeping or managing your Housekeeping? N -  Some recent data might be hidden    Patient Care Team: Bacigalupo, Dionne Bucy, MD as PCP - General (Family Medicine) Bary Castilla, Forest Gleason, MD (General Surgery) Warnell Forester, NP (Inactive) as Nurse Practitioner (Surgery) Hester Mates, OD as Referring Physician (Optometry) Pa, [facility] (Dermatology)  Indicate any recent Medical Services you may have received from other than [facility] providers in the past year (date may be approximate).     Assessment:   This is a routine wellness examination for Lee-Ann.  Hearing/Vision screen Hearing Screening - Comments:: Hearing pretty good, had hearing test 1 yr ago and MD said she wasn't ready for a hearing aid yet- Dr.Juengle Vision Screening - Comments:: Sees [facility]  Dietary issues and exercise activities discussed: Current Exercise Habits: Home exercise routine, Type of exercise: walking, Time (Minutes): 30, Frequency (Times/Week): 3, Weekly Exercise (Minutes/Week): 90, Intensity: Mild   Goals Addressed             This Visit's Progress    Weight (lb) < 200 lb (90.7 kg)       Eat healthy, exercise at least 18min 5x week       Depression Screen PHQ 2/9 Scores 06/01/2021 04/21/2021 08/20/2020 05/26/2020 06/06/2019 05/19/2019 05/15/2018  PHQ - 2 Score 0 0 1 0 0 0 0  PHQ- 9 Score 0 1 2 - 0 - -    Fall Risk Fall Risk  06/01/2021 08/20/2020 05/26/2020 05/19/2019 05/19/2019  Falls in the past year? 0 0 0 0 0  Comment - - - - -  Number falls in past yr: 0 0 0 0 0  Injury with Fall? 0 0 0 0 0  Risk for fall due to : No Fall Risks - - - -  Follow up Falls prevention discussed Falls evaluation completed - - -    FALL RISK PREVENTION PERTAINING TO THE HOME:  Any stairs in or around the home? No  If so, are there any without handrails? No  Home free of loose throw rugs in walkways, pet beds,  electrical  cords, etc? Yes  Adequate lighting in your home to reduce risk of falls? Yes   ASSISTIVE DEVICES UTILIZED TO PREVENT FALLS:  Life alert? No  Use of a cane, walker or w/c? No  Grab bars in the bathroom? No  Shower chair or bench in shower? Yes  Elevated toilet seat or a handicapped toilet? Yes   Cognitive Function: Normal cognitive status assessed by direct observation by this Nurse Health Advisor. No abnormalities found.       6CIT Screen 05/26/2020 05/19/2019 05/15/2018  What Year? 0 points 0 points 0 points  What month? 0 points 0 points 0 points  What time? 0 points 0 points 0 points  Count back from 20 0 points 0 points 0 points  Months in reverse 0 points 0 points 0 points  Repeat phrase 0 points 0 points 2 points  Total Score 0 0 2    Immunizations Immunization History  Administered Date(s) Administered   Fluad Quad(high Dose 65+) 04/08/2019, 04/14/2020, 04/21/2021   Influenza, High Dose Seasonal PF 04/17/2014, 04/26/2015, 04/22/2016, 04/19/2017, 05/04/2018   PFIZER(Purple Top)SARS-COV-2 Vaccination 09/01/2019, 09/22/2019, 06/03/2020   Pneumococcal Conjugate-13 05/22/2014   Pneumococcal Polysaccharide-23 05/20/2012   Td 03/14/2004   Tdap 04/21/2011   Zoster Recombinat (Shingrix) 10/25/2017, 01/30/2018   Zoster, Live 12/02/2012    TDAP status: Due, Education has been provided regarding the importance of this vaccine. Advised may receive this vaccine at local pharmacy or Health Dept. Aware to provide a copy of the vaccination record if obtained from local pharmacy or Health Dept. Verbalized acceptance and understanding.  Flu Vaccine status: Up to date  Pneumococcal vaccine status: Up to date  Covid-19 vaccine status: Completed vaccines  Qualifies for Shingles Vaccine? Yes   Zostavax completed No   Shingrix Completed?: Yes  Screening Tests Health Maintenance  Topic Date Due   COVID-19 Vaccine (4 - Booster for Pfizer series) 07/29/2020   MAMMOGRAM   12/25/2020   TETANUS/TDAP  04/20/2021   COLONOSCOPY (Pts 45-57yrs Insurance coverage will need to be confirmed)  02/12/2025   DEXA SCAN  08/19/2025   Pneumonia Vaccine 69+ Years old  Completed   INFLUENZA VACCINE  Completed   Hepatitis C Screening  Completed   Zoster Vaccines- Shingrix  Completed   HPV VACCINES  Aged Out    Health Maintenance  Health Maintenance Due  Topic Date Due   COVID-19 Vaccine (4 - Booster for [place] series) 07/29/2020   MAMMOGRAM  12/25/2020   TETANUS/TDAP  04/20/2021    Colorectal cancer screening: Type of screening: Colonoscopy. Completed 02/13/20. Repeat every 5 years  Mammogram status: Completed late spring. Repeat every year  Bone Density status: Completed 10/25/20. Results reflect: Bone density results: OSTEOPENIA. Repeat every 5 years.  Lung Cancer Screening: (Low Dose CT Chest recommended if Age 3-80 years, 30 pack-year currently smoking OR have quit w/in 15years.) does not qualify.    Additional Screening:  Hepatitis C Screening: does qualify; //Completed 06/03/2015 Vision Screening: Recommended annual ophthalmology exams for early detection of glaucoma and other disorders of the eye. Is the patient up to date with their annual eye exam?  Yes  Who is the provider or what is the name of the office in which the patient attends annual eye exams? [facility] Dental Screening: Recommended annual dental exams for proper oral hygiene  Community Resource Referral / Chronic Care Management: CRR required this visit?  No   CCM required this visit?  No      Plan:  I have personally reviewed and noted the following in the patient's chart:   Medical and social history Use of alcohol, tobacco or illicit drugs  Current medications and supplements including opioid prescriptions.  Functional ability and status Nutritional status Physical activity Advanced directives List of other physicians Hospitalizations, surgeries, and ER visits in  previous 12 months Vitals Screenings to include cognitive, depression, and falls Referrals and appointments  In addition, I have reviewed and discussed with patient certain preventive protocols, quality metrics, and best practice recommendations. A written personalized care plan for preventive services as well as general preventive health recommendations were provided to patient.     Meredith David, LPN   [id number]   Nurse Notes: none

## 2021-06-20 ENCOUNTER — Encounter: Payer: Medicare HMO | Admitting: Physician Assistant

## 2021-06-20 ENCOUNTER — Other Ambulatory Visit: Payer: Self-pay

## 2021-06-20 ENCOUNTER — Ambulatory Visit (INDEPENDENT_AMBULATORY_CARE_PROVIDER_SITE_OTHER): Payer: Medicare HMO | Admitting: Family Medicine

## 2021-06-20 ENCOUNTER — Encounter: Payer: Self-pay | Admitting: Family Medicine

## 2021-06-20 VITALS — BP 132/72 | HR 61 | Temp 97.6°F | Resp 16 | Wt 187.4 lb

## 2021-06-20 DIAGNOSIS — G8929 Other chronic pain: Secondary | ICD-10-CM | POA: Diagnosis not present

## 2021-06-20 DIAGNOSIS — E039 Hypothyroidism, unspecified: Secondary | ICD-10-CM | POA: Diagnosis not present

## 2021-06-20 DIAGNOSIS — E7800 Pure hypercholesterolemia, unspecified: Secondary | ICD-10-CM

## 2021-06-20 DIAGNOSIS — M25511 Pain in right shoulder: Secondary | ICD-10-CM | POA: Diagnosis not present

## 2021-06-20 DIAGNOSIS — Z0000 Encounter for general adult medical examination without abnormal findings: Secondary | ICD-10-CM

## 2021-06-20 DIAGNOSIS — E559 Vitamin D deficiency, unspecified: Secondary | ICD-10-CM

## 2021-06-20 DIAGNOSIS — R739 Hyperglycemia, unspecified: Secondary | ICD-10-CM

## 2021-06-20 DIAGNOSIS — M62838 Other muscle spasm: Secondary | ICD-10-CM | POA: Diagnosis not present

## 2021-06-20 DIAGNOSIS — R8290 Unspecified abnormal findings in urine: Secondary | ICD-10-CM

## 2021-06-20 NOTE — Assessment & Plan Note (Signed)
F/b endocrinology Recheck TSH No changes to medications

## 2021-06-20 NOTE — Assessment & Plan Note (Signed)
Reviewed last lipid panel Not currently on a statin Recheck FLP and CMP Discussed diet and exercise  

## 2021-06-20 NOTE — Progress Notes (Signed)
Complete physical exam   Patient: Meredith Scott   DOB: Jun 17, 1946   75 y.o. Female  MRN: [id number] Visit Date: 06/20/2021  Today's healthcare provider: Lavon Paganini, MD   Chief Complaint  Patient presents with   Annual Exam   Subjective    Meredith Scott is a 75 y.o. female who presents today for a complete physical exam.  She reports consuming a general diet. The patient does not participate in regular exercise at present. She generally feels well. She reports sleeping well. She does not have additional problems to discuss today.  HPI   Since last PCP left, she was diagnosed with OA by Dr Posey Pr[facility] at [facility]. Not on any meds. Had trouble with R shoulder in 2016 and saw Ortho. Did better with PT. Starting to flare up again. Tightness in R trap up to neck. Getting regular massage. Waxing and waning  She phone notes regarding urine   Past Medical History:  Diagnosis Date   Anxiety    Asthma    Asymptomatic varicose veins 2011   Breast screening, unspecified 2013   Colon polyp    Diffuse cystic mastopathy 2013   Hernia of abdominal wall    History of hiatal hernia    Hypertension    Hypothyroidism    Dr Eddie Dibbles   Obesity, unspecified 2013   Osteoarthritis    PONV (postoperative nausea and vomiting)    Screening for obesity 2013   Skin cancer of face 2018   squamous/ left cheek   Special screening for malignant neoplasms, colon 2013   Past Surgical History:  Procedure Laterality Date   ABDOMINAL HYSTERECTOMY  1994   BREAST CYST ASPIRATION Right 1997?   breast aspiration d/t infected milk gland   BREAST MASS EXCISION Left 2011   COLONOSCOPY  2005   done by Dr. Jamal Collin   COLONOSCOPY N/A 01/27/2015   Procedure: COLONOSCOPY;  Surgeon: Christene Lye, MD;  Location: [facility] ENDOSCOPY;  Service: Endoscopy;  Laterality: N/A;   COLONOSCOPY WITH PROPOFOL N/A 02/13/2020   Procedure: COLONOSCOPY WITH PROPOFOL;  Surgeon: Robert Bellow, MD;  Location: [facility]  ENDOSCOPY;  Service: Endoscopy;  Laterality: N/A;   DILATION AND CURETTAGE OF UTERUS  2007   FINE NEEDLE ASPIRATION     FOOT SURGERY Right    LIPOMA EXCISION  2010   breast bone   SKIN CANCER EXCISION Left 12/2016   under left eye, [facility]   TONGUE BIOPSY     Social History   Socioeconomic History   Marital status: Married    Spouse name: Not on file   Number of children: 1   Years of education: H/S   Highest education level: High school graduate  Occupational History   Occupation: Retired  Tobacco Use   Smoking status: Never   Smokeless tobacco: Never  Vaping Use   Vaping Use: Never used  Substance and Sexual Activity   Alcohol use: No   Drug use: No   Sexual activity: Not Currently  Other Topics Concern   Not on file  Social History Narrative   Not on file   Social Determinants of Health   Financial Resource Strain: Low Risk    Difficulty of Paying Living Expenses: Not hard at all  Food Insecurity: No Food Insecurity   Worried About Charity fundraiser in the Last Year: Never true   [place] in the Last Year: Never true  Transportation Needs: No Transportation Needs  Lack of Transportation (Medical): No   Lack of Transportation (Non-Medical): No  Physical Activity: Unknown   Days of Exercise per Week: 3 days   Minutes of Exercise per Session: Not on file  Stress: No Stress Concern Present   Feeling of Stress : Not at all  Social Connections: Moderately Integrated   Frequency of Communication with Friends and Family: More than three times a week   Frequency of Social Gatherings with Friends and Family: Once a week   Attends Religious Services: More than 4 times per year   Active Member of [employer] or Organizations: No   Attends Archivist Meetings: Never   Marital Status: Married  Human resources officer Violence: Not At Risk   Fear of Current or Ex-Partner: No   Emotionally Abused: No   Physically Abused: No   Sexually Abused: No    Family Status  Relation Name Status   Mother  Deceased at age 36   Father  Deceased   Sister  Alive   Brother  Alive   Daughter  Alive       Had a kidney aneursym 8/20   Administrator  (Not Specified)   G Daughter  Alive   G Daughter  Alive   Other Paternal Cousin Alive   Family History  Problem Relation Age of Onset   Liver cancer Mother    Thyroid disease Mother    Diabetes Father    Healthy Sister    Dementia Sister    Healthy Brother    Lung cancer Maternal Uncle    Arthritis Granddaughter    Lupus Granddaughter    Arthritis Granddaughter    Lupus Granddaughter    Breast cancer Other    Allergies  Allergen Reactions   Shellfish Allergy Other (See Comments)    Throat closes   Sulfa Antibiotics Swelling   Neosporin [Neomycin-Bacitracin Zn-Polymyx] Rash    Patient Care Team: [name], PA-C as PCP - General (Physician Assistant) Bary Castilla, Forest Gleason, MD (General Surgery) Warnell Forester, NP (Inactive) as Nurse Practitioner (Surgery) Hester Mates, OD as Referring Physician (Optometry) Pa, [facility] (Dermatology)   Medications: Outpatient Medications Prior to Visit  Medication Sig   Ascorbic Acid (VITAMIN C PO) Take 500 mg by mouth daily.    ascorbic acid (VITAMIN C) 500 MG tablet Take by mouth.    aspirin 81 MG tablet Take 81 mg by mouth daily.   budesonide-formoterol (SYMBICORT) 160-4.5 MCG/ACT inhaler Inhale 2 puffs into the lungs 2 (two) times daily.   calcium-vitamin D (OSCAL WITH D) 500-200 MG-UNIT tablet Take 1 tablet by mouth daily with breakfast.    Cholecalciferol (VITAMIN D3) 50 MCG (2000 UT) CAPS Take 1 capsule by mouth daily.   COMBIVENT RESPIMAT 20-100 MCG/ACT AERS respimat USE 1 PUFF EVERY 4 HOURS AS NEEDED FOR WHEEZING   escitalopram (LEXAPRO) 10 MG tablet Take 0.5 tablets (5 mg total) by mouth daily.   estrogen-methylTESTOSTERone (EST ESTROGENS-METHYLTEST HS) 0.625-1.25 MG tablet TAKE 1 TABLET BY MOUTH EVERY 6 DAYS   Fish Oil OIL  by Does not apply route daily.    levothyroxine (SYNTHROID, LEVOTHROID) 25 MCG tablet Take 25 mcg by mouth daily before breakfast. 2 on Sunday and Thursday   metroNIDAZOLE (METROCREAM) 0.75 % cream    PFIZER-BIONT COVID-19 VAC-TRIS SUSP injection    Wheat Dextrin (BENEFIBER PO) Take by mouth daily. Or as EOD   WHEAT DEXTRIN PO Take by mouth.   zafirlukast (ACCOLATE) 20 MG tablet Take 1 tablet (20 mg total)  by mouth 2 (two) times daily before a meal.   [DISCONTINUED] Cholecalciferol 25 MCG (1000 UT) tablet Take by mouth. (Patient not taking: Reported on 06/20/2021)   [DISCONTINUED] ciprofloxacin (CIPRO) 500 MG tablet Take 1 tablet (500 mg total) by mouth 2 (two) times daily. (Patient not taking: Reported on 06/20/2021)   [DISCONTINUED] ergocalciferol (VITAMIN D2) 1.25 MG (50000 UT) capsule Take by mouth. (Patient not taking: Reported on 06/20/2021)   No facility-administered medications prior to visit.    Review of Systems  Musculoskeletal:  Positive for arthralgias and neck stiffness.  All other systems reviewed and are negative.  Last CBC Lab Results  Component Value Date   WBC 6.5 06/14/2020   HGB 14.2 06/14/2020   HCT 41.3 06/14/2020   MCV 93 06/14/2020   MCH 31.8 06/14/2020   RDW 11.9 06/14/2020   PLT 273 15/40/0867   Last metabolic panel Lab Results  Component Value Date   GLUCOSE 102 (H) 06/14/2020   NA 142 06/14/2020   K 4.7 06/14/2020   CL 103 06/14/2020   CO2 24 06/14/2020   BUN 12 06/14/2020   CREATININE 0.89 06/14/2020   GFRNONAA 65 06/14/2020   CALCIUM 9.5 06/14/2020   PROT 6.6 06/14/2020   ALBUMIN 4.4 06/14/2020   LABGLOB 2.2 06/14/2020   AGRATIO 2.0 06/14/2020   BILITOT 0.5 06/14/2020   ALKPHOS 77 06/14/2020   AST 20 06/14/2020   ALT 16 06/14/2020   Last lipids Lab Results  Component Value Date   CHOL 212 (H) 06/14/2020   HDL 54 06/14/2020   LDLCALC 145 (H) 06/14/2020   TRIG 74 06/14/2020   CHOLHDL 3.7 06/06/2019   Last hemoglobin A1c Lab Results   Component Value Date   HGBA1C 5.6 06/14/2020   Last thyroid functions Lab Results  Component Value Date   TSH 1.200 06/14/2020      Objective    BP 132/72   Pulse 61   Temp 97.6 F (36.4 C) (Temporal)   Resp 16   Wt 187 lb 6.4 oz (85 kg)   SpO2 98%   BMI 33.20 kg/m  BP Readings from Last 3 Encounters:  06/20/21 132/72  04/21/21 (!) 138/58  04/06/21 137/78   Wt Readings from Last 3 Encounters:  06/20/21 187 lb 6.4 oz (85 kg)  04/21/21 185 lb 14.4 oz (84.3 kg)  04/06/21 185 lb (83.9 kg)      Physical Exam Vitals reviewed.  Constitutional:      General: She is not in acute distress.    Appearance: Normal appearance. She is well-developed. She is not diaphoretic.  HENT:     Head: Normocephalic and atraumatic.     Right Ear: Tympanic membrane, ear canal and external ear normal.     Left Ear: Tympanic membrane, ear canal and external ear normal.     Nose: Nose normal.     Mouth/Throat:     Mouth: Mucous membranes are moist.     Pharynx: Oropharynx is clear. No oropharyngeal exudate.  Eyes:     General: No scleral icterus.    Conjunctiva/sclera: Conjunctivae normal.     Pupils: Pupils are equal, round, and reactive to light.  Neck:     Thyroid: No thyromegaly.  Cardiovascular:     Rate and Rhythm: Normal rate and regular rhythm.     Pulses: Normal pulses.     Heart sounds: Normal heart sounds. No murmur heard. Pulmonary:     Effort: Pulmonary effort is normal. No respiratory distress.  Breath sounds: Normal breath sounds. No wheezing or rales.  Chest:     Comments: Breasts: breasts appear normal, no suspicious masses, no skin or nipple changes or axillary nodes  Abdominal:     General: There is no distension.     Palpations: Abdomen is soft.     Tenderness: There is no abdominal tenderness.  Musculoskeletal:        General: No deformity.     Cervical back: Neck supple.     Right lower leg: No edema.     Left lower leg: No edema.     Comments: R  Shoulder: Inspection reveals no abnormalities, atrophy or asymmetry. Palpation is normal with no tenderness over AC joint or bicipital groove. ROM is full in all planes. Rotator cuff strength normal throughout. No signs of impingement with negative Neer and Hawkin's tests, empty can. Tightness of Trapezius  Lymphadenopathy:     Cervical: No cervical adenopathy.  Skin:    General: Skin is warm and dry.     Findings: No rash.  Neurological:     Mental Status: She is alert and oriented to person, place, and time. Mental status is at baseline.     Sensory: No sensory deficit.     Motor: No weakness.     Gait: Gait normal.  Psychiatric:        Mood and Affect: Mood normal.        Behavior: Behavior normal.        Thought Content: Thought content normal.      Last depression screening scores PHQ 2/9 Scores 06/01/2021 04/21/2021 08/20/2020  PHQ - 2 Score 0 0 1  PHQ- 9 Score 0 1 2   Last fall risk screening Fall Risk  06/01/2021  Falls in the past year? 0  Comment -  Number falls in past yr: 0  Injury with Fall? 0  Risk for fall due to : No Fall Risks  Follow up Falls prevention discussed   Last Audit-C alcohol use screening Alcohol Use Disorder Test (AUDIT) 06/01/2021  1. How often do you have a drink containing alcohol? 0  2. How many drinks containing alcohol do you have on a typical day when you are drinking? 0  3. How often do you have six or more drinks on one occasion? 0  AUDIT-C Score 0  Alcohol Brief Interventions/Follow-up -   A score of 3 or more in women, and 4 or more in men indicates increased risk for alcohol abuse, EXCEPT if all of the points are from question 1   Results for orders placed or performed in visit on 06/20/21  HM MAMMOGRAPHY  Result Value Ref Range   HM Mammogram 0-4 Bi-Rad 0-4 Bi-Rad, Self Reported Normal    Assessment & Plan    Routine Health Maintenance and Physical Exam  Exercise Activities and Dietary recommendations  Goals      DIET  - REDUCE PORTION SIZE     Continue TOPS and current diet plan of eating 3 small meals a day with two healthy snack in between.      Weight (lb) < 200 lb (90.7 kg)     Eat healthy, exercise at least 44min 5x week        Immunization History  Administered Date(s) Administered   Fluad Quad(high Dose 65+) 04/08/2019, 04/14/2020, 04/21/2021   Influenza, High Dose Seasonal PF 04/17/2014, 04/26/2015, 04/22/2016, 04/19/2017, 05/04/2018   PFIZER(Purple Top)SARS-COV-2 Vaccination 09/01/2019, 09/22/2019, 06/03/2020   Pneumococcal Conjugate-13 05/22/2014   Pneumococcal  Polysaccharide-23 05/20/2012   Td 03/14/2004   Tdap 04/21/2011   Zoster Recombinat (Shingrix) 10/25/2017, 01/30/2018   Zoster, Live 12/02/2012    Health Maintenance  Topic Date Due   COVID-19 Vaccine (4 - Booster for Pfizer series) 07/29/2020   TETANUS/TDAP  04/20/2021   MAMMOGRAM  12/28/2021   COLONOSCOPY (Pts 45-42yrs Insurance coverage will need to be confirmed)  02/12/2025   DEXA SCAN  08/19/2025   Pneumonia Vaccine 72+ Years old  Completed   INFLUENZA VACCINE  Completed   Hepatitis C Screening  Completed   Zoster Vaccines- Shingrix  Completed   HPV VACCINES  Aged Out    Discussed health benefits of physical activity, and encouraged her to engage in regular exercise appropriate for her age and condition.  Problem List Items Addressed This Visit       Endocrine   Acquired hypothyroidism    F/b endocrinology Recheck TSH No changes to medications      Relevant Orders   TSH     Other   Elevated LDL cholesterol level    Reviewed last lipid panel Not currently on a statin Recheck FLP and CMP Discussed diet and exercise       Relevant Orders   Lipid panel   Comprehensive metabolic panel   Chronic right shoulder pain    Ongoing for many months Likely 2/2 OA and trapezius spasm Rotator cuff strength intact No impingement Continue massage, heat, occasional NSAIDs Referral to PT      Relevant Orders    Ambulatory referral to Physical Therapy   Other Visit Diagnoses     Encounter for annual physical exam    -  Primary   Relevant Orders   TSH   Lipid panel   Comprehensive metabolic panel   Hemoglobin A1c   VITAMIN D 25 Hydroxy (Vit-D Deficiency, Fractures)   Hyperglycemia       Relevant Orders   Hemoglobin A1c   Avitaminosis D       Relevant Orders   VITAMIN D 25 Hydroxy (Vit-D Deficiency, Fractures)   Trapezius muscle spasm       Relevant Orders   Ambulatory referral to Physical Therapy   Abnormal urine       Relevant Orders   Urine Culture        Return in about 1 year (around 06/20/2022) for CPE, AWV.     I, Lavon Paganini, MD, have reviewed all documentation for this visit. The documentation on 06/20/21 for the exam, diagnosis, procedures, and orders are all accurate and complete.   Londen Bok, Dionne Bucy, MD, MPH [facility]

## 2021-06-20 NOTE — Patient Instructions (Signed)
Ask at the pharmacy about the tetanus shot

## 2021-06-20 NOTE — Assessment & Plan Note (Signed)
Ongoing for many months Likely 2/2 OA and trapezius spasm Rotator cuff strength intact No impingement Continue massage, heat, occasional NSAIDs Referral to PT

## 2021-06-22 ENCOUNTER — Encounter: Payer: Self-pay | Admitting: Family Medicine

## 2021-06-22 LAB — URINE CULTURE

## 2021-06-28 DIAGNOSIS — Z0000 Encounter for general adult medical examination without abnormal findings: Secondary | ICD-10-CM | POA: Diagnosis not present

## 2021-06-28 DIAGNOSIS — R739 Hyperglycemia, unspecified: Secondary | ICD-10-CM | POA: Diagnosis not present

## 2021-06-28 DIAGNOSIS — E7800 Pure hypercholesterolemia, unspecified: Secondary | ICD-10-CM | POA: Diagnosis not present

## 2021-06-28 DIAGNOSIS — E559 Vitamin D deficiency, unspecified: Secondary | ICD-10-CM | POA: Diagnosis not present

## 2021-06-28 DIAGNOSIS — E039 Hypothyroidism, unspecified: Secondary | ICD-10-CM | POA: Diagnosis not present

## 2021-06-29 LAB — COMPREHENSIVE METABOLIC PANEL
ALT: 15 IU/L (ref 0–32)
AST: 17 IU/L (ref 0–40)
Albumin/Globulin Ratio: 1.9 (ref 1.2–2.2)
Albumin: 4.4 g/dL (ref 3.7–4.7)
Alkaline Phosphatase: 82 IU/L (ref 44–121)
BUN/Creatinine Ratio: 17 (ref 12–28)
BUN: 15 mg/dL (ref 8–27)
Bilirubin Total: 0.6 mg/dL (ref 0.0–1.2)
CO2: 27 mmol/L (ref 20–29)
Calcium: 9.6 mg/dL (ref 8.7–10.3)
Chloride: 103 mmol/L (ref 96–106)
Creatinine, Ser: 0.87 mg/dL (ref 0.57–1.00)
Globulin, Total: 2.3 g/dL (ref 1.5–4.5)
Glucose: 109 mg/dL — ABNORMAL HIGH (ref 70–99)
Potassium: 4.2 mmol/L (ref 3.5–5.2)
Sodium: 143 mmol/L (ref 134–144)
Total Protein: 6.7 g/dL (ref 6.0–8.5)
eGFR: 70 mL/min/{1.73_m2} (ref >59)

## 2021-06-29 LAB — LIPID PANEL
Chol/HDL Ratio: 3.5 ratio (ref 0.0–4.4)
Cholesterol, Total: 194 mg/dL (ref 100–199)
HDL: 55 mg/dL (ref >39)
LDL Chol Calc (NIH): 125 mg/dL — ABNORMAL HIGH (ref 0–99)
Triglycerides: 75 mg/dL (ref 0–149)
VLDL Cholesterol Cal: 14 mg/dL (ref 5–40)

## 2021-06-29 LAB — TSH: TSH: 2.91 u[IU]/mL (ref 0.450–4.500)

## 2021-06-29 LAB — VITAMIN D 25 HYDROXY (VIT D DEFICIENCY, FRACTURES): Vit D, 25-Hydroxy: 38.7 ng/mL (ref 30.0–100.0)

## 2021-06-29 LAB — HEMOGLOBIN A1C
Est. average glucose Bld gHb Est-mCnc: 117 mg/dL
Hgb A1c MFr Bld: 5.7 % — ABNORMAL HIGH (ref 4.8–5.6)

## 2021-07-20 ENCOUNTER — Encounter: Payer: Self-pay | Admitting: Physical Therapy

## 2021-07-20 ENCOUNTER — Ambulatory Visit: Payer: Medicare HMO | Attending: Family Medicine | Admitting: Physical Therapy

## 2021-07-20 DIAGNOSIS — M62838 Other muscle spasm: Secondary | ICD-10-CM | POA: Diagnosis not present

## 2021-07-20 DIAGNOSIS — M25511 Pain in right shoulder: Secondary | ICD-10-CM | POA: Diagnosis not present

## 2021-07-20 DIAGNOSIS — G8929 Other chronic pain: Secondary | ICD-10-CM | POA: Insufficient documentation

## 2021-07-20 NOTE — Therapy (Signed)
[facility] [address], [place], [address] Phone: [phone]   Fax:  [phone]  Physical Therapy Evaluation  Patient Details  Name: Meredith Scott MRN: [id number] Date of Birth: 1945-11-16 Referring Provider (PT): Brita Romp MD   Encounter Date: 07/20/2021   PT End of Session - 07/20/21 0920     Visit Number 1    Number of Visits 17    Date for PT Re-Evaluation 09/23/21    Authorization - Visit Number 1    Authorization - Number of Visits 10    PT Start Time 0905    PT Stop Time 0945    PT Time Calculation (min) 40 min    Activity Tolerance Patient tolerated treatment well    Behavior During Therapy [facility] for tasks assessed/performed             Past Medical History:  Diagnosis Date   Anxiety    Asthma    Asymptomatic varicose veins 2011   Breast screening, unspecified 2013   Colon polyp    Diffuse cystic mastopathy 2013   Hernia of abdominal wall    History of hiatal hernia    Hypertension    Hypothyroidism    Dr Eddie Dibbles   Obesity, unspecified 2013   Osteoarthritis    PONV (postoperative nausea and vomiting)    Screening for obesity 2013   Skin cancer of face 2018   squamous/ left cheek   Special screening for malignant neoplasms, colon 2013    Past Surgical History:  Procedure Laterality Date   ABDOMINAL HYSTERECTOMY  1994   BREAST CYST ASPIRATION Right 1997?   breast aspiration d/t infected milk gland   BREAST MASS EXCISION Left 2011   COLONOSCOPY  2005   done by Dr. Jamal Collin   COLONOSCOPY N/A 01/27/2015   Procedure: COLONOSCOPY;  Surgeon: Christene Lye, MD;  Location: [facility] ENDOSCOPY;  Service: Endoscopy;  Laterality: N/A;   COLONOSCOPY WITH PROPOFOL N/A 02/13/2020   Procedure: COLONOSCOPY WITH PROPOFOL;  Surgeon: Robert Bellow, MD;  Location: [facility] ENDOSCOPY;  Service: Endoscopy;  Laterality: N/A;   DILATION AND CURETTAGE OF UTERUS  2007   FINE NEEDLE ASPIRATION      FOOT SURGERY Right    LIPOMA EXCISION  2010   breast bone   SKIN CANCER EXCISION Left 12/2016   under left eye, [facility]   TONGUE BIOPSY      There were no vitals filed for this visit.    OBJECTIVE  MUSCULOSKELETAL: Tremor: Normal Bulk: Normal Tone: Normal  Cervical Screen AROM: WFL and painless with overpressure in all planes with pt reporting pulling on R side of neck with flexion R rotation: 52; L rotation 39d* R lateral flexion: 41d L Lateral flexion 20d* Spurlings A (ipsilateral lateral flexion/axial compression): R: Negative L: Negative Spurlings B (ipsilateral lateral flexion/contralateral rotation/axial compression): R: Negative L: Negative Repeated movement: No centralization or peripheralization with protraction or retraction  Elbow Screen Elbow AROM: Within Normal Limits  Palpation TTP with trigger points and concordant pain sign to palpation at L UT and levator scapula. TTP with latent trigger points to bicep muscle belly and bilat pec minor Trigger points and tension noted at L UT and levator as well (R>L) without pain   Posture: Forward head, rounded shoulders, increased thoracic kyphosis  Observation: scapular dyskensis with overhead reaching bilat R>L with increased shoulder hiking/scapular elevation  Strength R/L 5/5 Shoulder flexion (anterior deltoid/pec major/coracobrachialis, axillary n. (C5-6) and musculocutaneous  n. (C5-7)) 5/4+ Shoulder abduction (deltoid/supraspinatus, axillary/suprascapular n, C5) 5/4*Shoulder external rotation (infraspinatus/teres minor) 5/5 Shoulder internal rotation (subcapularis/lats/pec major) 5/5 Shoulder extension (posterior deltoid, lats, teres major, axillary/thoracodorsal n.) 3+/4- Y lower trap  4-/4 T Scapular retractors 4+/4+ Latissimus  AROM R/L 180/141 Shoulder flexion 180/122 Shoulder abduction CTJ/C7 Shoulder external rotation T10/T12* Shoulder internal rotation 60/60 Shoulder  extension Thoracic ext 25% limited Thoracic flex WNL Lumbothoracic rotation 10% deficit bilat Scapular motions WNL with difficulty sequencing retraction and depression  *Indicates pain, overpressure performed unless otherwise indicated  PROM WNL for all shoulder motions with pain at end range of flexion and abd PROM = AROM for thoracic and scapular mobility   Accessory Motions/Glides Glenohumeral: Posterior: R: normal L: normal Inferior: R: normal L: normal Anterior: R: normal L: normal  Acromioclavicular:  Posterior: R: normal L: normal Anterior: R: normal L: normal   NEUROLOGICAL: Sensation Grossly intact to light touch bilateral UE as determined by testing dermatomes C2-T2 Proprioception and hot/cold testing deferred on this date SPECIAL TESTS  Rotator Cuff  Drop Arm Test: Negative Painful Arc (Pain from 60 to 120 degrees scaption): Positive Infraspinatus Muscle Test: Positive If all 3 tests positive, the probability of a full-thickness rotator cuff tear is 91%  Subacromial Impingement Hawkins-Kennedy: Positive Neer (Block scapula, PROM flexion): Positive Painful Arc (Pain from 60 to 120 degrees scaption): Positive Empty Can: Positive External Rotation Resistance: Positive  Labral Tear Biceps Load II (120 elevation, full ER, 90 elbow flexion, full supination, resisted elbow flexion): Negative Crank (160 scaption, axial load with IR/ER): Negative Active Compression Test: Negative  Bicep Tendon Pathology Speed (shoulder flexion to 90, external rotation, full elbow extension, and forearm supination with resistance: Negative Yergason's (resisted shoulder ER and supination/biceps tendon pathology): Negative  Shoulder Instability Sulcus Sign: Negative Anterior Apprehension: Negative   Ther-Ex PT reviewed the following HEP with patient with patient able to demonstrate a set of the following with min cuing for correction needed. PT educated patient on parameters of  therex (how/when to inc/decrease intensity, frequency, rep/set range, stretch hold time, and purpose of therex) with verbalized understanding.   Access Code: [email or web address] Seated Cervical Sidebending Stretch - 3 x daily - 7 x weekly - 30-60sec hold Seated Levator Scapulae Stretch - 3 x daily - 7 x weekly - 30-60sec hold Seated Thoracic Lumbar Extension with Pectoralis Stretch - 12 x daily - 7 x weekly - 12 reps - 2sec hold         Subjective Assessment - 07/20/21 0908     Pertinent History Pt is a 76 year old female reporting with chronic R shoulder pain. She has had chronic R shoulder pain for several years, but reports pain was exacerbated last year with insideous onset. Reports pain is more posterior lateral, and bothers the R side of her neck as well. Reports pain is a stiff ache and comes on with aggravating activities such as reaching behind her and behind the back, vacuuming, carrying things, turning her head to the R, and overhead reaching. Lowest pain 2/10 ("it's always there"); worst 9/10; current 5/10. 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 She lives at home with her husband who helps with heavy household chores, though she is able to bath, dress, feed modI with some modifications needed for behind the back  reaching. Patient is R handed. Denies any n/t. Pt denies N/V, B&B changes, unexplained weight fluctuation, saddle paresthesia, fever, night sweats, or unrelenting night pain at this time.    Limitations Lifting;House hold activities;Sitting    How long can you sit comfortably? unlimited    How long can you stand comfortably? unlimited    How long can you walk comfortably? unlimited    Diagnostic tests None since 2016    Currently in Pain? Yes    Pain Score 5     Pain Location Shoulder    Pain  Orientation Right;Posterior;Lateral    Pain Descriptors / Indicators Aching    Pain Type Chronic pain    Pain Radiating Towards R neck    Pain Onset More than a month ago    Pain Frequency Constant    Aggravating Factors  vacuuming, sleeping on L side, turning head R, overhead reaching, behind reaching    Pain Relieving Factors tylenol, heat or ice              [facility] PT Assessment - 07/20/21 0001       Assessment   Medical Diagnosis R shoulder pain    Referring Provider (PT) Bacigalupo MD    Onset Date/Surgical Date 07/20/20    Hand Dominance Right    Next MD Visit non    Prior Therapy yes      Balance Screen   Has the patient fallen in the past 6 months No    Has the patient had a decrease in activity level because of a fear of falling?  No    Is the patient reluctant to leave their home because of a fear of falling?  No                        Objective measurements completed on examination: See above findings.                PT Education - 07/20/21 0920     Education Details Patient was educated on diagnosis, anatomy and pathology involved, prognosis, role of PT, and was given an HEP, demonstrating exercise with proper form following verbal and tactile cues, and was given a paper hand out to continue exercise at home. Pt was educated on and agreed to plan of care.    Person(s) Educated Patient    Methods Explanation;Demonstration;Verbal cues;Handout    Comprehension Verbalized understanding;Returned demonstration;Verbal cues required              PT Short Term Goals - 07/20/21 1525       PT SHORT TERM GOAL #1   Title Pt will be independent with HEP in order to improve strength and decrease pain in order to improve pain-free function at home and work.    Baseline 07/20/21 HEP given    Time 4    Period Weeks    Status New               PT Long Term Goals - 07/20/21 1525       PT LONG TERM GOAL #1   Title Pt will decrease  worst pain as reported on NPRS by at least 3 points in order to demonstrate clinically significant reduction in pain.    Baseline 08/20/21 9/10    Time 8    Period Weeks    Status New      PT LONG TERM GOAL #2   Title Pt will increase gross periscapular strength to  at least 4/5 MMT grade bilat in order to demonstrate improvement in strength and function    Baseline 07/20/21 R/L T scap retractors 4-/4; Y lower trap 3+/41    Time 8    Period Weeks    Status New      PT LONG TERM GOAL #3   Title Pt will demonstrate full active shoulder ROM in order to complete household ADLs and self care ADLs    Baseline 07/20/21 flex 141d; abd 122d; ER C7; IR T 12    Time 8    Period Weeks    Status New      PT LONG TERM GOAL #4   Title Patient will increase FOTO score to 64 to demonstrate predicted increase in functional mobility to complete ADLs    Baseline 07/20/21 54    Time 8    Period Weeks    Status New                    Plan - 07/20/21 1541     Clinical Impression Statement Pt is a 76 year old female presenting with chronic R shoulder pain, with recent flare up over the past year. Signs and symptoms of subacromial impingement with infraspinatus (possible supraspinatus) involvement. Impairments in decreased shoulder ROM, decreased motor control (scapular dyskensis, increased scapular elevation with overhead mobility), decreased periscapular strength, decreased thoracic mobility, and pain. Activity limitations in overhead reaching, behind the back reaching, pushing, pulling, lifting, and carrying; inhibiting full participation in ADLs and community activities. Would benefit from skilled PT to address above deficits and promote optimal return to PLOF.    Personal Factors and Comorbidities Comorbidity 2;Fitness;Past/Current Experience;Time since onset of injury/illness/exacerbation    Comorbidities OA, HTN , hypothyroid    Examination-Activity Limitations Reach Overhead;Lift;Carry;Bed  Mobility;Dressing;Hygiene/Grooming    Examination-Participation Restrictions Cleaning;Community Activity;Occupation;Meal Prep    Stability/Clinical Decision Making Evolving/Moderate complexity    Clinical Decision Making Moderate    Rehab Potential Good    PT Frequency 2x / week    PT Duration 8 weeks    PT Treatment/Interventions ADLs/Self Care Home Management;Cryotherapy;Electrical Stimulation;Moist Heat;Traction;Ultrasound;DME Instruction;Aquatic Therapy;Fluidtherapy;Therapeutic exercise;Functional mobility training;Neuromuscular re-education;Manual techniques;Passive range of motion;Spinal Manipulations;Joint Manipulations;Dry needling;Taping;Splinting;Patient/family education    PT Next Visit Plan periscapular strengthening, UT/levator/pec minor lengthening, increased scapulothoracic mobility, shoulder AAROM    PT Home Exercise Plan UT and levator stretch, thoracic ext over towel with pec stretch    Consulted and Agree with Plan of Care Patient             Patient will benefit from skilled therapeutic intervention in order to improve the following deficits and impairments:  Decreased activity tolerance, Decreased endurance, Decreased range of motion, Decreased strength, Increased fascial restricitons, Impaired UE functional use, Pain, Improper body mechanics, Postural dysfunction, Impaired flexibility, Impaired tone, Decreased coordination, Decreased mobility, Decreased safety awareness, Increased muscle spasms  Visit Diagnosis: Chronic right shoulder pain     Problem List Patient Active Problem List   Diagnosis Date Noted   Chronic right shoulder pain 06/20/2021   Chronic idiopathic constipation 04/21/2021   Pelvic pressure in female 08/12/2020   Family history of systemic lupus erythematosus 06/14/2020   Family history of arthritis 06/14/2020   Osteoarthritis of knee 06/06/2019   Sprain of knee 06/06/2019   Pancreatic cyst 01/02/2019   Abnormal weight gain 07/31/2018    Incisional hernia, without obstruction or gangrene 04/17/2018   Other constipation 12/26/2017   History of colonic polyps 12/26/2017   Diverticulosis 12/24/2017   Elevated  LDL cholesterol level 05/28/2015   Acid reflux 04/17/2015   Acquired hypothyroidism 04/17/2015   Arthritis 04/17/2015   Family history of diabetes mellitus 04/17/2015   Allergic rhinitis 04/07/2015   Anxiety 04/07/2015   Arthropathy of temporomandibular joint 04/07/2015   Spasm 04/07/2015   Climacteric 04/07/2015   Adenopathy, cervical 04/07/2015   Asthma 02/02/2015   Fibrocystic breast disease 11/26/2012   Durwin Reges DPT Durwin Reges, PT 07/20/2021, 3:49 PM  [facility] [address] [place], [address] Phone: [phone]   Fax:  [phone]  Name: Meredith Scott MRN: [id number] Date of Birth: [birth year]

## 2021-07-25 ENCOUNTER — Encounter: Payer: Self-pay | Admitting: Physical Therapy

## 2021-07-25 ENCOUNTER — Ambulatory Visit: Payer: Medicare HMO | Admitting: Physical Therapy

## 2021-07-25 DIAGNOSIS — M25511 Pain in right shoulder: Secondary | ICD-10-CM | POA: Diagnosis not present

## 2021-07-25 DIAGNOSIS — M62838 Other muscle spasm: Secondary | ICD-10-CM | POA: Diagnosis not present

## 2021-07-25 DIAGNOSIS — G8929 Other chronic pain: Secondary | ICD-10-CM | POA: Diagnosis not present

## 2021-07-25 NOTE — Therapy (Signed)
[facility] [address], [place], [address] Phone: [phone]   Fax:  [phone]  Physical Therapy Treatment  Patient Details  Name: Meredith Scott MRN: [id number] Date of Birth: December 27, 1945 Referring Provider (PT): Brita Romp MD   Encounter Date: 07/25/2021   PT End of Session - 07/25/21 0910     Visit Number 2    Number of Visits 17    Date for PT Re-Evaluation 09/23/21    Authorization - Visit Number 2    Authorization - Number of Visits 10    PT Start Time 0904    PT Stop Time 0942    PT Time Calculation (min) 38 min    Activity Tolerance Patient tolerated treatment well    Behavior During Therapy [facility] for tasks assessed/performed             Past Medical History:  Diagnosis Date   Anxiety    Asthma    Asymptomatic varicose veins 2011   Breast screening, unspecified 2013   Colon polyp    Diffuse cystic mastopathy 2013   Hernia of abdominal wall    History of hiatal hernia    Hypertension    Hypothyroidism    Dr Eddie Dibbles   Obesity, unspecified 2013   Osteoarthritis    PONV (postoperative nausea and vomiting)    Screening for obesity 2013   Skin cancer of face 2018   squamous/ left cheek   Special screening for malignant neoplasms, colon 2013    Past Surgical History:  Procedure Laterality Date   ABDOMINAL HYSTERECTOMY  1994   BREAST CYST ASPIRATION Right 1997?   breast aspiration d/t infected milk gland   BREAST MASS EXCISION Left 2011   COLONOSCOPY  2005   done by Dr. Jamal Collin   COLONOSCOPY N/A 01/27/2015   Procedure: COLONOSCOPY;  Surgeon: Christene Lye, MD;  Location: [facility] END[facility]COPY;  Service: Endoscopy;  Laterality: N/A;   COLONOSCOPY WITH PROPOFOL N/A 02/13/2020   Procedure: COLONOSCOPY WITH PROPOFOL;  Surgeon: Robert Bellow, MD;  Location: [facility] END[facility]COPY;  Service: Endoscopy;  Laterality: N/A;   DILATION AND CURETTAGE OF UTERUS  2007   FINE NEEDLE ASPIRATION      FOOT SURGERY Right    LIPOMA EXCISION  2010   breast bone   SKIN CANCER EXCISION Left 12/2016   under left eye, [facility]   TONGUE BIOPSY      There were no vitals filed for this visit.   Subjective Assessment - 07/25/21 0906     Subjective Patient reports 2/10 R shoulder/neck pain today. She reports she mostly took it easy this weekend. She has been completing HEP but is somewhat confused on how often she should complete exercises. No changes to note since last session otherwise    Pertinent History Pt is a 76 year old female reporting with chronic R shoulder pain. She has had chronic R shoulder pain for several years, but reports pain was exacerbated last year with insideous onset. Reports pain is more posterior lateral, and bothers the R side of her neck as well. Reports pain is a stiff ache and comes on with aggravating activities such as reaching behind her and behind the back, vacuuming, carrying things, turning her head to the R, and overhead reaching. Lowest pain 2/10 ("it's always there"); worst 9/10; current 5/10. 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 She lives at home with her husband who helps with heavy household chores, though she is able to bath, dress, feed modI with some modifications needed for behind the back reaching. Patient is R handed. Denies any n/t. Pt denies N/V, B&B changes, unexplained weight fluctuation, saddle paresthesia, fever, night sweats, or unrelenting night pain at this time.    Limitations Lifting;House hold activities;Sitting    How long can you sit comfortably? unlimited    How long can you stand comfortably? unlimited    How long can you walk comfortably? unlimited    Diagnostic tests None since 2016    Pain Onset More than a month ago                Ther-Ex Pulleys AAROM flex and abd x12 with near full flex with this, lacking approx 30d abd (cuing needed for true abd) Seated Thoracic Lumbar Extension with Pectoralis Stretch Snow angels on wall with cuing for occiput contact and scapulohumeral rhythm x12 with difficulty preventing protraction of R shoulder, better with LUE - Modified to on mat table x12 with increased mobility approx 150-160d overhead with most focus on scapulohumeral rhythm Supine scapular punches 2x 12 with good carry over of cuing for scapular mobility  Supine DB pullovers 3# 2x 8 with cuing from scapulohumeral rhythm with good carry over Scaption in sitting 2x 12 with good carry over of supine scapulohumeral rhythm, min cuing for posture with good carry over Post shoulder rolls x15 with focus of depression + retraction UT stretch 30sec hold Levator stretch 30sec hold                         PT Education - 07/25/21 0910     Education Details therex form/therex, HEP review    Person(s) Educated Patient    Methods Explanation;Demonstration;Verbal cues    Comprehension Verbalized understanding;Returned demonstration;Verbal cues required              PT Short Term Goals - 07/20/21 1525       PT SHORT TERM GOAL #1   Title Pt will be independent with HEP in order to improve strength and decrease pain in order to improve pain-free function at home and work.    Baseline 07/20/21 HEP given    Time 4    Period Weeks    Status New               PT Long Term Goals - 07/20/21 1525       PT LONG TERM GOAL #1   Title Pt will decrease worst pain as reported on NPRS by at least 3 points in order to demonstrate clinically significant reduction in pain.    Baseline 08/20/21 9/10    Time 8    Period Weeks    Status New      PT LONG TERM GOAL #2   Title Pt will increase gross periscapular strength to at least 4/5 MMT grade bilat in order to demonstrate improvement in strength and  function    Baseline 07/20/21 R/L T scap retractors 4-/4; Y lower trap 3+/41    Time 8    Period Weeks    Status New      PT LONG TERM GOAL #3   Title Pt will demonstrate full active shoulder ROM in order to complete household ADLs and self  care ADLs    Baseline 07/20/21 flex 141d; abd 122d; ER C7; IR T 12    Time 8    Period Weeks    Status New      PT LONG TERM GOAL #4   Title Patient will increase FOTO score to 64 to demonstrate predicted increase in functional mobility to complete ADLs    Baseline 07/20/21 54    Time [place] - 07/25/21 2094     Clinical Impression Statement PT initiated therex progression for increased scapulohumeral rhythm, and increased mobility of scapulae, thoracic spine, and GHJ needed for coordination of overhead motion with success. patient is able to comply with multimodal cuing of proper technique of all therex with good motivation throughout session. Patien tis able to achieve increased overhead mobility by the end of sets of therex, as well as with better understanding of scapulohumeral rhythm for overhead motion. PT encouraged patient to continue motor control in ADLs with understanding. PT will continue progression as able.    Personal Factors and Comorbidities Comorbidity 2;Fitness;Past/Current Experience;Time since onset of injury/illness/exacerbation    Comorbidities OA, HTN , hypothyroid    Examination-Activity Limitations Reach Overhead;Lift;Carry;Bed Mobility;Dressing;Hygiene/Grooming    Examination-Participation Restrictions Cleaning;Community Activity;Occupation;Meal Prep    Stability/Clinical Decision Making Evolving/Moderate complexity    Clinical Decision Making Moderate    Rehab Potential Good    PT Duration 8 weeks    PT Treatment/Interventions ADLs/Self Care Home Management;Cryotherapy;Electrical Stimulation;Moist Heat;Traction;Ultrasound;DME Instruction;Aquatic  Therapy;Fluidtherapy;Therapeutic exercise;Functional mobility training;Neuromuscular re-education;Manual techniques;Passive range of motion;Spinal Manipulations;Joint Manipulations;Dry needling;Taping;Splinting;Patient/family education    PT Next Visit Plan periscapular strengthening, UT/levator/pec minor lengthening, increased scapulothoracic mobility, shoulder AAROM    PT Home Exercise Plan UT and levator stretch, thoracic ext over towel with pec stretch    Consulted and Agree with Plan of Care Patient             Patient will benefit from skilled therapeutic intervention in order to improve the following deficits and impairments:  Decreased activity tolerance, Decreased endurance, Decreased range of motion, Decreased strength, Increased fascial restricitons, Impaired UE functional use, Pain, Improper body mechanics, Postural dysfunction, Impaired flexibility, Impaired tone, Decreased coordination, Decreased mobility, Decreased safety awareness, Increased muscle spasms  Visit Diagnosis: Chronic right shoulder pain     Problem List Patient Active Problem List   Diagnosis Date Noted   Chronic right shoulder pain 06/20/2021   Chronic idiopathic constipation 04/21/2021   Pelvic pressure in female 08/12/2020   Family history of systemic lupus erythematosus 06/14/2020   Family history of arthritis 06/14/2020   Osteoarthritis of knee 06/06/2019   Sprain of knee 06/06/2019   Pancreatic cyst 01/02/2019   Abnormal weight gain 07/31/2018   Incisional hernia, without obstruction or gangrene 04/17/2018   Other constipation 12/26/2017   History of colonic polyps 12/26/2017   Diverticulosis 12/24/2017   Elevated LDL cholesterol level 05/28/2015   Acid reflux 04/17/2015   Acquired hypothyroidism 04/17/2015   Arthritis 04/17/2015   Family history of diabetes mellitus 04/17/2015   Allergic rhinitis 04/07/2015   Anxiety 04/07/2015   Arthropathy of temporomandibular joint 04/07/2015   Spasm  04/07/2015   Climacteric 04/07/2015   Adenopathy, cervical 04/07/2015   Asthma 02/02/2015   Fibrocystic breast disease 11/26/2012   Durwin Reges DPT Durwin Reges, PT 07/25/2021, 9:43 AM  [facility] [address], [place], [address] Phone: [phone]   Fax:  [phone]  Name: Meredith Scott MRN: [id number] Date of Birth: Jun 15, 1946

## 2021-07-27 ENCOUNTER — Ambulatory Visit: Payer: Medicare HMO | Admitting: Physical Therapy

## 2021-07-27 DIAGNOSIS — M25511 Pain in right shoulder: Secondary | ICD-10-CM | POA: Diagnosis not present

## 2021-07-27 DIAGNOSIS — G8929 Other chronic pain: Secondary | ICD-10-CM | POA: Diagnosis not present

## 2021-07-27 DIAGNOSIS — M62838 Other muscle spasm: Secondary | ICD-10-CM | POA: Diagnosis not present

## 2021-07-27 NOTE — Therapy (Signed)
[facility] [address], [place], [address] Phone: [phone]   Fax:  [phone]  Physical Therapy Treatment  Patient Details  Name: Meredith Scott MRN: [id number] Date of Birth: 21-Apr-1946 Referring Provider (PT): Brita Romp MD   Encounter Date: 07/27/2021   PT End of Session - 07/27/21 0906     Visit Number 3    Number of Visits 17    Date for PT Re-Evaluation 09/23/21    Authorization - Visit Number 3    Authorization - Number of Visits 10    PT Start Time 0900    PT Stop Time 0940    PT Time Calculation (min) 40 min    Activity Tolerance Patient tolerated treatment well    Behavior During Therapy [facility] for tasks assessed/performed             Past Medical History:  Diagnosis Date   Anxiety    Asthma    Asymptomatic varicose veins 2011   Breast screening, unspecified 2013   Colon polyp    Diffuse cystic mastopathy 2013   Hernia of abdominal wall    History of hiatal hernia    Hypertension    Hypothyroidism    Dr Eddie Dibbles   Obesity, unspecified 2013   Osteoarthritis    PONV (postoperative nausea and vomiting)    Screening for obesity 2013   Skin cancer of face 2018   squamous/ left cheek   Special screening for malignant neoplasms, colon 2013    Past Surgical History:  Procedure Laterality Date   ABDOMINAL HYSTERECTOMY  1994   BREAST CYST ASPIRATION Right 1997?   breast aspiration d/t infected milk gland   BREAST MASS EXCISION Left 2011   COLONOSCOPY  2005   done by Dr. Jamal Collin   COLONOSCOPY N/A 01/27/2015   Procedure: COLONOSCOPY;  Surgeon: Christene Lye, MD;  Location: [facility] ENDOSCOPY;  Service: Endoscopy;  Laterality: N/A;   COLONOSCOPY WITH PROPOFOL N/A 02/13/2020   Procedure: COLONOSCOPY WITH PROPOFOL;  Surgeon: Robert Bellow, MD;  Location: [facility] ENDOSCOPY;  Service: Endoscopy;  Laterality: N/A;   DILATION AND CURETTAGE OF UTERUS  2007   FINE NEEDLE ASPIRATION      FOOT SURGERY Right    LIPOMA EXCISION  2010   breast bone   SKIN CANCER EXCISION Left 12/2016   under left eye, [facility]   TONGUE BIOPSY      There were no vitals filed for this visit.   Subjective Assessment - 07/27/21 0859     Subjective Pt reports mostly stiffness in her neck this am, with some pain turning her neck. Does continue to have difficulty and pain with reaching behind her back. HEP is going well, heat is helping her pain    Pertinent History Pt is a 76 year old female reporting with chronic R shoulder pain. She has had chronic R shoulder pain for several years, but reports pain was exacerbated last year with insideous onset. Reports pain is more posterior lateral, and bothers the R side of her neck as well. Reports pain is a stiff ache and comes on with aggravating activities such as reaching behind her and behind the back, vacuuming, carrying things, turning her head to the R, and overhead reaching. Lowest pain 2/10 ("it's always there"); worst 9/10; current 5/10. 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 She lives at home with her husband who helps with heavy household chores, though she is able to bath, dress, feed modI with some modifications needed for behind the back reaching. Patient is R handed. Denies any n/t. Pt denies N/V, B&B changes, unexplained weight fluctuation, saddle paresthesia, fever, night sweats, or unrelenting night pain at this time.    Limitations Lifting;House hold activities;Sitting    How long can you sit comfortably? unlimited    How long can you stand comfortably? unlimited    How long can you walk comfortably? unlimited    Diagnostic tests None since 2016    Pain Onset More than a month ago            Ther-Ex Pulleys AAROM flex and abd x12 with near  full flex with this, lacking approx 30d abd (cuing needed for true abd)  Seated Thoracic Lumbar Extension with dowel over towel roll with concordant neck flex <> ext  Seated alt thoracolumbar rotation with tball (pink) overhead with concordant cervical rotation Seated cervical rotations with good mobility to R, 25% deficit to L  Supine cervical rotations with towel roll TC for upper cervical retraction with increased mobility x18 rotations; Carry over in sitting with slight upper cervical retraction  and PT giving overpressure at concordant occiput x12 alt rotations; without PT support x8 rotations with increased mobility approx 10% deficit of L rotation, pt reports feels "easier"  Bilat scaption standing against wall for TC of scapulohumeral rhythm with pt unable to touch L thumb to wall for initial reps- able by final reps; with RTB resistance 2x 10 with min cuing to maintain scapular depression + retraction with good carryover  Low rows scapular depression + retraction RTB 2x 10 with cuing to prevent shoulder hiking with good carry over  Post shoulder rolls x15 with focus of depression + retraction UT stretch 30sec hold Levator stretch 30sec hold with cuing for posture with good carry over          PT Education - 07/27/21 0905     Education Details therex form/technique    Person(s) Educated Patient    Methods Explanation;Demonstration;Verbal cues    Comprehension Verbalized understanding;Returned demonstration;Verbal cues required              PT Short Term Goals - 07/20/21 1525       PT SHORT TERM GOAL #1   Title Pt will be independent with HEP in order to improve strength and decrease pain in order to improve pain-free function at home and work.    Baseline 07/20/21 HEP given    Time 4    Period Weeks    Status New               PT Long Term Goals - 07/20/21 1525       PT LONG TERM GOAL #1   Title Pt will decrease worst pain as reported on NPRS by at least  3 points in order to demonstrate clinically significant reduction in pain.    Baseline 08/20/21 9/10    Time 8    Period Weeks    Status New      PT LONG TERM GOAL #2   Title Pt will increase gross periscapular strength to at least 4/5 MMT grade bilat in order to demonstrate improvement in strength and function    Baseline 07/20/21 R/L T scap retractors 4-/4; Y lower  trap 3+/41    Time 8    Period Weeks    Status New      PT LONG TERM GOAL #3   Title Pt will demonstrate full active shoulder ROM in order to complete household ADLs and self care ADLs    Baseline 07/20/21 flex 141d; abd 122d; ER C7; IR T 12    Time 8    Period Weeks    Status New      PT LONG TERM GOAL #4   Title Patient will increase FOTO score to 64 to demonstrate predicted increase in functional mobility to complete ADLs    Baseline 07/20/21 54    Time 8    Period Weeks    Status New                   Plan - 07/27/21 0930     Clinical Impression Statement PT continued therex progression for increased mobility needed for efficiency of overhead mobility and periscapular strengthening with success. Pt is able to comply with all cuing for proper technique of therex with good motivation and without pain throughout session. Patient with marked increased cervical rotation with decreased pain following exercises for motor control and sequencing. Paitent is able to demonstrate better motor control and carry over of postural cuing by end of session. PT will continue progression as able.    Personal Factors and Comorbidities Comorbidity 2;Fitness;Past/Current Experience;Time since onset of injury/illness/exacerbation    Comorbidities OA, HTN , hypothyroid    Examination-Activity Limitations Reach Overhead;Lift;Carry;Bed Mobility;Dressing;Hygiene/Grooming    Examination-Participation Restrictions Cleaning;Community Activity;Occupation;Meal Prep    Stability/Clinical Decision Making Evolving/Moderate complexity    Clinical  Decision Making Moderate    Rehab Potential Good    PT Frequency 2x / week    PT Duration 8 weeks    PT Treatment/Interventions ADLs/Self Care Home Management;Cryotherapy;Electrical Stimulation;Moist Heat;Traction;Ultrasound;DME Instruction;Aquatic Therapy;Fluidtherapy;Therapeutic exercise;Functional mobility training;Neuromuscular re-education;Manual techniques;Passive range of motion;Spinal Manipulations;Joint Manipulations;Dry needling;Taping;Splinting;Patient/family education    PT Next Visit Plan periscapular strengthening, UT/levator/pec minor lengthening, increased scapulothoracic mobility, shoulder AAROM    PT Home Exercise Plan UT and levator stretch, thoracic ext over towel with pec stretch    Consulted and Agree with Plan of Care Patient             Patient will benefit from skilled therapeutic intervention in order to improve the following deficits and impairments:  Decreased activity tolerance, Decreased endurance, Decreased range of motion, Decreased strength, Increased fascial restricitons, Impaired UE functional use, Pain, Improper body mechanics, Postural dysfunction, Impaired flexibility, Impaired tone, Decreased coordination, Decreased mobility, Decreased safety awareness, Increased muscle spasms  Visit Diagnosis: Chronic right shoulder pain     Problem List Patient Active Problem List   Diagnosis Date Noted   Chronic right shoulder pain 06/20/2021   Chronic idiopathic constipation 04/21/2021   Pelvic pressure in female 08/12/2020   Family history of systemic lupus erythematosus 06/14/2020   Family history of arthritis 06/14/2020   Osteoarthritis of knee 06/06/2019   Sprain of knee 06/06/2019   Pancreatic cyst 01/02/2019   Abnormal weight gain 07/31/2018   Incisional hernia, without obstruction or gangrene 04/17/2018   Other constipation 12/26/2017   History of colonic polyps 12/26/2017   Diverticulosis 12/24/2017   Elevated LDL cholesterol level 05/28/2015    Acid reflux 04/17/2015   Acquired hypothyroidism 04/17/2015   Arthritis 04/17/2015   Family history of diabetes mellitus 04/17/2015   Allergic rhinitis 04/07/2015   Anxiety 04/07/2015   Arthropathy of temporomandibular joint 04/07/2015  Spasm 04/07/2015   Climacteric 04/07/2015   Adenopathy, cervical 04/07/2015   Asthma 02/02/2015   Fibrocystic breast disease 11/26/2012   Meredith Scott DPT Meredith Scott, PT 07/27/2021, 10:27 AM  [facility] [address] [place], [address] Phone: [phone]   Fax:  [phone]  Name: Meredith Scott MRN: [id number] Date of Birth: 09/17/1945

## 2021-08-01 ENCOUNTER — Ambulatory Visit: Payer: Medicare HMO | Admitting: Physical Therapy

## 2021-08-02 ENCOUNTER — Telehealth: Payer: Self-pay

## 2021-08-02 DIAGNOSIS — F320 Major depressive disorder, single episode, mild: Secondary | ICD-10-CM

## 2021-08-02 MED ORDER — ESCITALOPRAM OXALATE 10 MG PO TABS: 5.0000 mg | ORAL_TABLET | Freq: Every day | ORAL | 1 refills | Status: DC

## 2021-08-02 NOTE — Telephone Encounter (Signed)
[facility] faxed refill request for the following medications:  escitalopram (LEXAPRO) 10 MG tablet   Please advise.  

## 2021-08-03 ENCOUNTER — Ambulatory Visit: Payer: Medicare HMO | Admitting: Physical Therapy

## 2021-08-03 ENCOUNTER — Other Ambulatory Visit: Payer: Self-pay | Admitting: Family Medicine

## 2021-08-03 ENCOUNTER — Encounter: Payer: Self-pay | Admitting: Physical Therapy

## 2021-08-03 DIAGNOSIS — M25511 Pain in right shoulder: Secondary | ICD-10-CM

## 2021-08-03 DIAGNOSIS — N951 Menopausal and female climacteric states: Secondary | ICD-10-CM

## 2021-08-03 DIAGNOSIS — G8929 Other chronic pain: Secondary | ICD-10-CM

## 2021-08-03 DIAGNOSIS — M62838 Other muscle spasm: Secondary | ICD-10-CM | POA: Diagnosis not present

## 2021-08-03 NOTE — Therapy (Signed)
[facility] [address], [place], [address] Phone: [phone]   Fax:  [phone]  Physical Therapy Treatment  Patient Details  Name: Meredith Scott MRN: [id number] Date of Birth: Mar 07, 1946 Referring Provider (PT): Brita Romp MD   Encounter Date: 08/03/2021   PT End of Session - 08/03/21 0952     Visit Number 4    Number of Visits 17    Date for PT Re-Evaluation 09/23/21    Authorization - Visit Number 3    Authorization - Number of Visits 10    PT Start Time 0900    PT Stop Time 0240    PT Time Calculation (min) 44 min    Activity Tolerance Patient tolerated treatment well    Behavior During Therapy [facility] for tasks assessed/performed             Past Medical History:  Diagnosis Date   Anxiety    Asthma    Asymptomatic varicose veins 2011   Breast screening, unspecified 2013   Colon polyp    Diffuse cystic mastopathy 2013   Hernia of abdominal wall    History of hiatal hernia    Hypertension    Hypothyroidism    Dr Eddie Dibbles   Obesity, unspecified 2013   Osteoarthritis    PONV (postoperative nausea and vomiting)    Screening for obesity 2013   Skin cancer of face 2018   squamous/ left cheek   Special screening for malignant neoplasms, colon 2013    Past Surgical History:  Procedure Laterality Date   ABDOMINAL HYSTERECTOMY  1994   BREAST CYST ASPIRATION Right 1997?   breast aspiration d/t infected milk gland   BREAST MASS EXCISION Left 2011   COLONOSCOPY  2005   done by Dr. Jamal Collin   COLONOSCOPY N/A 01/27/2015   Procedure: COLONOSCOPY;  Surgeon: Christene Lye, MD;  Location: [facility] ENDOSCOPY;  Service: Endoscopy;  Laterality: N/A;   COLONOSCOPY WITH PROPOFOL N/A 02/13/2020   Procedure: COLONOSCOPY WITH PROPOFOL;  Surgeon: Robert Bellow, MD;  Location: [facility] ENDOSCOPY;  Service: Endoscopy;  Laterality: N/A;   DILATION AND CURETTAGE OF UTERUS  2007   FINE NEEDLE ASPIRATION      FOOT SURGERY Right    LIPOMA EXCISION  2010   breast bone   SKIN CANCER EXCISION Left 12/2016   under left eye, [facility]   TONGUE BIOPSY      There were no vitals filed for this visit.   Subjective Assessment - 08/03/21 0900     Subjective pnt reports increased pain in arm and shoulder this weekend that diminished a little with ice. Monday the pain started to feel better. pnt reports pain this morning 7/10 on NPS.    Pertinent History Pt is a 76 year old female reporting with chronic R shoulder pain. She has had chronic R shoulder pain for several years, but reports pain was exacerbated last year with insideous onset. Reports pain is more posterior lateral, and bothers the R side of her neck as well. Reports pain is a stiff ache and comes on with aggravating activities such as reaching behind her and behind the back, vacuuming, carrying things, turning her head to the R, and overhead reaching. Lowest pain 2/10 ("it's always there"); worst 9/10; current 5/10. 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 She lives at home with her husband who helps with heavy household chores, though she is able to bath, dress, feed modI with some modifications needed for behind the back reaching. Patient is R handed. Denies any n/t. Pt denies N/V, B&B changes, unexplained weight fluctuation, saddle paresthesia, fever, night sweats, or unrelenting night pain at this time.    Limitations Lifting;House hold activities;Sitting    How long can you sit comfortably? unlimited    How long can you stand comfortably? unlimited    How long can you walk comfortably? unlimited    Diagnostic tests None since 2016    Pain Onset More than a month ago              [facility]  Sub occ release   Progressive TP release on R  UT  PNF stretch for right UT   NPS reduced to 4/10 following   Ther-Ex Pulleys AAROM flex and abd x12 with near full flex with this, lacking approx 30d abd (cuing needed for true abd)   Seated Thoracic Lumbar Extension with dowel over towel roll 3 x8 with min cueing for thoracic extension   Seated ER YTB 3 x10  with cueing to bring shoulder blades down and back   Supine DNF head nods 3 x10    Seated cervical rotations with curing for head nod 2 x8; pnt lacking 30 degrees of L cervical rotation. Curing to exhale to the right and release UT  Seated thoracic rotation with cueing to exhale upon end range rotation 3 x10; pnt demonstrated increased thoracic and cervical rotation. Pnt reports feeling able to rotate neck more                         PT Education - 08/03/21 0950     Education Details ther-ex form and breathing education    Person(s) Educated Patient    Methods Explanation;Verbal cues;Demonstration;Tactile cues    Comprehension Verbalized understanding;Returned demonstration              PT Short Term Goals - 07/20/21 1525       PT SHORT TERM GOAL #1   Title Pt will be independent with HEP in order to improve strength and decrease pain in order to improve pain-free function at home and work.    Baseline 07/20/21 HEP given    Time 4    Period Weeks    Status New               PT Long Term Goals - 07/20/21 1525       PT LONG TERM GOAL #1   Title Pt will decrease worst pain as reported on NPRS by at least 3 points in order to demonstrate clinically significant reduction in pain.    Baseline 08/20/21 9/10    Time 8    Period Weeks    Status New      PT LONG TERM GOAL #2   Title Pt will increase gross periscapular strength to at least 4/5 MMT grade bilat in order to demonstrate improvement in strength and function    Baseline 07/20/21 R/L T scap retractors 4-/4; Y lower trap 3+/41    Time 8    Period Weeks    Status New      PT LONG  TERM GOAL #3   Title Pt will demonstrate full active shoulder ROM in order to complete  household ADLs and self care ADLs    Baseline 07/20/21 flex 141d; abd 122d; ER C7; IR T 12    Time 8    Period Weeks    Status New      PT LONG TERM GOAL #4   Title Patient will increase FOTO score to 64 to demonstrate predicted increase in functional mobility to complete ADLs    Baseline 07/20/21 54    Time 8    Period Weeks    Status New                   Plan - 08/03/21 1002     Clinical Impression Statement Pnt presented with 7/10 pain on NPS in her neck and R UT. SPT performed sub occ release, progressive TP release, and PNF stretching to R UT which reduced pain to a 4/10 pain. Pnt tolerated exercise progression well with moderate verbal and tactile cueing with good motivation. Pnt demonstrated verbal understanding. Pnt cervical and thoracic rotation increased to WNL post ther-ex. Pnt requires further PT guidance to address deficits in postural muscle strength and pain in UT    Personal Factors and Comorbidities Comorbidity 2;Fitness;Past/Current Experience;Time since onset of injury/illness/exacerbation    Comorbidities OA, HTN , hypothyroid    Examination-Activity Limitations Reach Overhead;Lift;Carry;Bed Mobility;Dressing;Hygiene/Grooming    Examination-Participation Restrictions Cleaning;Community Activity;Occupation;Meal Prep    Stability/Clinical Decision Making Evolving/Moderate complexity    Rehab Potential Good    PT Frequency 2x / week    PT Duration 8 weeks    PT Treatment/Interventions ADLs/Self Care Home Management;Cryotherapy;Electrical Stimulation;Moist Heat;Traction;Ultrasound;DME Instruction;Aquatic Therapy;Fluidtherapy;Therapeutic exercise;Functional mobility training;Neuromuscular re-education;Manual techniques;Passive range of motion;Spinal Manipulations;Joint Manipulations;Dry needling;Taping;Splinting;Patient/family education    PT Next Visit Plan periscapular  strengthening, UT/levator/pec minor lengthening, increased scapulothoracic mobility, shoulder AAROM    PT Home Exercise Plan UT and levator stretch, thoracic ext over towel with pec stretch    Consulted and Agree with Plan of Care Patient             Patient will benefit from skilled therapeutic intervention in order to improve the following deficits and impairments:  Decreased activity tolerance, Decreased endurance, Decreased range of motion, Decreased strength, Increased fascial restricitons, Impaired UE functional use, Pain, Improper body mechanics, Postural dysfunction, Impaired flexibility, Impaired tone, Decreased coordination, Decreased mobility, Decreased safety awareness, Increased muscle spasms  Visit Diagnosis: Chronic right shoulder pain     Problem List Patient Active Problem List   Diagnosis Date Noted   Chronic right shoulder pain 06/20/2021   Chronic idiopathic constipation 04/21/2021   Pelvic pressure in female 08/12/2020   Family history of systemic lupus erythematosus 06/14/2020   Family history of arthritis 06/14/2020   Osteoarthritis of knee 06/06/2019   Sprain of knee 06/06/2019   Pancreatic cyst 01/02/2019   Abnormal weight gain 07/31/2018   Incisional hernia, without obstruction or gangrene 04/17/2018   Other constipation 12/26/2017   History of colonic polyps 12/26/2017   Diverticulosis 12/24/2017   Elevated LDL cholesterol level 05/28/2015   Acid reflux 04/17/2015   Acquired hypothyroidism 04/17/2015   Arthritis 04/17/2015   Family history of diabetes mellitus 04/17/2015   Allergic rhinitis 04/07/2015   Anxiety 04/07/2015   Arthropathy of temporomandibular joint 04/07/2015   Spasm 04/07/2015   Climacteric 04/07/2015   Adenopathy, cervical 04/07/2015   Asthma 02/02/2015   Fibrocystic breast disease 11/26/2012     Durwin Reges DPT Claiborne Billings O'Daniel, SPT Durwin Reges, PT 08/03/2021, 4:22 PM  [facility] [address] [place], [address] Phone: [phone]   Fax:  [phone]  Name: Meredith Scott MRN: [id number] Date of Birth: Aug 04, 1945

## 2021-08-08 ENCOUNTER — Encounter: Payer: Self-pay | Admitting: Physician Assistant

## 2021-08-08 ENCOUNTER — Ambulatory Visit: Payer: Self-pay

## 2021-08-08 ENCOUNTER — Ambulatory Visit: Payer: Medicare HMO | Admitting: Physical Therapy

## 2021-08-08 DIAGNOSIS — U071 COVID-19: Secondary | ICD-10-CM

## 2021-08-08 MED ORDER — NIRMATRELVIR/RITONAVIR (PAXLOVID)TABLET: 3.0000 | ORAL_TABLET | Freq: Two times a day (BID) | ORAL | 0 refills | Status: AC

## 2021-08-08 NOTE — Telephone Encounter (Signed)
Dr. Caryn Section had already completed this message.

## 2021-08-08 NOTE — Telephone Encounter (Signed)
Sx started Fri hoarse Yesterday worse, nose draining, headache,coughing, fever 100 Answer Assessment - Initial Assessment Questions 1. COVID-19 DIAGNOSIS: "Who made your COVID-19 diagnosis?" "Was it confirmed by a positive lab test or self-test?" If not diagnosed by a doctor (or NP/PA), ask "Are there lots of cases (community spread) where you live?" Note: See public health department website, if unsure.     Home test positive this am 2. COVID-19 EXPOSURE: "Was there any known exposure to COVID before the symptoms began?" CDC Definition of close contact: within 6 feet (2 meters) for a total of 15 minutes or more over a 24-hour period.      husband 3. ONSET: "When did the COVID-19 symptoms start?"      Friday 4. WORST SYMPTOM: "What is your worst symptom?" (e.g., cough, fever, shortness of breath, muscle aches)     Headache, fever 5. COUGH: "Do you have a cough?" If Yes, ask: "How bad is the cough?"       Yes sporadic 6. FEVER: "Do you have a fever?" If Yes, ask: "What is your temperature, how was it measured, and when did it start?"     Yes 100 slightly over 7. RESPIRATORY STATUS: "Describe your breathing?" (e.g., shortness of breath, wheezing, unable to speak)      Denies breathing difficulty 8. BETTER-SAME-WORSE: "Are you getting better, staying the same or getting worse compared to yesterday?"  If getting worse, ask, "In what way?"     Worse -nose drainage, fever, coughing, headache 9. HIGH RISK DISEASE: "Do you have any chronic medical problems?" (e.g., asthma, heart or lung disease, weak immune system, obesity, etc.)     *No Answer* 10. VACCINE: "Have you had the COVID-19 vaccine?" If Yes, ask: "Which one, how many shots, when did you get it?"       3 11. BOOSTER: "Have you received your COVID-19 booster?" If Yes, ask: "Which one and when did you get it?"       2 12. PREGNANCY: "Is there any chance you are pregnant?" "When was your last menstrual period?"       N/a 13. OTHER SYMPTOMS:  "Do you have any other symptoms?"  (e.g., chills, fatigue, headache, loss of smell or taste, muscle pain, sore throat)       headache 14. O2 SATURATION MONITOR:  "Do you use an oxygen saturation monitor (pulse oximeter) at home?" If Yes, ask "What is your reading (oxygen level) today?" "What is your usual oxygen saturation reading?" (e.g., 95%)       *No Answer*  Protocols used: Coronavirus (COVID-19) Diagnosed or Suspected-A-AH

## 2021-08-08 NOTE — Telephone Encounter (Signed)
°  Chief Complaint: covid + wants Paxlovid Symptoms: cough runny nose, fever, headache Frequency: since Friday Pertinent Negatives: Patient denies sore throat, SOB Disposition: [] ED /[] Urgent Care (no appt availability in office) / [] Appointment(In office/virtual)/ []  [facility]/ [] Home Care/ [x] Refused Recommended Disposition /[] [facility]/ []  Follow-up with PCP Additional Notes: pt refused to make appt stated that her husband did not have to make an appointment. Advised pt that appts are required now. Pt stated that that doent make sense"I sent a MyCHart message this am"

## 2021-08-10 ENCOUNTER — Ambulatory Visit: Payer: Medicare HMO | Admitting: Physical Therapy

## 2021-08-15 ENCOUNTER — Other Ambulatory Visit: Payer: Self-pay | Admitting: Physician Assistant

## 2021-08-15 ENCOUNTER — Telehealth: Payer: Self-pay | Admitting: Physician Assistant

## 2021-08-15 ENCOUNTER — Ambulatory Visit: Payer: Self-pay | Admitting: *Deleted

## 2021-08-15 ENCOUNTER — Ambulatory Visit: Payer: Medicare HMO | Admitting: Physical Therapy

## 2021-08-15 NOTE — Progress Notes (Signed)
Acute Office Visit  Subjective:    Patient ID: Meredith Scott, female    DOB: 12/27/1945, 76 y.o.   MRN: [id number]  Today's Provider: Talitha Givens, MHS, PA-C Introduced myself to the patient as a PA-C and provided education on APPs in clinical practice.    I,Joseline E Rosas,acting as a scribe for [employer], PA-C.,have documented all relevant documentation on the behalf of [place], PA-C,as directed by  [employer], PA-C while in the presence of Jaclin Finks E Braydee Shimkus, PA-C.   Chief Complaint  Patient presents with   covid symptoms    HPI Patient is in today for continued Covid symptoms. Patient treated with antiviral medication. Symptoms: fatigue,post nasal drip. She has been using inhaler, taking Mucinex. Some shallows breath. She is concern because of her asthma. She also reports some night sweats.  States she is using her rescue inhaler approx 2x day to help make sure she is able to breath well Reports chest tightness in the AM that resolves as she gets up and moving She reports productive cough mild sinus congestion and drainage from nose       Past Medical History:  Diagnosis Date   Anxiety    Asthma    Asymptomatic varicose veins 2011   Breast screening, unspecified 2013   Colon polyp    Diffuse cystic mastopathy 2013   Hernia of abdominal wall    History of hiatal hernia    Hypertension    Hypothyroidism    Dr Eddie Dibbles   Obesity, unspecified 2013   Osteoarthritis    PONV (postoperative nausea and vomiting)    Screening for obesity 2013   Skin cancer of face 2018   squamous/ left cheek   Special screening for malignant neoplasms, colon 2013    Past Surgical History:  Procedure Laterality Date   ABDOMINAL HYSTERECTOMY  1994   BREAST CYST ASPIRATION Right 1997?   breast aspiration d/t infected milk gland   BREAST MASS EXCISION Left 2011   COLONOSCOPY  2005   done by Dr. Jamal Collin   COLONOSCOPY N/A 01/27/2015   Procedure: COLONOSCOPY;  Surgeon: Christene Lye, MD;  Location: [facility] END[facility];  Service: Endoscopy;  Laterality: N/A;   COLONOSCOPY WITH PROPOFOL N/A 02/13/2020   Procedure: COLONOSCOPY WITH PROPOFOL;  Surgeon: Robert Bellow, MD;  Location: [facility] ENDOSCOPY;  Service: Endoscopy;  Laterality: N/A;   DILATION AND CURETTAGE OF UTERUS  2007   FINE NEEDLE ASPIRATION     FOOT SURGERY Right    LIPOMA EXCISION  2010   breast bone   SKIN CANCER EXCISION Left 12/2016   under left eye, [facility]   TONGUE BIOPSY      Family History  Problem Relation Age of Onset   Liver cancer Mother    Thyroid disease Mother    Diabetes Father    Healthy Sister    Dementia Sister    Healthy Brother    Lung cancer Maternal Uncle    Arthritis Granddaughter    Lupus Granddaughter    Arthritis Granddaughter    Lupus Granddaughter    Breast cancer Other     Social History   Socioeconomic History   Marital status: Married    Spouse name: Not on file   Number of children: 1   Years of education: H/S   Highest education level: High school graduate  Occupational History   Occupation: Retired  Tobacco Use   Smoking status: Never   Smokeless tobacco: Never  Vaping Use   Vaping Use: Never used  Substance and Sexual Activity   Alcohol use: No   Drug use: No   Sexual activity: Not Currently  Other Topics Concern   Not on file  Social History Narrative   Not on file   Social Determinants of Health   Financial Resource Strain: Low Risk    Difficulty of Paying Living Expenses: Not hard at all  Food Insecurity: No Food Insecurity   Worried About Charity fundraiser in the Last Year: Never true   [place] in the Last Year: Never true  Transportation Needs: No Transportation Needs   Lack of Transportation (Medical): No   Lack of Transportation (Non-Medical): No  Physical Activity: Unknown   Days of Exercise per Week: 3 days   Minutes of Exercise per Session: Not on file  Stress: No Stress Concern Present   Feeling of  Stress : Not at all  Social Connections: Moderately Integrated   Frequency of Communication with Friends and Family: More than three times a week   Frequency of Social Gatherings with Friends and Family: Once a week   Attends Religious Services: More than 4 times per year   Active Member of [employer] or Organizations: No   Attends Archivist Meetings: Never   Marital Status: Married  Human resources officer Violence: Not At Risk   Fear of Current or Ex-Partner: No   Emotionally Abused: No   Physically Abused: No   Sexually Abused: No    Outpatient Medications Prior to Visit  Medication Sig Dispense Refill   Ascorbic Acid (VITAMIN C PO) Take 500 mg by mouth daily.      ascorbic acid (VITAMIN C) 500 MG tablet Take by mouth.      aspirin 81 MG tablet Take 81 mg by mouth daily.     calcium-vitamin D (OSCAL WITH D) 500-200 MG-UNIT tablet Take 1 tablet by mouth daily with breakfast.      Cholecalciferol (VITAMIN D3) 50 MCG (2000 UT) CAPS Take 1 capsule by mouth daily.     COMBIVENT RESPIMAT 20-100 MCG/ACT AERS respimat USE 1 PUFF EVERY 4 HOURS AS NEEDED FOR WHEEZING 4 Inhaler 3   escitalopram (LEXAPRO) 10 MG tablet Take 0.5 tablets (5 mg total) by mouth daily. 45 tablet 1   EST ESTROGENS-METHYLTEST HS 0.625-1.25 MG tablet TAKE 1 TABLET BY MOUTH EVERY 6 DAYS 10 tablet 0   Fish Oil OIL by Does not apply route daily.      levothyroxine (SYNTHROID, LEVOTHROID) 25 MCG tablet Take 25 mcg by mouth daily before breakfast. 2 on Sunday and Thursday     melatonin 3 MG TABS tablet Take 3 mg by mouth at bedtime.     metroNIDAZOLE (METROCREAM) 0.75 % cream      PFIZER-BIONT COVID-19 VAC-TRIS SUSP injection      SYMBICORT 160-4.5 MCG/ACT inhaler INHALE 2 PUFFS INTO THE LUNGS TWICE DAILY 10.2 g 3   Wheat Dextrin (BENEFIBER PO) Take by mouth daily. Or as EOD     WHEAT DEXTRIN PO Take by mouth.     zafirlukast (ACCOLATE) 20 MG tablet Take 1 tablet (20 mg total) by mouth 2 (two) times daily before a meal. 60  tablet 1   No facility-administered medications prior to visit.    Allergies  Allergen Reactions   Shellfish Allergy Other (See Comments)    Throat closes   Sulfa Antibiotics Swelling   Neosporin [Neomycin-Bacitracin Zn-Polymyx] Rash    Review of Systems  Constitutional:  Negative for fatigue and fever.  HENT:  Positive for congestion. Negative for ear pain, postnasal drip, sinus pressure, sinus pain and sore throat.   Respiratory:  Positive for cough, chest tightness and shortness of breath.   Cardiovascular:  Negative for chest pain.  Gastrointestinal:  Negative for diarrhea, nausea and vomiting.  Musculoskeletal:  Negative for arthralgias and myalgias.  Neurological:  Negative for dizziness, light-headedness and headaches.      Objective:    Physical Exam Vitals reviewed.  Constitutional:      Appearance: Normal appearance.  HENT:     Head: Normocephalic and atraumatic.     Nose: Nose normal.     Mouth/Throat:     Mouth: Mucous membranes are moist.     Pharynx: Oropharynx is clear. No oropharyngeal exudate or posterior oropharyngeal erythema.  Eyes:     Extraocular Movements: Extraocular movements intact.     Conjunctiva/sclera: Conjunctivae normal.     Pupils: Pupils are equal, round, and reactive to light.  Cardiovascular:     Rate and Rhythm: Normal rate and regular rhythm.     Pulses: Normal pulses.     Heart sounds: Normal heart sounds.  Pulmonary:     Effort: Pulmonary effort is normal.     Breath sounds: Normal breath sounds. No wheezing, rhonchi or rales.  Musculoskeletal:     Cervical back: Normal range of motion and neck supple.  Lymphadenopathy:     Head:     Right side of head: No submental or submandibular adenopathy.     Left side of head: No submental or submandibular adenopathy.     Upper Body:     Right upper body: No supraclavicular adenopathy.     Left upper body: No supraclavicular adenopathy.  Neurological:     General: No focal deficit  present.     Mental Status: She is alert and oriented to person, place, and time.  Psychiatric:        Mood and Affect: Mood normal.        Behavior: Behavior normal.        Thought Content: Thought content normal.    BP (!) 144/52 (BP Location: Left Arm, Patient Position: Sitting, Cuff Size: Large)    Pulse 68    Temp 97.8 F (36.6 C) (Oral)    Resp 16    Wt 183 lb 4.8 oz (83.1 kg)    SpO2 99%    BMI 32.47 kg/m  Wt Readings from Last 3 Encounters:  08/16/21 183 lb 4.8 oz (83.1 kg)  06/20/21 187 lb 6.4 oz (85 kg)  04/21/21 185 lb 14.4 oz (84.3 kg)    Health Maintenance Due  Topic Date Due   TETANUS/TDAP  04/20/2021    There are no preventive care reminders to display for this patient.   Lab Results  Component Value Date   TSH 2.910 06/28/2021   Lab Results  Component Value Date   WBC 6.5 06/14/2020   HGB 14.2 06/14/2020   HCT 41.3 06/14/2020   MCV 93 06/14/2020   PLT 273 06/14/2020   Lab Results  Component Value Date   NA 143 06/28/2021   K 4.2 06/28/2021   CO2 27 06/28/2021   GLUCOSE 109 (H) 06/28/2021   BUN 15 06/28/2021   CREATININE 0.87 06/28/2021   BILITOT 0.6 06/28/2021   ALKPHOS 82 06/28/2021   AST 17 06/28/2021   ALT 15 06/28/2021   PROT 6.7 06/28/2021   ALBUMIN 4.4 06/28/2021   CALCIUM  9.6 06/28/2021   EGFR 70 06/28/2021   Lab Results  Component Value Date   CHOL 194 06/28/2021   Lab Results  Component Value Date   HDL 55 06/28/2021   Lab Results  Component Value Date   LDLCALC 125 (H) 06/28/2021   Lab Results  Component Value Date   TRIG 75 06/28/2021   Lab Results  Component Value Date   CHOLHDL 3.5 06/28/2021   Lab Results  Component Value Date   HGBA1C 5.7 (H) 06/28/2021       Assessment & Plan:   Problem List Items Addressed This Visit   None  1. Acute COVID-19 Acute, new problem, improving  Patient tested positive for COVID 19 approx 11 days ago and completed antiviral therapy She presents today with concerns for  lingering symptoms and potential impact this will have on her asthma Recommend symptomatic management at this time as well as hydration, rest Provided new script for rescue inhaler to assist with current needs  Recommend follow up as needed for symptoms if not improving or worsening   2. Mild intermittent asthma with acute exacerbation Acute, new exacerbation, stable and well managed with current medications Recommend she continue inhaler use as directed Discussed return and ED precautions Provided refill of Combivent to assist with current exacerbation  - Ipratropium-Albuterol (COMBIVENT RESPIMAT) 20-100 MCG/ACT AERS respimat; Inhale 1 puff into the lungs every 6 (six) hours as needed for wheezing.  Dispense: 4 each; Refill: 3  The entirety of the information documented in the History of Present Illness, Review of Systems and Physical Exam were personally obtained by me. Portions of this information were initially documented by the CMA and reviewed by me for thoroughness and accuracy.   Asante Ritacco E Jhovany Weidinger, PA-C

## 2021-08-15 NOTE — Telephone Encounter (Signed)
Pt is calling to report that she tested positve for covid. Pt reports that SYMBICORT  was sent to the pharamcy was denied 160-4.5 MCG/ACT inhaler [[id number]] . Pt does not know why the medication was denied. And is wanting clarity. Please advise  CB- [phone]

## 2021-08-15 NOTE — Telephone Encounter (Signed)
Summary: both covid sent two clinical calls his MRN [id number]   Pt and wife calling in his  MRN [id number] called in together clinical call sent for both.  Both had antivirals and still symptoms, want to be seen .Marland KitchenMarland KitchenAiesha , this chart covid 8 days, head congestion, very fatiqued still not getting better, feels husband is worse  Call [phone] or [phone] can talk to both        Chief Complaint: persistent symptoms of covid  Symptoms: fatigue head congestion, nasal drainage green x 1 now clear Frequency: covid 8 days now  Pertinent Negatives: Patient denies chest pain difficulty breathing fever Disposition: [] ED /[] Urgent Care (no appt availability in office) / [x] Appointment(In office/virtual)/ []  [facility]/ [] Home Care/ [] Refused Recommended Disposition /[] [facility]/ []  Follow-up with PCP Additional Notes:  Finished antiviral medication Saturday . Hx asthma appt scheduled in office do you want to make virtual ?    Reason for Disposition  [1] PERSISTING SYMPTOMS OF COVID-19 AND [2] symptoms WORSE  Answer Assessment - Initial Assessment Questions 1. COVID-19 DIAGNOSIS: "Who made your COVID-19 diagnosis?" "Was it confirmed by a positive lab test or self-test?" If not diagnosed by a doctor (or NP/PA), ask "Are there lots of cases (community spread) where you live?" Note: See public health department website, if unsure.     Post covid day 8 2. COVID-19 EXPOSURE: "Was there any known exposure to COVID before the symptoms began?" CDC Definition of close contact: within 6 feet (2 meters) for a total of 15 minutes or more over a 24-hour period.      na 3. ONSET: "When did the COVID-19 symptoms start?"      na 4. WORST SYMPTOM: "What is your worst symptom?" (e.g., cough, fever, shortness of breath, muscle aches)     Still experiencing head congestion , nasal drainage green x 1 and clear  5. COUGH: "Do you have a cough?" If Yes, ask: "How bad is the cough?"        na 6. FEVER: "Do you have a fever?" If Yes, ask: "What is your temperature, how was it measured, and when did it start?"     na 7. RESPIRATORY STATUS: "Describe your breathing?" (e.g., shortness of breath, wheezing, unable to speak)      Denies  8. BETTER-SAME-WORSE: "Are you getting better, staying the same or getting worse compared to yesterday?"  If getting worse, ask, "In what way?"     na 9. HIGH RISK DISEASE: "Do you have any chronic medical problems?" (e.g., asthma, heart or lung disease, weak immune system, obesity, etc.)     Asthma  10. VACCINE: "Have you had the COVID-19 vaccine?" If Yes, ask: "Which one, how many shots, when did you get it?"       na 11. BOOSTER: "Have you received your COVID-19 booster?" If Yes, ask: "Which one and when did you get it?"       na 12. PREGNANCY: "Is there any chance you are pregnant?" "When was your last menstrual period?"       na 13. OTHER SYMPTOMS: "Do you have any other symptoms?"  (e.g., chills, fatigue, headache, loss of smell or taste, muscle pain, sore throat)       Fatigue  head congestion nasal drainage 14. O2 SATURATION MONITOR:  "Do you use an oxygen saturation monitor (pulse oximeter) at home?" If Yes, ask "What is your reading (oxygen level) today?" "What is your usual oxygen saturation reading?" (e.g., 95%)  na  Answer Assessment - Initial Assessment Questions 1. COVID-19 ONSET: "When did the symptoms of COVID-19 first start?"     8 days ago  2. DIAGNOSIS CONFIRMATION: "How were you diagnosed?" (e.g., COVID-19 oral or nasal viral test; COVID-19 antibody test; doctor visit)     Na  3. MAIN SYMPTOM:  "What is your main concern or symptom right now?" (e.g., breathing difficulty, cough, fatigue. loss of smell)     Head congestion nasal drainage fatigue  4. SYMPTOM ONSET: "When did the  na  start?"     na 5. BETTER-SAME-WORSE: "Are you getting better, staying the same, or getting worse over the last 1 to 2 weeks?"     Same  6.  RECENT MEDICAL VISIT: "Have you been seen by a healthcare provider (doctor, NP, PA) for these persisting COVID-19 symptoms?" If Yes, ask: "When were you seen?" (e.g., date)     Yes took antiviral medication 7. COUGH: "Do you have a cough?" If Yes, ask: "How bad is the cough?"       na 8. FEVER: "Do you have a fever?" If Yes, ask: "What is your temperature, how was it measured, and when did it start?"     no 9. BREATHING DIFFICULTY: "Are you having any trouble breathing?" If Yes, ask: "How bad is your breathing?" (e.g., mild, moderate, severe)    - MILD: No SOB at rest, mild SOB with walking, speaks normally in sentences, can lie down, no retractions, pulse < 100.    - MODERATE: SOB at rest, SOB with minimal exertion and prefers to sit, cannot lie down flat, speaks in phrases, mild retractions, audible wheezing, pulse 100-120.    - SEVERE: Very SOB at rest, speaks in single words, struggling to breathe, sitting hunched forward, retractions, pulse > 120       Denies  10. HIGH RISK DISEASE: "Do you have any chronic medical problems?" (e.g., asthma, heart or lung disease, weak immune system, obesity, etc.)       Asthma  11. VACCINE: "Have you gotten the COVID-19 vaccine?" If Yes, ask: "Which one, how many shots, when did you get it?"       na 12. BOOSTER: "Have you received your COVID-19 booster?" If Yes, ask: "Which one and when did you get it?"       na 13. PREGNANCY: "Is there any chance you are pregnant?" "When was your last menstrual period?"       na 14. OTHER SYMPTOMS: "Do you have any other symptoms?"  (e.g., fatigue, headache, muscle pain, weakness)       Head congestion , nasal drainage  15. O2 SATURATION MONITOR:  "Do you use an oxygen saturation monitor (pulse oximeter) at home?" If Yes, ask "What is your reading (oxygen level) today?" "What is your usual oxygen saturation reading?" (e.g., 95%)       na  Protocols used: Coronavirus (COVID-19) Diagnosed or Suspected-[facility], Coronavirus  (COVID-19) Persisting Symptoms Follow-up Call-[facility]

## 2021-08-16 ENCOUNTER — Encounter: Payer: Self-pay | Admitting: Physician Assistant

## 2021-08-16 ENCOUNTER — Other Ambulatory Visit: Payer: Self-pay

## 2021-08-16 ENCOUNTER — Ambulatory Visit (INDEPENDENT_AMBULATORY_CARE_PROVIDER_SITE_OTHER): Payer: Medicare HMO | Admitting: Physician Assistant

## 2021-08-16 VITALS — BP 144/52 | HR 68 | Temp 97.8°F | Resp 16 | Wt 183.3 lb

## 2021-08-16 DIAGNOSIS — J4521 Mild intermittent asthma with (acute) exacerbation: Secondary | ICD-10-CM | POA: Diagnosis not present

## 2021-08-16 DIAGNOSIS — U071 COVID-19: Secondary | ICD-10-CM

## 2021-08-16 MED ORDER — COMBIVENT RESPIMAT 20-100 MCG/ACT IN AERS: 1.0000 | INHALATION_SPRAY | Freq: Four times a day (QID) | RESPIRATORY_TRACT | 3 refills | Status: DC | PRN

## 2021-08-16 NOTE — Patient Instructions (Signed)
At this time it is best to try to manage your symptoms To do this you can take Mucinex or a formulation with multi-symptom relief indications (similar ingredients to Dayquil/Nyquil) Please continue to use your inhalers to preserve your breathing function Stay well hydrated and rest as much as possible Until you are consistently feeling better I would decrease your activity by about 25 % to prevent excess fatigue  Please let us know if your symptoms are not improving or worsening  It was nice to meet you and I appreciate the opportunity to be involved in your care

## 2021-08-17 ENCOUNTER — Encounter: Payer: Medicare HMO | Admitting: Physical Therapy

## 2021-08-19 DIAGNOSIS — E039 Hypothyroidism, unspecified: Secondary | ICD-10-CM | POA: Diagnosis not present

## 2021-08-21 ENCOUNTER — Encounter: Payer: Self-pay | Admitting: Physician Assistant

## 2021-08-22 ENCOUNTER — Encounter: Payer: Self-pay | Admitting: Physical Therapy

## 2021-08-22 ENCOUNTER — Ambulatory Visit: Payer: Medicare HMO | Attending: Family Medicine | Admitting: Physical Therapy

## 2021-08-22 DIAGNOSIS — E669 Obesity, unspecified: Secondary | ICD-10-CM | POA: Diagnosis not present

## 2021-08-22 DIAGNOSIS — E039 Hypothyroidism, unspecified: Secondary | ICD-10-CM | POA: Diagnosis not present

## 2021-08-22 DIAGNOSIS — M25511 Pain in right shoulder: Secondary | ICD-10-CM | POA: Diagnosis not present

## 2021-08-22 DIAGNOSIS — G8929 Other chronic pain: Secondary | ICD-10-CM | POA: Insufficient documentation

## 2021-08-22 NOTE — Therapy (Signed)
[facility] [address], [place], [address] Phone: [phone]   Fax:  [phone]  Physical Therapy Treatment  Patient Details  Name: Meredith Scott MRN: [id number] Date of Birth: 10-14-1945 Referring Provider (PT): Brita Romp MD   Encounter Date: 08/22/2021   PT End of Session - 08/22/21 1128     Visit Number 5    Number of Visits 17    Date for PT Re-Evaluation 09/23/21    Authorization - Visit Number 5    Authorization - Number of Visits 10    PT Start Time 0900    PT Stop Time 0940    PT Time Calculation (min) 40 min    Activity Tolerance Patient tolerated treatment well    Behavior During Therapy [facility] for tasks assessed/performed             Past Medical History:  Diagnosis Date   Anxiety    Asthma    Asymptomatic varicose veins 2011   Breast screening, unspecified 2013   Colon polyp    Diffuse cystic mastopathy 2013   Hernia of abdominal wall    History of hiatal hernia    Hypertension    Hypothyroidism    Dr Eddie Dibbles   Obesity, unspecified 2013   Osteoarthritis    PONV (postoperative nausea and vomiting)    Screening for obesity 2013   Skin cancer of face 2018   squamous/ left cheek   Special screening for malignant neoplasms, colon 2013    Past Surgical History:  Procedure Laterality Date   ABDOMINAL HYSTERECTOMY  1994   BREAST CYST ASPIRATION Right 1997?   breast aspiration d/t infected milk gland   BREAST MASS EXCISION Left 2011   COLONOSCOPY  2005   done by Dr. Jamal Collin   COLONOSCOPY N/A 01/27/2015   Procedure: COLONOSCOPY;  Surgeon: Christene Lye, MD;  Location: [facility] ENDOSCOPY;  Service: Endoscopy;  Laterality: N/A;   COLONOSCOPY WITH PROPOFOL N/A 02/13/2020   Procedure: COLONOSCOPY WITH PROPOFOL;  Surgeon: Robert Bellow, MD;  Location: [facility] ENDOSCOPY;  Service: Endoscopy;  Laterality: N/A;   DILATION AND CURETTAGE OF UTERUS  2007   FINE NEEDLE ASPIRATION      FOOT SURGERY Right    LIPOMA EXCISION  2010   breast bone   SKIN CANCER EXCISION Left 12/2016   under left eye, [facility]   TONGUE BIOPSY      There were no vitals filed for this visit.   Subjective Assessment - 08/22/21 0903     Subjective pnt reports that she is recovering from Patrick and has no pain in her shoulder. Has not completed HEP d/t being down sick    Pertinent History Pt is a 76 year old female reporting with chronic R shoulder pain. She has had chronic R shoulder pain for several years, but reports pain was exacerbated last year with insideous onset. Reports pain is more posterior lateral, and bothers the R side of her neck as well. Reports pain is a stiff ache and comes on with aggravating activities such as reaching behind her and behind the back, vacuuming, carrying things, turning her head to the R, and overhead reaching. Lowest pain 2/10 ("it's always there"); worst 9/10; current 5/10. 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 She lives at home with her husband who helps with heavy household chores, though she is able to bath, dress, feed modI with some modifications needed for behind the back reaching. Patient is R handed. Denies any n/t. Pt denies N/V, B&B changes, unexplained weight fluctuation, saddle paresthesia, fever, night sweats, or unrelenting night pain at this time.    Limitations Lifting;House hold activities;Sitting    How long can you sit comfortably? unlimited    How long can you stand comfortably? unlimited    How long can you walk comfortably? unlimited    Diagnostic tests None since 2016    Currently in Pain? No/denies    Pain Score 0-No pain    Pain Location Shoulder    Pain Orientation Right;Posterior;Lateral    Pain Descriptors / Indicators Aching    Pain Type  Chronic pain    Pain Onset More than a month ago    Pain Frequency Intermittent                      Ther-Ex Pulleys AAROM flex and abd 2 min each with near full flex with this, lacking approx 40d abd (cuing needed for true abd)   Seated Thoracic Lumbar Extension with dowel over towel roll x12 with min cueing for thoracic extension   Seated thoracic rotation with stability ball 2 x 12    Seated ER YTB 3 x12  with cueing to bring shoulder blades down and back   Seated scapular retractions YTB  x 12- pnt reports too easy  -repeated with RTB 2 x 12- mod cueing to keep shoulders down   Seated DNF head nods 3 x8- pnt educated on DNF  Seated Thoracic Ball roll out x12  Wall Angels 2 x 6   Bilateral Standing thoracic rotation against the wall- mod cueing for following hand with eyes  SPT reviewed HEP with pnt- UT, levator stretch, pec stretch against wall                           PT Education - 08/22/21 1128     Education Details therex form, HEP    Person(s) Educated Patient    Methods Explanation;Demonstration;Tactile cues;Verbal cues    Comprehension Verbalized understanding;Returned demonstration              PT Short Term Goals - 07/20/21 1525       PT SHORT TERM GOAL #1   Title Pt will be independent with HEP in order to improve strength and decrease pain in order to improve pain-free function at home and work.    Baseline 07/20/21 HEP given    Time 4    Period Weeks    Status New               PT Long Term Goals - 07/20/21 1525       PT LONG TERM GOAL #1   Title Pt will decrease worst pain as reported on NPRS by at least 3 points in order to demonstrate clinically significant reduction in pain.    Baseline 08/20/21 9/10    Time 8    Period Weeks    Status New      PT LONG TERM GOAL #2   Title Pt will increase gross periscapular strength to at least 4/5 MMT grade bilat in order to demonstrate improvement in strength  and function    Baseline 07/20/21 R/L T scap retractors  4-/4; Y lower trap 3+/41    Time 8    Period Weeks    Status New      PT LONG TERM GOAL #3   Title Pt will demonstrate full active shoulder ROM in order to complete household ADLs and self care ADLs    Baseline 07/20/21 flex 141d; abd 122d; ER C7; IR T 12    Time 8    Period Weeks    Status New      PT LONG TERM GOAL #4   Title Patient will increase FOTO score to 64 to demonstrate predicted increase in functional mobility to complete ADLs    Baseline 07/20/21 54    Time 8    Period Weeks    Status New                   Plan - 08/22/21 1129     Clinical Impression Statement pnt presented with no pain today as she has been resting post covid. this allowed for therex progression today to include thoracic mobility, strengthening parascapular muscles, and postural education. pnt tolerated therex well with min cueing for therex form and maintained good motivation throughout session. spt reviewed HEP and pnt demonstrated and expressed verbal understanding of therex form and HEP. PT will progress as able.    Personal Factors and Comorbidities Comorbidity 2;Fitness;Past/Current Experience;Time since onset of injury/illness/exacerbation    Comorbidities OA, HTN , hypothyroid    Examination-Activity Limitations Reach Overhead;Lift;Carry;Bed Mobility;Dressing;Hygiene/Grooming    Examination-Participation Restrictions Cleaning;Community Activity;Occupation;Meal Prep    Stability/Clinical Decision Making Evolving/Moderate complexity    Clinical Decision Making Moderate    Rehab Potential Good    PT Frequency 2x / week    PT Duration 8 weeks    PT Treatment/Interventions ADLs/Self Care Home Management;Cryotherapy;Electrical Stimulation;Moist Heat;Traction;Ultrasound;DME Instruction;Aquatic Therapy;Fluidtherapy;Therapeutic exercise;Functional mobility training;Neuromuscular re-education;Manual techniques;Passive range of motion;Spinal  Manipulations;Joint Manipulations;Dry needling;Taping;Splinting;Patient/family education    PT Next Visit Plan periscapular strengthening, UT/levator/pec minor lengthening, increased scapulothoracic mobility, shoulder AAROM    PT Home Exercise Plan UT and levator stretch, thoracic ext over towel with pec stretch    Consulted and Agree with Plan of Care Patient             Patient will benefit from skilled therapeutic intervention in order to improve the following deficits and impairments:  Decreased activity tolerance, Decreased endurance, Decreased range of motion, Decreased strength, Increased fascial restricitons, Impaired UE functional use, Pain, Improper body mechanics, Postural dysfunction, Impaired flexibility, Impaired tone, Decreased coordination, Decreased mobility, Decreased safety awareness, Increased muscle spasms  Visit Diagnosis: Chronic right shoulder pain     Problem List Patient Active Problem List   Diagnosis Date Noted   Chronic right shoulder pain 06/20/2021   Chronic idiopathic constipation 04/21/2021   Pelvic pressure in female 08/12/2020   Family history of systemic lupus erythematosus 06/14/2020   Family history of arthritis 06/14/2020   Osteoarthritis of knee 06/06/2019   Sprain of knee 06/06/2019   Pancreatic cyst 01/02/2019   Abnormal weight gain 07/31/2018   Incisional hernia, without obstruction or gangrene 04/17/2018   Other constipation 12/26/2017   History of colonic polyps 12/26/2017   Diverticulosis 12/24/2017   Elevated LDL cholesterol level 05/28/2015   Acid reflux 04/17/2015   Acquired hypothyroidism 04/17/2015   Arthritis 04/17/2015   Family history of diabetes mellitus 04/17/2015   Allergic rhinitis 04/07/2015   Anxiety 04/07/2015   Arthropathy of temporomandibular joint 04/07/2015   Spasm 04/07/2015   Climacteric 04/07/2015   Adenopathy, cervical  04/07/2015   Asthma 02/02/2015   Fibrocystic breast disease 11/26/2012    Durwin Reges DPT Claiborne Billings O'Daniel, SPT Durwin Reges, PT 08/22/2021, 11:58 AM  [facility] [address] [place], [address] Phone: [phone]   Fax:  [phone]  Name: Meredith Scott MRN: [id number] Date of Birth: [birth year]

## 2021-08-24 ENCOUNTER — Ambulatory Visit: Payer: Medicare HMO | Admitting: Physical Therapy

## 2021-08-24 ENCOUNTER — Other Ambulatory Visit: Payer: Self-pay

## 2021-08-24 ENCOUNTER — Encounter: Payer: Self-pay | Admitting: Physical Therapy

## 2021-08-24 DIAGNOSIS — G8929 Other chronic pain: Secondary | ICD-10-CM | POA: Diagnosis not present

## 2021-08-24 DIAGNOSIS — M25511 Pain in right shoulder: Secondary | ICD-10-CM | POA: Diagnosis not present

## 2021-08-24 NOTE — Therapy (Signed)
[facility] [address] [place], [place], [address] Phone: [phone]   Fax:  [phone]  Physical Therapy Treatment  Patient Details  Name: Meredith Scott MRN: [id number] Date of Birth: July 31, 1945 Referring Provider (PT): Brita Romp MD   Encounter Date: 08/24/2021   PT End of Session - 08/24/21 0945     Visit Number 6    Number of Visits 17    Date for PT Re-Evaluation 09/23/21    Authorization - Visit Number 6    Authorization - Number of Visits 10    PT Start Time 0900    PT Stop Time 0945    PT Time Calculation (min) 45 min    Activity Tolerance Patient tolerated treatment well    Behavior During Therapy [facility] for tasks assessed/performed             Past Medical History:  Diagnosis Date   Anxiety    Asthma    Asymptomatic varicose veins 2011   Breast screening, unspecified 2013   Colon polyp    Diffuse cystic mastopathy 2013   Hernia of abdominal wall    History of hiatal hernia    Hypertension    Hypothyroidism    Dr Eddie Dibbles   Obesity, unspecified 2013   Osteoarthritis    PONV (postoperative nausea and vomiting)    Screening for obesity 2013   Skin cancer of face 2018   squamous/ left cheek   Special screening for malignant neoplasms, colon 2013    Past Surgical History:  Procedure Laterality Date   ABDOMINAL HYSTERECTOMY  1994   BREAST CYST ASPIRATION Right 1997?   breast aspiration d/t infected milk gland   BREAST MASS EXCISION Left 2011   COLONOSCOPY  2005   done by Dr. Jamal Collin   COLONOSCOPY N/A 01/27/2015   Procedure: COLONOSCOPY;  Surgeon: Christene Lye, MD;  Location: [facility] ENDOSCOPY;  Service: Endoscopy;  Laterality: N/A;   COLONOSCOPY WITH PROPOFOL N/A 02/13/2020   Procedure: COLONOSCOPY WITH PROPOFOL;  Surgeon: Robert Bellow, MD;  Location: [facility] ENDOSCOPY;  Service: Endoscopy;  Laterality: N/A;   DILATION AND CURETTAGE OF UTERUS  2007   FINE NEEDLE ASPIRATION      FOOT SURGERY Right    LIPOMA EXCISION  2010   breast bone   SKIN CANCER EXCISION Left 12/2016   under left eye,  [facility]   TONGUE BIOPSY      There were no vitals filed for this visit.   Subjective Assessment - 08/24/21 0901     Subjective pnt reports pain yesterday after carrying groceries but no pain today. pnt reports being compliant with HEP    Pertinent History Pt is a 76 year old female reporting with chronic R shoulder pain. She has had chronic R shoulder pain for several years, but reports pain was exacerbated last year with insideous onset. Reports pain is more posterior lateral, and bothers the R side of her neck as well. Reports pain is a stiff ache and comes on with aggravating activities such as reaching behind her and behind the back, vacuuming, carrying things, turning her head to the R, and overhead reaching. Lowest pain 2/10 ("it's always there"); worst 9/10; current 5/10. 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 She lives at  home with her husband who helps with heavy household chores, though she is able to bath, dress, feed modI with some modifications needed for behind the back reaching. Patient is R handed. Denies any n/t. Pt denies N/V, B&B changes, unexplained weight fluctuation, saddle paresthesia, fever, night sweats, or unrelenting night pain at this time.    Limitations Lifting;House hold activities;Sitting    How long can you sit comfortably? unlimited    How long can you stand comfortably? unlimited    How long can you walk comfortably? unlimited    Diagnostic tests None since 2016    Currently in Pain? No/denies    Pain Score 0-No pain               Ther-Ex Pulleys AAROM flex and abd 2 min each with near full flex with this, lacking approx 40d abd (cuing needed  for true abd)   Seated Thoracic Lumbar Extension with dowel over towel roll 2 x12 with min cueing for thoracic extension and following with eyes    Seated thoracic rotation with stability ball 2 x 12    Seated ER YTB 4 x12  with mod tactile cueing to bring shoulder blades down and back   Seated scapular retractions with GTB 3 x 12- mod cueing to keep shoulders down and engage scapular retractors    Seated DNF head nods 3 x 10 min cueing to engage DNF and education on posture muscles/ endurance   Seated neck extensor stretch x12    Wall Angels 3 x 8- min cueing on form to keep ands close to wall    Bilateral Standing thoracic rotation against the wall- mod cueing for following hand with eyes 2 x12    HEP updated to include scapular retractions with GTB and wall angels                         PT Education - 08/24/21 0945     Education Details therex form, HEP    Person(s) Educated Patient    Methods Explanation;Demonstration;Tactile cues;Verbal cues    Comprehension Verbalized understanding;Returned demonstration              PT Short Term Goals - 07/20/21 1525       PT SHORT TERM GOAL #1   Title Pt will be independent with HEP in order to improve strength and decrease pain in order to improve pain-free function at home and work.    Baseline 07/20/21 HEP given    Time 4    Period Weeks    Status New               PT Long Term Goals - 07/20/21 1525       PT LONG TERM GOAL #1   Title Pt will decrease worst pain as reported on NPRS by at least 3 points in order to demonstrate clinically significant reduction in pain.    Baseline 08/20/21 9/10    Time 8    Period Weeks    Status New      PT LONG TERM GOAL #2   Title Pt will increase gross periscapular strength to at least 4/5 MMT grade bilat in order to demonstrate improvement in strength and function    Baseline 07/20/21 R/L T scap retractors 4-/4; Y lower trap 3+/41    Time 8    Period Weeks     Status New      PT LONG TERM GOAL #3  Title Pt will demonstrate full active shoulder ROM in order to complete household ADLs and self care ADLs    Baseline 07/20/21 flex 141d; abd 122d; ER C7; IR T 12    Time 8    Period Weeks    Status New      PT LONG TERM GOAL #4   Title Patient will increase FOTO score to 64 to demonstrate predicted increase in functional mobility to complete ADLs    Baseline 07/20/21 54    Time 8    Period Weeks    Status New                   Plan - 08/24/21 0948     Clinical Impression Statement pnt presented with no pain today which allowed for therex progression to include parascapular endurance and postural education. pnt able to comply with mod cueing with no increase in pain. pnt maintained good motivation throughout session and demonstrated as well as expressed verbal understanding of updated HEP and therex instruction. Pt will continue as able    Personal Factors and Comorbidities Comorbidity 2;Fitness;Past/Current Experience;Time since onset of injury/illness/exacerbation    Comorbidities OA, HTN , hypothyroid    Examination-Activity Limitations Reach Overhead;Lift;Carry;Bed Mobility;Dressing;Hygiene/Grooming    Examination-Participation Restrictions Cleaning;Community Activity;Occupation;Meal Prep    Stability/Clinical Decision Making Evolving/Moderate complexity    Clinical Decision Making Moderate    Rehab Potential Good    PT Frequency 2x / week    PT Duration 8 weeks    PT Treatment/Interventions ADLs/Self Care Home Management;Cryotherapy;Electrical Stimulation;Moist Heat;Traction;Ultrasound;DME Instruction;Aquatic Therapy;Fluidtherapy;Therapeutic exercise;Functional mobility training;Neuromuscular re-education;Manual techniques;Passive range of motion;Spinal Manipulations;Joint Manipulations;Dry needling;Taping;Splinting;Patient/family education    PT Next Visit Plan increased scapulothoracic mobility and parascapular strengthening    PT  Home Exercise Plan UT and levator stretch, thoracic ext over towel with pec stretch, rows BTB, and ER with RTB    Consulted and Agree with Plan of Care Patient             Patient will benefit from skilled therapeutic intervention in order to improve the following deficits and impairments:  Decreased activity tolerance, Decreased endurance, Decreased range of motion, Decreased strength, Increased fascial restricitons, Impaired UE functional use, Pain, Improper body mechanics, Postural dysfunction, Impaired flexibility, Impaired tone, Decreased coordination, Decreased mobility, Decreased safety awareness, Increased muscle spasms  Visit Diagnosis: Chronic right shoulder pain     Problem List Patient Active Problem List   Diagnosis Date Noted   Chronic right shoulder pain 06/20/2021   Chronic idiopathic constipation 04/21/2021   Pelvic pressure in female 08/12/2020   Family history of systemic lupus erythematosus 06/14/2020   Family history of arthritis 06/14/2020   Osteoarthritis of knee 06/06/2019   Sprain of knee 06/06/2019   Pancreatic cyst 01/02/2019   Abnormal weight gain 07/31/2018   Incisional hernia, without obstruction or gangrene 04/17/2018   Other constipation 12/26/2017   History of colonic polyps 12/26/2017   Diverticulosis 12/24/2017   Elevated LDL cholesterol level 05/28/2015   Acid reflux 04/17/2015   Acquired hypothyroidism 04/17/2015   Arthritis 04/17/2015   Family history of diabetes mellitus 04/17/2015   Allergic rhinitis 04/07/2015   Anxiety 04/07/2015   Arthropathy of temporomandibular joint 04/07/2015   Spasm 04/07/2015   Climacteric 04/07/2015   Adenopathy, cervical 04/07/2015   Asthma 02/02/2015   Fibrocystic breast disease 11/26/2012     Durwin Reges DPT Claiborne Billings O'Daniel, SPT Durwin Reges, PT 08/24/2021, 3:54 PM  [facility] [address] [place],  [address] Phone: [phone]   Fax:  [phone]  Name: Meredith Scott MRN: [id number] Date of Birth: 09-29-1945

## 2021-08-29 ENCOUNTER — Encounter: Payer: Medicare HMO | Admitting: Physical Therapy

## 2021-08-31 ENCOUNTER — Other Ambulatory Visit: Payer: Self-pay

## 2021-08-31 ENCOUNTER — Encounter: Payer: Self-pay | Admitting: Physical Therapy

## 2021-08-31 ENCOUNTER — Ambulatory Visit: Payer: Medicare HMO | Admitting: Physical Therapy

## 2021-08-31 DIAGNOSIS — G8929 Other chronic pain: Secondary | ICD-10-CM

## 2021-08-31 DIAGNOSIS — M25511 Pain in right shoulder: Secondary | ICD-10-CM | POA: Diagnosis not present

## 2021-08-31 NOTE — Therapy (Signed)
[facility] [address], [place], [address] Phone: [phone]   Fax:  [phone]  Physical Therapy Treatment  Patient Details  Name: Meredith Scott MRN: [id number] Date of Birth: May 10, 1946 Referring Provider (PT): Brita Romp MD   Encounter Date: 08/31/2021   PT End of Session - 08/31/21 1032     Visit Number 7    Number of Visits 17    Date for PT Re-Evaluation 09/23/21    Authorization - Visit Number 6    Authorization - Number of Visits 10    PT Start Time 0904    PT Stop Time 0945    PT Time Calculation (min) 41 min    Activity Tolerance Patient tolerated treatment well    Behavior During Therapy [facility] for tasks assessed/performed             Past Medical History:  Diagnosis Date   Anxiety    Asthma    Asymptomatic varicose veins 2011   Breast screening, unspecified 2013   Colon polyp    Diffuse cystic mastopathy 2013   Hernia of abdominal wall    History of hiatal hernia    Hypertension    Hypothyroidism    Dr Eddie Dibbles   Obesity, unspecified 2013   Osteoarthritis    PONV (postoperative nausea and vomiting)    Screening for obesity 2013   Skin cancer of face 2018   squamous/ left cheek   Special screening for malignant neoplasms, colon 2013    Past Surgical History:  Procedure Laterality Date   ABDOMINAL HYSTERECTOMY  1994   BREAST CYST ASPIRATION Right 1997?   breast aspiration d/t infected milk gland   BREAST MASS EXCISION Left 2011   COLONOSCOPY  2005   done by Dr. Jamal Collin   COLONOSCOPY N/A 01/27/2015   Procedure: COLONOSCOPY;  Surgeon: Christene Lye, MD;  Location: [facility] ENDOSCOPY;  Service: Endoscopy;  Laterality: N/A;   COLONOSCOPY WITH PROPOFOL N/A 02/13/2020   Procedure: COLONOSCOPY WITH PROPOFOL;  Surgeon: Robert Bellow, MD;  Location: [facility] ENDOSCOPY;  Service: Endoscopy;  Laterality: N/A;   DILATION AND CURETTAGE OF UTERUS  2007   FINE NEEDLE ASPIRATION      FOOT SURGERY Right    LIPOMA EXCISION  2010   breast bone   SKIN CANCER EXCISION Left 12/2016   under left eye, [facility]   TONGUE BIOPSY      There were no vitals filed for this visit.   Subjective Assessment - 08/31/21 0906     Subjective Pt states she missed Monday appointment due to a dentist appointment. Reports compliance with HEP. States she took Sunday as a rest day. States she believes an exercise within the session may have caused some discomfort in the shoulder.    Pertinent History Pt is a 76 year old female reporting with chronic R shoulder pain. She has had chronic R shoulder pain for several years, but reports pain was exacerbated last year with insideous onset. Reports pain is more posterior lateral, and bothers the R side of her neck as well. Reports pain is a stiff ache and comes on with aggravating activities such as reaching behind her and behind the back, vacuuming, carrying things, turning her head to the R, and overhead reaching. Lowest pain 2/10 ("it's always there"); worst 9/10; current 5/10. 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 She lives at home with her husband who helps with heavy household chores, though she is able to bath, dress, feed modI with some modifications needed for behind the back reaching. Patient is R handed. Denies any n/t. Pt denies N/V, B&B changes, unexplained weight fluctuation, saddle paresthesia, fever, night sweats, or unrelenting night pain at this time.    Limitations Lifting;House hold activities;Sitting    How long can you sit comfortably? unlimited    How long can you stand comfortably? unlimited    How long can you walk comfortably? unlimited    Diagnostic tests None since 2016    Currently in Pain? No/denies               **RIGHT  SHOULDER**  INTERVENTIONS   Ther-Ex Pulleys AAROM flex and abd 2 min each with near full flex with this, lacking approx 40d abd (cuing needed for true abd)   Seated thoracolumbar extension with dowel rod (x1 set) and neutral grip (x1 set) w/ towel roll behind back to encourage extension, x12 reps each set with min cueing for thoracic extension and following with eyes    Seated thoracic rotation with red stability ball 2 x 12 reps each side    Seated ER YTB 3 x12 with mod tactile cueing to bring shoulder blades down and back   Seated row with emphasis on scapular retractions with GTB 3 x 12- mod cueing to keep shoulders down and engage scapular retractors    Seated DNF head nods w/ light resistance by pt hand 3 x 10    Seated neck extensor stretch 3 x 30 seconds w/ light overpressure    Wall Angels 3 x 12- min cueing on form to keep ands close to wall    Bilateral standing thoracic rotation ("open book") against the wall, with staggered stance - mod cueing for following hand with eyes x12 reps   -light over pressure by PT    Clinical Impression: Pt is pleasant and motivated throughout session. She communicates muscular fatigue well with PT; rest breaks taken as needed. ROM is progressing well with notable improvement in abduction ROM within session. Plan to incorporate more strengthening interventions in next session to build up right GH muscular strength and endurance. She will continue to benefit from skilled PT interventions to address above deficits and allow pt to fulfill caregiver duties and enjoy time with her grand kids/great-grand kids.          PT Short Term Goals - 07/20/21 1525       PT SHORT TERM GOAL #1   Title Pt will be independent with HEP in order to improve strength and decrease pain in order to improve pain-free function at home and work.    Baseline 07/20/21 HEP given    Time 4    Period Weeks    Status New               PT Long Term Goals -  07/20/21 1525       PT LONG TERM GOAL #1   Title Pt will decrease worst pain as reported on NPRS by at least 3 points in order to demonstrate clinically significant reduction in pain.    Baseline 08/20/21 9/10    Time 8    Period Weeks    Status New      PT LONG TERM GOAL #2  Title Pt will increase gross periscapular strength to at least 4/5 MMT grade bilat in order to demonstrate improvement in strength and function    Baseline 07/20/21 R/L T scap retractors 4-/4; Y lower trap 3+/41    Time 8    Period Weeks    Status New      PT LONG TERM GOAL #3   Title Pt will demonstrate full active shoulder ROM in order to complete household ADLs and self care ADLs    Baseline 07/20/21 flex 141d; abd 122d; ER C7; IR T 12    Time 8    Period Weeks    Status New      PT LONG TERM GOAL #4   Title Patient will increase FOTO score to 64 to demonstrate predicted increase in functional mobility to complete ADLs    Baseline 07/20/21 54    Time 8    Period Weeks    Status New                   Plan - 08/31/21 1027     Clinical Impression Statement Pt is pleasant and motivated throughout session. She communicates muscular fatigue well with PT; rest breaks taken as needed. ROM is progressing well with notable improvement in abduction ROM within session. Plan to incorporate more strengthening interventions in next session to build up right GH muscular strength and endurance. She will continue to benefit from skilled PT interventions to address above deficits and allow pt to fulfill caregiver duties and enjoy time with her grand kids/great-grand kids.    Personal Factors and Comorbidities Comorbidity 2;Fitness;Past/Current Experience;Time since onset of injury/illness/exacerbation    Comorbidities OA, HTN , hypothyroid    Examination-Activity Limitations Reach Overhead;Lift;Carry;Bed Mobility;Dressing;Hygiene/Grooming    Examination-Participation Restrictions Cleaning;Community  Activity;Occupation;Meal Prep    Stability/Clinical Decision Making Evolving/Moderate complexity    Rehab Potential Good    PT Frequency 2x / week    PT Duration 8 weeks    PT Treatment/Interventions ADLs/Self Care Home Management;Cryotherapy;Electrical Stimulation;Moist Heat;Traction;Ultrasound;DME Instruction;Aquatic Therapy;Fluidtherapy;Therapeutic exercise;Functional mobility training;Neuromuscular re-education;Manual techniques;Passive range of motion;Spinal Manipulations;Joint Manipulations;Dry needling;Taping;Splinting;Patient/family education    PT Next Visit Plan increased scapulothoracic mobility and parascapular strengthening    PT Home Exercise Plan UT and levator stretch, thoracic ext over towel with pec stretch, rows BTB, and ER with RTB    Consulted and Agree with Plan of Care Patient             Patient will benefit from skilled therapeutic intervention in order to improve the following deficits and impairments:  Decreased activity tolerance, Decreased endurance, Decreased range of motion, Decreased strength, Increased fascial restricitons, Impaired UE functional use, Pain, Improper body mechanics, Postural dysfunction, Impaired flexibility, Impaired tone, Decreased coordination, Decreased mobility, Decreased safety awareness, Increased muscle spasms  Visit Diagnosis: Chronic right shoulder pain     Problem List Patient Active Problem List   Diagnosis Date Noted   Chronic right shoulder pain 06/20/2021   Chronic idiopathic constipation 04/21/2021   Pelvic pressure in female 08/12/2020   Family history of systemic lupus erythematosus 06/14/2020   Family history of arthritis 06/14/2020   Osteoarthritis of knee 06/06/2019   Sprain of knee 06/06/2019   Pancreatic cyst 01/02/2019   Abnormal weight gain 07/31/2018   Incisional hernia, without obstruction or gangrene 04/17/2018   Other constipation 12/26/2017   History of colonic polyps 12/26/2017   Diverticulosis  12/24/2017   Elevated LDL cholesterol level 05/28/2015   Acid reflux 04/17/2015   Acquired hypothyroidism 04/17/2015  Arthritis 04/17/2015   Family history of diabetes mellitus 04/17/2015   Allergic rhinitis 04/07/2015   Anxiety 04/07/2015   Arthropathy of temporomandibular joint 04/07/2015   Spasm 04/07/2015   Climacteric 04/07/2015   Adenopathy, cervical 04/07/2015   Asthma 02/02/2015   Fibrocystic breast disease 11/26/2012    Patrina Levering PT, DPT  [facility] [address] [place], [address] Phone: [phone]   Fax:  [phone]  Name: CHARRISE LARDNER MRN: [id number] Date of Birth: Apr 11, 1946

## 2021-09-05 ENCOUNTER — Encounter: Payer: Self-pay | Admitting: Physical Therapy

## 2021-09-05 ENCOUNTER — Ambulatory Visit: Payer: Medicare HMO | Admitting: Physical Therapy

## 2021-09-05 ENCOUNTER — Other Ambulatory Visit: Payer: Self-pay

## 2021-09-05 DIAGNOSIS — M25511 Pain in right shoulder: Secondary | ICD-10-CM

## 2021-09-05 DIAGNOSIS — G8929 Other chronic pain: Secondary | ICD-10-CM

## 2021-09-05 NOTE — Therapy (Signed)
[facility] [address], [place], [address] Phone: [phone]   Fax:  [phone]  Physical Therapy Treatment  Patient Details  Name: Meredith Scott MRN: [id number] Date of Birth: [birth year] Referring Provider (PT): Brita Romp MD   Encounter Date: 09/05/2021   PT End of Session - 09/05/21 1337     Visit Number 8    Number of Visits 17    Date for PT Re-Evaluation 09/23/21    Authorization - Visit Number 6    Authorization - Number of Visits 10    PT Start Time 0900    PT Stop Time 0945    PT Time Calculation (min) 45 min    Activity Tolerance Patient tolerated treatment well    Behavior During Therapy [facility] for tasks assessed/performed             Past Medical History:  Diagnosis Date   Anxiety    Asthma    Asymptomatic varicose veins 2011   Breast screening, unspecified 2013   Colon polyp    Diffuse cystic mastopathy 2013   Hernia of abdominal wall    History of hiatal hernia    Hypertension    Hypothyroidism    Dr Eddie Dibbles   Obesity, unspecified 2013   Osteoarthritis    PONV (postoperative nausea and vomiting)    Screening for obesity 2013   Skin cancer of face 2018   squamous/ left cheek   Special screening for malignant neoplasms, colon 2013    Past Surgical History:  Procedure Laterality Date   ABDOMINAL HYSTERECTOMY  1994   BREAST CYST ASPIRATION Right 1997?   breast aspiration d/t infected milk gland   BREAST MASS EXCISION Left 2011   COLONOSCOPY  2005   done by Dr. Jamal Collin   COLONOSCOPY N/A 01/27/2015   Procedure: COLONOSCOPY;  Surgeon: Christene Lye, MD;  Location: [facility] ENDOSCOPY;  Service: Endoscopy;  Laterality: N/A;   COLONOSCOPY WITH PROPOFOL N/A 02/13/2020   Procedure: COLONOSCOPY WITH PROPOFOL;  Surgeon: Robert Bellow, MD;  Location: [facility] END[facility]COPY;  Service: Endoscopy;  Laterality: N/A;   DILATION AND CURETTAGE OF UTERUS  2007   FINE NEEDLE ASPIRATION      FOOT SURGERY Right    LIPOMA EXCISION  2010   breast bone   SKIN CANCER EXCISION Left 12/2016   under left eye, [facility]   TONGUE BIOPSY      There were no vitals filed for this visit.   Subjective Assessment - 09/05/21 0856     Subjective Pt states she has been busy over the past couple days. Has been compliant with HEP. Denies pain. Most difficulty with "reaching back and pulling it forward" which is noticed when she is in bed adjusting her pillow.    Pertinent History Pt is a 76 year old female reporting with chronic R shoulder pain. She has had chronic R shoulder pain for several years, but reports pain was exacerbated last year with insideous onset. Reports pain is more posterior lateral, and bothers the R side of her neck as well. Reports pain is a stiff ache and comes on with aggravating activities such as reaching behind her and behind the back, vacuuming, carrying things, turning her head to the R, and overhead reaching. Lowest pain 2/10 ("it's always there"); worst 9/10; current 5/10. 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 She lives at home with her husband who helps with heavy household chores, though she is able to bath, dress, feed modI with some modifications needed for behind the back reaching. Patient is R handed. Denies any n/t. Pt denies N/V, B&B changes, unexplained weight fluctuation, saddle paresthesia, fever, night sweats, or unrelenting night pain at this time.    Limitations Lifting;House hold activities;Sitting    How long can you sit comfortably? unlimited    How long can you stand comfortably? unlimited    How long can you walk comfortably? unlimited    Diagnostic tests None since 2016    Currently in Pain? No/denies             **RIGHT SHOULDER**   INTERVENTIONS     Ther-Ex Pulleys AAROM flex and abd 2 min each with near full flex with this, lacking approx 40d abd (cuing needed for true abd)  Seated row (BUE) with emphasis on scapular retractions with GTB 3 x 12- mod cueing to keep shoulders down and engage scapular retractors   Standing GH extension (BUE) YTB 3 x12 with cueing to engage scapular retractors   Standing ER RTB (BUE) 3 x12 with mod tactile cueing to bring shoulder blades down and back   Seated thoracolumbar extension (BUE) 2 x12 reps with min cueing for thoracic extension and following with eyes    Standing GH flexion (front deltoid raise) 3# DB 3 x12  Prone GH extension 1# DB 3 x 12  PB rollouts for stretching of spine and GH joint x3 minutes  Forearm plank on mat table with thick blue pad, 2 x 1 minute for isometric shoulder stability.  Manual Seated stretches x5 minutes  Bilateral cervical extensors, scalenes, upper traps and pecs    Next session: Seated thoracic rotation with red stability ball 2 x 12 reps each side  Seated DNF head nods w/ light resistance by pt hand 3 x 10  Wall Angels 3 x 12- min cueing on form to keep ands close to wall  Bilateral standing thoracic rotation ("open book") against the wall, with staggered stance - mod cueing for following hand with eyes x12 reps   -light over pressure by PT       Clinical Impression: Pt is pleasant and motivated throughout session. POC was progressed with increased multiplanar [place] strengthening and scapular stabilization interventions. Frequent cueing to integrate periscapular musculature. She continues to demonstrate limitations in thoracic mobility - plan to include more thoracic stabilization and mobilization next session in addition to strengthening.  She will continue to benefit from skilled PT interventions to address above deficits and allow pt to fulfill caregiver duties and enjoy time with her grand kids/great-grand kids.          PT Short Term Goals -  07/20/21 1525       PT SHORT TERM GOAL #1   Title Pt will be independent with HEP in order to improve strength and decrease pain in order to improve pain-free function at home and work.    Baseline 07/20/21 HEP given    Time 4    Period Weeks    Status New               PT Long Term Goals - 07/20/21 1525       PT LONG TERM GOAL #1   Title Pt will decrease worst pain as reported on  NPRS by at least 3 points in order to demonstrate clinically significant reduction in pain.    Baseline 08/20/21 9/10    Time 8    Period Weeks    Status New      PT LONG TERM GOAL #2   Title Pt will increase gross periscapular strength to at least 4/5 MMT grade bilat in order to demonstrate improvement in strength and function    Baseline 07/20/21 R/L T scap retractors 4-/4; Y lower trap 3+/41    Time 8    Period Weeks    Status New      PT LONG TERM GOAL #3   Title Pt will demonstrate full active shoulder ROM in order to complete household ADLs and self care ADLs    Baseline 07/20/21 flex 141d; abd 122d; ER C7; IR T 12    Time 8    Period Weeks    Status New      PT LONG TERM GOAL #4   Title Patient will increase FOTO score to 64 to demonstrate predicted increase in functional mobility to complete ADLs    Baseline 07/20/21 54    Time 8    Period Weeks    Status New                   Plan - 09/05/21 1521     Clinical Impression Statement Pt is pleasant and motivated throughout session. POC was progressed with increased multiplanar [place] strengthening and scapular stabilization interventions. Frequent cueing to integrate periscapular musculature. She continues to demonstrate limitations in thoracic mobility - plan to include more thoracic stabilization and mobilization next session in addition to strengthening.  She will continue to benefit from skilled PT interventions to address above deficits and allow pt to fulfill caregiver duties and enjoy time with her grand kids/great-grand kids.     Personal Factors and Comorbidities Comorbidity 2;Fitness;Past/Current Experience;Time since onset of injury/illness/exacerbation    Comorbidities OA, HTN , hypothyroid    Examination-Activity Limitations Reach Overhead;Lift;Carry;Bed Mobility;Dressing;Hygiene/Grooming    Examination-Participation Restrictions Cleaning;Community Activity;Occupation;Meal Prep    Stability/Clinical Decision Making Evolving/Moderate complexity    Rehab Potential Good    PT Frequency 2x / week    PT Duration 8 weeks    PT Treatment/Interventions ADLs/Self Care Home Management;Cryotherapy;Electrical Stimulation;Moist Heat;Traction;Ultrasound;DME Instruction;Aquatic Therapy;Fluidtherapy;Therapeutic exercise;Functional mobility training;Neuromuscular re-education;Manual techniques;Passive range of motion;Spinal Manipulations;Joint Manipulations;Dry needling;Taping;Splinting;Patient/family education    PT Next Visit Plan increased scapulothoracic mobility and parascapular strengthening    PT Home Exercise Plan UT and levator stretch, thoracic ext over towel with pec stretch, rows BTB, and ER with RTB    Consulted and Agree with Plan of Care Patient             Patient will benefit from skilled therapeutic intervention in order to improve the following deficits and impairments:  Decreased activity tolerance, Decreased endurance, Decreased range of motion, Decreased strength, Increased fascial restricitons, Impaired UE functional use, Pain, Improper body mechanics, Postural dysfunction, Impaired flexibility, Impaired tone, Decreased coordination, Decreased mobility, Decreased safety awareness, Increased muscle spasms  Visit Diagnosis: Chronic right shoulder pain     Problem List Patient Active Problem List   Diagnosis Date Noted   Chronic right shoulder pain 06/20/2021   Chronic idiopathic constipation 04/21/2021   Pelvic pressure in female 08/12/2020   Family history of systemic lupus erythematosus 06/14/2020    Family history of arthritis 06/14/2020   Osteoarthritis of knee 06/06/2019   Sprain of knee 06/06/2019   Pancreatic cyst 01/02/2019  Abnormal weight gain 07/31/2018   Incisional hernia, without obstruction or gangrene 04/17/2018   Other constipation 12/26/2017   History of colonic polyps 12/26/2017   Diverticulosis 12/24/2017   Elevated LDL cholesterol level 05/28/2015   Acid reflux 04/17/2015   Acquired hypothyroidism 04/17/2015   Arthritis 04/17/2015   Family history of diabetes mellitus 04/17/2015   Allergic rhinitis 04/07/2015   Anxiety 04/07/2015   Arthropathy of temporomandibular joint 04/07/2015   Spasm 04/07/2015   Climacteric 04/07/2015   Adenopathy, cervical 04/07/2015   Asthma 02/02/2015   Fibrocystic breast disease 11/26/2012    Patrina Levering PT, DPT   [facility] [address], [place], [address] Phone: [phone]   Fax:  [phone]  Name: LILLYEN SCHOW MRN: [id number] Date of Birth: [birth year]

## 2021-09-07 ENCOUNTER — Other Ambulatory Visit: Payer: Self-pay

## 2021-09-07 ENCOUNTER — Ambulatory Visit: Payer: Medicare HMO | Admitting: Physical Therapy

## 2021-09-07 DIAGNOSIS — M25511 Pain in right shoulder: Secondary | ICD-10-CM | POA: Diagnosis not present

## 2021-09-07 DIAGNOSIS — G8929 Other chronic pain: Secondary | ICD-10-CM

## 2021-09-07 DIAGNOSIS — M180 Bilateral primary osteoarthritis of first carpometacarpal joints: Secondary | ICD-10-CM | POA: Diagnosis not present

## 2021-09-07 DIAGNOSIS — E559 Vitamin D deficiency, unspecified: Secondary | ICD-10-CM | POA: Diagnosis not present

## 2021-09-07 DIAGNOSIS — R768 Other specified abnormal immunological findings in serum: Secondary | ICD-10-CM | POA: Diagnosis not present

## 2021-09-07 NOTE — Therapy (Signed)
[place] [facility] [address], [place], [address] Phone: [phone]   Fax:  [phone]  Physical Therapy Treatment  Patient Details  Name: Meredith Scott MRN: [id number] Date of Birth: December 17, 1945 Referring Provider (PT): Brita Romp MD   Encounter Date: 09/07/2021   PT End of Session - 09/07/21 1651     Visit Number 9    Number of Visits 17    Date for PT Re-Evaluation 09/23/21    Authorization - Visit Number 6    Authorization - Number of Visits 10    PT Start Time 1600    PT Stop Time 0938    PT Time Calculation (min) 45 min    Activity Tolerance Patient tolerated treatment well    Behavior During Therapy [facility] for tasks assessed/performed             Past Medical History:  Diagnosis Date   Anxiety    Asthma    Asymptomatic varicose veins 2011   Breast screening, unspecified 2013   Colon polyp    Diffuse cystic mastopathy 2013   Hernia of abdominal wall    History of hiatal hernia    Hypertension    Hypothyroidism    Dr Eddie Dibbles   Obesity, unspecified 2013   Osteoarthritis    PONV (postoperative nausea and vomiting)    Screening for obesity 2013   Skin cancer of face 2018   squamous/ left cheek   Special screening for malignant neoplasms, colon 2013    Past Surgical History:  Procedure Laterality Date   ABDOMINAL HYSTERECTOMY  1994   BREAST CYST ASPIRATION Right 1997?   breast aspiration d/t infected milk gland   BREAST MASS EXCISION Left 2011   COLONOSCOPY  2005   done by Dr. Jamal Collin   COLONOSCOPY N/A 01/27/2015   Procedure: COLONOSCOPY;  Surgeon: Christene Lye, MD;  Location: [facility] ENDOSCOPY;  Service: Endoscopy;  Laterality: N/A;   COLONOSCOPY WITH PROPOFOL N/A 02/13/2020   Procedure: COLONOSCOPY WITH PROPOFOL;  Surgeon: Robert Bellow, MD;  Location: [facility] ENDOSCOPY;  Service: Endoscopy;  Laterality: N/A;   DILATION AND CURETTAGE OF UTERUS  2007   FINE NEEDLE ASPIRATION      FOOT SURGERY Right    LIPOMA EXCISION  2010   breast bone   SKIN CANCER EXCISION Left 12/2016   under left eye, [facility]   TONGUE BIOPSY      There were no vitals filed for this visit.   Subjective Assessment - 09/07/21 1604     Subjective Pt states she was sore after the past session; soreness did not go into the next day. Denies pain upon arrival. States she saw her rheumatologist today who states she may have arthritis in the cervical spine; no current plans for treatment however rheumatologist will continue to follow.    Pertinent History Pt is a 76 year old female reporting with chronic R shoulder pain. She has had chronic R shoulder pain for several years, but reports pain was exacerbated last year with insideous onset. Reports pain is more posterior lateral, and bothers the R side of her neck as well. Reports pain is a stiff ache and comes on with aggravating activities such as reaching behind her and behind the back, vacuuming, carrying things, turning her head to the R, and overhead reaching. Lowest pain 2/10 ("it's always there"); worst 9/10; current 5/10. 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 She lives at home with her husband who helps with heavy household chores, though she is able to bath, dress, feed modI with some modifications needed for behind the back reaching. Patient is R handed. Denies any n/t. Pt denies N/V, B&B changes, unexplained weight fluctuation, saddle paresthesia, fever, night sweats, or unrelenting night pain at this time.    Limitations Lifting;House hold activities;Sitting    How long can you sit comfortably? unlimited    How long can you stand comfortably? unlimited    How long can you walk comfortably? unlimited    Diagnostic tests None since 2016    Currently in  Pain? No/denies              **RIGHT SHOULDER**   INTERVENTIONS    Ther-Ex UE ergometer, 5 minutes on level 4 switching direction each minute  Wall Angels 3 x 12- min cueing on form to keep ands close to wall   Postural awareness standing against wall, emphasis on keeping contact with B shoulders, back and encouraging cervical retraction, 3 x1 minute.     Seated low row at [facility] 15# with emphasis on scapular retractions 3 x 10  "Open book" in side lying, 2 x10 each side. PT provided gentle overpressure.   GH flexion (front deltoid raise) 3# DB 3 x10  GH abduction (lateral deltoid raise) 2# DB 3 x10   Standing GH extension (BUE) RTB 3 x10 with cueing to engage scapular retractors    Standing ER YTB (doubled), 3 x10    Rhythmic stabilization to R shoulder, 2 x1 minute     Next session: Seated thoracic rotation with red stability ball 2 x 12 reps each side  Seated DNF head nods w/ light resistance by pt hand 3 x 10  Bilateral standing thoracic rotation ("open book") against the wall, with staggered stance - mod cueing for following hand with eyes x12 reps   -light over pressure by PT Pulleys AAROM flex and abd 2 min each with near full flex with this, lacking approx 40d abd (cuing needed for true abd) Seated stretches - bilateral cervical extensors, scalenes, upper traps and pecs PB rollouts for stretching of spine and GH joint x3 minutes   **Aggravating activities: reaching behind her [facility] ext) and behind the back, vacuuming, carrying things, turning her head to the R, and overhead reaching.**   Clinical Impression: Pt is pleasant and motivated throughout session. POC was maintained with global GH/periscapular strengthening and stabilization with pt reporting soreness with appropriate recovery time following last session. Frequent cueing to integrate periscapular musculature. Increased thoracic mobility interventions were performed this date. She states she would like to  pass on TDN today but would like to consider it next session.  She will continue to benefit from skilled PT interventions to address above deficits and allow pt to fulfill caregiver duties and enjoy time with her grand kids/great-grand kids.          PT Short Term Goals - 07/20/21 1525       PT SHORT TERM GOAL #1   Title Pt will be independent with HEP in order to improve strength and decrease pain in order to improve pain-free function at home and work.    Baseline 07/20/21 HEP given    Time 4    Period Weeks    Status New  PT Long Term Goals - 07/20/21 1525       PT LONG TERM GOAL #1   Title Pt will decrease worst pain as reported on NPRS by at least 3 points in order to demonstrate clinically significant reduction in pain.    Baseline 08/20/21 9/10    Time 8    Period Weeks    Status New      PT LONG TERM GOAL #2   Title Pt will increase gross periscapular strength to at least 4/5 MMT grade bilat in order to demonstrate improvement in strength and function    Baseline 07/20/21 R/L T scap retractors 4-/4; Y lower trap 3+/41    Time 8    Period Weeks    Status New      PT LONG TERM GOAL #3   Title Pt will demonstrate full active shoulder ROM in order to complete household ADLs and self care ADLs    Baseline 07/20/21 flex 141d; abd 122d; ER C7; IR T 12    Time 8    Period Weeks    Status New      PT LONG TERM GOAL #4   Title Patient will increase FOTO score to 64 to demonstrate predicted increase in functional mobility to complete ADLs    Baseline 07/20/21 54    Time 8    Period Weeks    Status New                   Plan - 09/07/21 1652     Clinical Impression Statement Pt is pleasant and motivated throughout session. POC was maintained with global GH/periscapular strengthening and stabilization with pt reporting soreness with appropriate recovery time following last session. Frequent cueing to integrate periscapular musculature. Increased  thoracic mobility interventions were performed this date. She states she would like to pass on TDN today but would like to consider it next session.  She will continue to benefit from skilled PT interventions to address above deficits and allow pt to fulfill caregiver duties and enjoy time with her grand kids/great-grand kids.    Personal Factors and Comorbidities Comorbidity 2;Fitness;Past/Current Experience;Time since onset of injury/illness/exacerbation    Comorbidities OA, HTN , hypothyroid    Examination-Activity Limitations Reach Overhead;Lift;Carry;Bed Mobility;Dressing;Hygiene/Grooming    Examination-Participation Restrictions Cleaning;Community Activity;Occupation;Meal Prep    Stability/Clinical Decision Making Evolving/Moderate complexity    Rehab Potential Good    PT Frequency 2x / week    PT Duration 8 weeks    PT Treatment/Interventions ADLs/Self Care Home Management;Cryotherapy;Electrical Stimulation;Moist Heat;Traction;Ultrasound;DME Instruction;Aquatic Therapy;Fluidtherapy;Therapeutic exercise;Functional mobility training;Neuromuscular re-education;Manual techniques;Passive range of motion;Spinal Manipulations;Joint Manipulations;Dry needling;Taping;Splinting;Patient/family education    PT Next Visit Plan increased scapulothoracic mobility and parascapular strengthening    PT Home Exercise Plan UT and levator stretch, thoracic ext over towel with pec stretch, rows BTB, and ER with RTB    Consulted and Agree with Plan of Care Patient             Patient will benefit from skilled therapeutic intervention in order to improve the following deficits and impairments:  Decreased activity tolerance, Decreased endurance, Decreased range of motion, Decreased strength, Increased fascial restricitons, Impaired UE functional use, Pain, Improper body mechanics, Postural dysfunction, Impaired flexibility, Impaired tone, Decreased coordination, Decreased mobility, Decreased safety awareness,  Increased muscle spasms  Visit Diagnosis: Chronic right shoulder pain     Problem List Patient Active Problem List   Diagnosis Date Noted   Chronic right shoulder pain 06/20/2021   Chronic idiopathic constipation 04/21/2021  Pelvic pressure in female 08/12/2020   Family history of systemic lupus erythematosus 06/14/2020   Family history of arthritis 06/14/2020   Osteoarthritis of knee 06/06/2019   Sprain of knee 06/06/2019   Pancreatic cyst 01/02/2019   Abnormal weight gain 07/31/2018   Incisional hernia, without obstruction or gangrene 04/17/2018   Other constipation 12/26/2017   History of colonic polyps 12/26/2017   Diverticulosis 12/24/2017   Elevated LDL cholesterol level 05/28/2015   Acid reflux 04/17/2015   Acquired hypothyroidism 04/17/2015   Arthritis 04/17/2015   Family history of diabetes mellitus 04/17/2015   Allergic rhinitis 04/07/2015   Anxiety 04/07/2015   Arthropathy of temporomandibular joint 04/07/2015   Spasm 04/07/2015   Climacteric 04/07/2015   Adenopathy, cervical 04/07/2015   Asthma 02/02/2015   Fibrocystic breast disease 11/26/2012     Patrina Levering PT, DPT   [facility] [address] [place], [address] Phone: [phone]   Fax:  [phone]  Name: Meredith Scott MRN: [id number] Date of Birth: 05/26/1946

## 2021-09-12 ENCOUNTER — Encounter: Payer: Self-pay | Admitting: Physical Therapy

## 2021-09-12 ENCOUNTER — Ambulatory Visit: Payer: Medicare HMO | Admitting: Physical Therapy

## 2021-09-12 ENCOUNTER — Other Ambulatory Visit: Payer: Self-pay

## 2021-09-12 DIAGNOSIS — G8929 Other chronic pain: Secondary | ICD-10-CM | POA: Diagnosis not present

## 2021-09-12 DIAGNOSIS — M25511 Pain in right shoulder: Secondary | ICD-10-CM | POA: Diagnosis not present

## 2021-09-12 NOTE — Therapy (Signed)
[place] [facility] [address] [place], [address] Phone: [phone]   Fax:  [phone]  Physical Therapy Treatment/Physical Therapy Progress Note   Dates of reporting period  07/20/21   to   09/12/21.  Patient Details  Name: Meredith Scott MRN: [id number] Date of Birth: January 26, 1946 Referring Provider (PT): Brita Romp MD   Encounter Date: 09/12/2021   PT End of Session - 09/12/21 1004     Visit Number 10    Number of Visits 17    Date for PT Re-Evaluation 09/23/21    Progress Note Due on Visit 43    PT Start Time 0905    PT Stop Time 0950    PT Time Calculation (min) 45 min    Activity Tolerance Patient tolerated treatment well    Behavior During Therapy [facility] for tasks assessed/performed             Past Medical History:  Diagnosis Date   Anxiety    Asthma    Asymptomatic varicose veins 2011   Breast screening, unspecified 2013   Colon polyp    Diffuse cystic mastopathy 2013   Hernia of abdominal wall    History of hiatal hernia    Hypertension    Hypothyroidism    Dr Eddie Dibbles   Obesity, unspecified 2013   Osteoarthritis    PONV (postoperative nausea and vomiting)    Screening for obesity 2013   Skin cancer of face 2018   squamous/ left cheek   Special screening for malignant neoplasms, colon 2013    Past Surgical History:  Procedure Laterality Date   ABDOMINAL HYSTERECTOMY  1994   BREAST CYST ASPIRATION Right 1997?   breast aspiration d/t infected milk gland   BREAST MASS EXCISION Left 2011   COLONOSCOPY  2005   done by Dr. Jamal Collin   COLONOSCOPY N/A 01/27/2015   Procedure: COLONOSCOPY;  Surgeon: Christene Lye, MD;  Location: [facility] END[facility]COPY;  Service: Endoscopy;  Laterality: N/A;   COLONOSCOPY WITH PROPOFOL N/A 02/13/2020   Procedure: COLONOSCOPY WITH PROPOFOL;  Surgeon: Robert Bellow, MD;  Location: [facility];  Service: Endoscopy;  Laterality: N/A;   DILATION AND  CURETTAGE OF UTERUS  2007   FINE NEEDLE ASPIRATION     FOOT SURGERY Right    LIPOMA EXCISION  2010   breast bone   SKIN CANCER EXCISION Left 12/2016   under left eye, [facility]   TONGUE BIOPSY      There were no vitals filed for this visit.   Subjective Assessment - 09/12/21 1002     Subjective Pt states she is doing well today. Denies current pain; does endorse she continues ot have pain with reaching behind her [facility] extension). Compliant with HEP.    Pertinent History Pt is a 76 year old female reporting with chronic R shoulder pain. She has had chronic R shoulder pain for several years, but reports pain was exacerbated last year with insideous onset. Reports pain is more posterior lateral, and bothers the R side of her neck as well. Reports pain is a stiff ache and comes on with aggravating activities such as reaching behind her and behind the back, vacuuming, carrying things, turning her head to the R, and overhead reaching. Lowest pain 2/10 ("it's always there"); worst 9/10; current 5/10. 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 She lives at home with her husband who helps with heavy household chores, though she is able to bath, dress, feed modI with some modifications needed for behind the back reaching. Patient is R handed. Denies any n/t. Pt denies N/V, B&B changes, unexplained weight fluctuation, saddle paresthesia, fever, night sweats, or unrelenting night pain at this time.    Limitations Lifting;House hold activities;Sitting    How long can you sit comfortably? unlimited    How long can you stand comfortably? unlimited    How long can you walk comfortably? unlimited    Diagnostic tests None since 2016    Currently in Pain? No/denies              **RIGHT SHOULDER**   INTERVENTIONS     Manual Therapy Trigger Point Dry Needling (TDN), unbilled. Education performed with patient regarding potential benefit of TDN. Reviewed precautions and risks with patient. Pt provided verbal consent to treatment. TDN performed with 0.30 x 60 single needle placements with local twitch response (LTR). Pistoning technique utilized. Improved pain-free motion and improved muscle recruitment following intervention. Muscles targeted: bilateral upper traps   Ther-Ex Pulleys AAROM flex and abd, 2 min each    Wall Angels 3 x 12, VC cueing on form to keep hands close to wall   Postural awareness standing against wall, emphasis on keeping contact with B shoulders, back and encouraging cervical retraction, 3 x1 minute.   Bilateral UT stretch 3 x 30 sec  GH flexion (front deltoid raise) 3# DB 3 x10   GH abduction (lateral deltoid raise) 2# DB 3 x10    Seated low row at [facility] 20# with emphasis on scapular retractions 3 x 10       Next session: Seated DNF head nods w/ light resistance by pt hand 3 x 10  Seated stretches - bilateral cervical extensors, scalenes, upper traps and pecs PB rollouts for stretching of spine and GH joint x3 minutes **Aggravating activities**: reaching behind her [facility] ext) and behind the back, vacuuming, carrying things, turning her head to the R, and overhead reaching.**      Clinical Impression: Pt is pleasant and motivated throughout session. Dry needling was utilized this session in bilateral upper traps for pain relief and improved muscle activation. TDN was followed by postural restoration exercises and strengthening of the [facility] joint. Evaluation of goals indicates pt is making good progress with strength and ROM. Subjectively, pt FOTO score and maximum pain levels are also moving in the right direction. She will continue to benefit from skilled PT interventions to address above deficits and allow pt to fulfill caregiver duties and enjoy time with her grand  kids/great-grand kids.  Patient's condition has the potential to improve in response to therapy. Maximum improvement is yet to be obtained. The anticipated improvement is attainable and reasonable in a generally predictable time.            PT Short Term Goals - 09/12/21 1022       PT SHORT TERM GOAL #1   Title Pt will be independent with HEP in order to improve strength and decrease pain in order to improve pain-free function at home and work.    Baseline 07/20/21 HEP given; 2/27 continually progressing HEP with pt adherance    Time 4    Period Weeks    Status Achieved  PT Long Term Goals - 09/12/21 1022       PT LONG TERM GOAL #1   Title Pt will decrease worst pain as reported on NPRS by at least 3 points in order to demonstrate clinically significant reduction in pain.    Baseline 08/20/21:9/10, 09/12/21:8/10    Time 8    Period Weeks    Status On-going    Target Date 09/23/21      PT LONG TERM GOAL #2   Title Pt will increase gross periscapular strength to at least 4/5 MMT grade bilat in order to demonstrate improvement in strength and function    Baseline 07/20/21 R/L T scap retractors 4-/4; Y lower trap 3+/5; 09/12/21: T scap retractors 4-/5, Y low trap 4-/5    Time 8    Period Weeks    Status On-going    Target Date 09/23/21      PT LONG TERM GOAL #3   Title Pt will demonstrate full active shoulder ROM in order to complete household ADLs and self care ADLs    Baseline 07/20/21 flex 141d; abd 122d; ER C7; IR T 12; 09/12/21: F WFL, ABD 141, ER T2, IR T9    Time 8    Period Weeks    Status On-going    Target Date 09/23/21      PT LONG TERM GOAL #4   Title Patient will increase FOTO score to 64 to demonstrate predicted increase in functional mobility to complete ADLs    Baseline 07/20/21 54, 09/12/21: 59    Time 8    Period Weeks    Status On-going    Target Date 09/23/21                   Plan - 09/12/21 1021     Clinical Impression  Statement Pt is pleasant and motivated throughout session. Dry needling was utilized this session in bilateral upper traps for pain relief and improved muscle activation. TDN was followed by postural restoration exercises and strengthening of the [facility] joint. Evaluation of goals indicates pt is making good progress with strength and ROM. Subjectively, pt FOTO score and maximum pain levels are also moving in the right direction. She will continue to benefit from skilled PT interventions to address above deficits and allow pt to fulfill caregiver duties and enjoy time with her grand kids/great-grand kids.    Patient's condition has the potential to improve in response to therapy. Maximum improvement is yet to be obtained. The anticipated improvement is attainable and reasonable in a generally predictable time.    Personal Factors and Comorbidities Comorbidity 2;Fitness;Past/Current Experience;Time since onset of injury/illness/exacerbation    Comorbidities OA, HTN , hypothyroid    Examination-Activity Limitations Reach Overhead;Lift;Carry;Bed Mobility;Dressing;Hygiene/Grooming    Examination-Participation Restrictions Cleaning;Community Activity;Occupation;Meal Prep    Stability/Clinical Decision Making Evolving/Moderate complexity    Rehab Potential Good    PT Frequency 2x / week    PT Duration 8 weeks    PT Treatment/Interventions ADLs/Self Care Home Management;Cryotherapy;Electrical Stimulation;Moist Heat;Traction;Ultrasound;DME Instruction;Aquatic Therapy;Fluidtherapy;Therapeutic exercise;Functional mobility training;Neuromuscular re-education;Manual techniques;Passive range of motion;Spinal Manipulations;Joint Manipulations;Dry needling;Taping;Splinting;Patient/family education    PT Next Visit Plan increased scapulothoracic mobility and parascapular strengthening    PT Home Exercise Plan UT and levator stretch, thoracic ext over towel with pec stretch, rows BTB, and ER with RTB    Consulted and Agree  with Plan of Care Patient             Patient will benefit from skilled therapeutic intervention in  order to improve the following deficits and impairments:  Decreased activity tolerance, Decreased endurance, Decreased range of motion, Decreased strength, Increased fascial restricitons, Impaired UE functional use, Pain, Improper body mechanics, Postural dysfunction, Impaired flexibility, Impaired tone, Decreased coordination, Decreased mobility, Decreased safety awareness, Increased muscle spasms  Visit Diagnosis: Chronic right shoulder pain     Problem List Patient Active Problem List   Diagnosis Date Noted   Chronic right shoulder pain 06/20/2021   Chronic idiopathic constipation 04/21/2021   Pelvic pressure in female 08/12/2020   Family history of systemic lupus erythematosus 06/14/2020   Family history of arthritis 06/14/2020   Osteoarthritis of knee 06/06/2019   Sprain of knee 06/06/2019   Pancreatic cyst 01/02/2019   Abnormal weight gain 07/31/2018   Incisional hernia, without obstruction or gangrene 04/17/2018   Other constipation 12/26/2017   History of colonic polyps 12/26/2017   Diverticulosis 12/24/2017   Elevated LDL cholesterol level 05/28/2015   Acid reflux 04/17/2015   Acquired hypothyroidism 04/17/2015   Arthritis 04/17/2015   Family history of diabetes mellitus 04/17/2015   Allergic rhinitis 04/07/2015   Anxiety 04/07/2015   Arthropathy of temporomandibular joint 04/07/2015   Spasm 04/07/2015   Climacteric 04/07/2015   Adenopathy, cervical 04/07/2015   Asthma 02/02/2015   Fibrocystic breast disease 11/26/2012    Patrina Levering PT, DPT   [place] [address], [place], [address] Phone: [phone]   Fax:  [phone]  Name: Meredith Scott MRN: [id number] Date of Birth: 1946/03/29

## 2021-09-13 DIAGNOSIS — H6123 Impacted cerumen, bilateral: Secondary | ICD-10-CM | POA: Diagnosis not present

## 2021-09-13 DIAGNOSIS — R1314 Dysphagia, pharyngoesophageal phase: Secondary | ICD-10-CM | POA: Diagnosis not present

## 2021-09-13 DIAGNOSIS — H903 Sensorineural hearing loss, bilateral: Secondary | ICD-10-CM | POA: Diagnosis not present

## 2021-09-13 DIAGNOSIS — K219 Gastro-esophageal reflux disease without esophagitis: Secondary | ICD-10-CM | POA: Diagnosis not present

## 2021-09-14 ENCOUNTER — Ambulatory Visit: Payer: Medicare HMO | Attending: Family Medicine | Admitting: Physical Therapy

## 2021-09-14 ENCOUNTER — Other Ambulatory Visit: Payer: Self-pay

## 2021-09-14 ENCOUNTER — Encounter: Payer: Self-pay | Admitting: Physical Therapy

## 2021-09-14 DIAGNOSIS — M25511 Pain in right shoulder: Secondary | ICD-10-CM | POA: Diagnosis not present

## 2021-09-14 DIAGNOSIS — G8929 Other chronic pain: Secondary | ICD-10-CM | POA: Insufficient documentation

## 2021-09-14 NOTE — Therapy (Signed)
[facility] [address], [place], [address] Phone: [phone]   Fax:  [phone]  Physical Therapy Treatment  Patient Details  Name: Meredith Scott MRN: [id number] Date of Birth: 17-Nov-1945 Referring Provider (PT): Brita Romp MD   Encounter Date: 09/14/2021   PT End of Session - 09/14/21 0947     Visit Number 11    Number of Visits 17    Date for PT Re-Evaluation 09/23/21    Progress Note Due on Visit 108    PT Start Time 0815    PT Stop Time 0900    PT Time Calculation (min) 45 min    Activity Tolerance Patient tolerated treatment well    Behavior During Therapy [facility] for tasks assessed/performed             Past Medical History:  Diagnosis Date   Anxiety    Asthma    Asymptomatic varicose veins 2011   Breast screening, unspecified 2013   Colon polyp    Diffuse cystic mastopathy 2013   Hernia of abdominal wall    History of hiatal hernia    Hypertension    Hypothyroidism    Dr Eddie Dibbles   Obesity, unspecified 2013   Osteoarthritis    PONV (postoperative nausea and vomiting)    Screening for obesity 2013   Skin cancer of face 2018   squamous/ left cheek   Special screening for malignant neoplasms, colon 2013    Past Surgical History:  Procedure Laterality Date   ABDOMINAL HYSTERECTOMY  1994   BREAST CYST ASPIRATION Right 1997?   breast aspiration d/t infected milk gland   BREAST MASS EXCISION Left 2011   COLONOSCOPY  2005   done by Dr. Jamal Collin   COLONOSCOPY N/A 01/27/2015   Procedure: COLONOSCOPY;  Surgeon: Christene Lye, MD;  Location: [facility] ENDOSCOPY;  Service: Endoscopy;  Laterality: N/A;   COLONOSCOPY WITH PROPOFOL N/A 02/13/2020   Procedure: COLONOSCOPY WITH PROPOFOL;  Surgeon: Robert Bellow, MD;  Location: [facility] ENDOSCOPY;  Service: Endoscopy;  Laterality: N/A;   DILATION AND CURETTAGE OF UTERUS  2007   FINE NEEDLE ASPIRATION     FOOT SURGERY Right    LIPOMA EXCISION   2010   breast bone   SKIN CANCER EXCISION Left 12/2016   under left eye, [facility]   TONGUE BIOPSY      There were no vitals filed for this visit.   Subjective Assessment - 09/14/21 0818     Subjective Pt states she is doing well today. Denies current pain; states she had no pain following last session and that she has had decreased tension in her upper traps. Is compliant with HEP and enjoys doing open book stretch.    Pertinent History Pt is a 76 year old female reporting with chronic R shoulder pain. She has had chronic R shoulder pain for several years, but reports pain was exacerbated last year with insideous onset. Reports pain is more posterior lateral, and bothers the R side of her neck as well. Reports pain is a stiff ache and comes on with aggravating activities such as reaching behind her and behind the back, vacuuming, carrying things, turning her head to the R, and overhead reaching. Lowest pain 2/10 ("it's always there"); worst 9/10; current 5/10. 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 She lives at home with her husband who helps with heavy household chores, though she is able to bath, dress, feed modI with some modifications needed for behind the back reaching. Patient is R handed. Denies any n/t. Pt denies N/V, B&B changes, unexplained weight fluctuation, saddle paresthesia, fever, night sweats, or unrelenting night pain at this time.    Limitations Lifting;House hold activities;Sitting    How long can you sit comfortably? unlimited    How long can you stand comfortably? unlimited    How long can you walk comfortably? unlimited    Diagnostic tests None since 2016    Currently in Pain? No/denies              **RIGHT SHOULDER**    INTERVENTIONS    Manual Therapy Trigger Point  Dry Needling (TDN), unbilled. Education performed with patient regarding potential benefit of TDN. Reviewed precautions and risks with patient. Pt provided verbal consent to treatment. TDN performed with 0.30 x 60 single needle placements with local twitch response (LTR). Pistoning technique utilized. Improved pain-free motion and improved muscle recruitment following intervention. Muscles targeted: right upper trap   STM to B upper traps within and following TDN x3 minutes   Seated stretches - bilateral cervical extensors, R cervical rotators,  scalenes, upper traps and pecs    Ther-Ex UE ergometer, 5 minutes on level 3. 2.5 min forward, 2.5 min backwards.    Seated low row at [place] only 10# with emphasis on scapular retraction 3 x 10  GH extension GTB, 3x10  Iron cross YTB 3x10  GH ER YTB 3x10  Farmer carries 8# DB in each hand, 3x8ft    Shoulder shrugs 5# DB RUE, 8# DB LUE, 3x10.   - pain/pulling in RUE with 8#   MedBridge HEP: [id number] Updated and performed HEP. Handout provided. Exercises Standing Shoulder Flexion to 90 Degrees with Dumbbells - 1 x daily - 3 x weekly - 3 sets - 10 reps Shoulder Abduction with Dumbbells - Thumbs Up - 1 x daily - 3 x weekly - 3 sets - 10 reps Standing Shoulder Horizontal Abduction with Resistance - 1 x daily - 3 x weekly - 3 sets - 10 reps Standing Shoulder External Rotation with Resistance - 1 x daily - 3 x weekly - 3 sets - 10 reps Standing Shoulder Shrugs with Dumbbells - 1 x daily - 3 x weekly - 3 sets - 10 reps   **Aggravating activities**: reaching behind her [facility] ext) and behind the back, vacuuming, carrying things, turning her head to the R, and overhead reaching.**       Clinical Impression: Pt is pleasant and motivated throughout session. Dry needling was implemented again this session in right upper trap only for pain relief and decreased tension. TDN was followed by postural restoration exercises and strengthening of the [facility]  joint. Upper trap was of focus this date with stretching and strengthening. Mare Ferrari carries performed with education on importance of posture and setting shoulder to avoid shoulder depression. HEP was updated and new handout provided She will continue to benefit from skilled PT interventions to address above deficits and allow pt to fulfill caregiver duties and enjoy time with her grand kids/great-grand kids.            PT Short Term Goals - 09/12/21 1022       PT SHORT TERM GOAL #1   Title Pt will be independent with HEP  in order to improve strength and decrease pain in order to improve pain-free function at home and work.    Baseline 07/20/21 HEP given; 2/27 continually progressing HEP with pt adherance    Time 4    Period Weeks    Status Achieved               PT Long Term Goals - 09/12/21 1022       PT LONG TERM GOAL #1   Title Pt will decrease worst pain as reported on NPRS by at least 3 points in order to demonstrate clinically significant reduction in pain.    Baseline 08/20/21:9/10, 09/12/21:8/10    Time 8    Period Weeks    Status On-going    Target Date 09/23/21      PT LONG TERM GOAL #2   Title Pt will increase gross periscapular strength to at least 4/5 MMT grade bilat in order to demonstrate improvement in strength and function    Baseline 07/20/21 R/L T scap retractors 4-/4; Y lower trap 3+/5; 09/12/21: T scap retractors 4-/5, Y low trap 4-/5    Time 8    Period Weeks    Status On-going    Target Date 09/23/21      PT LONG TERM GOAL #3   Title Pt will demonstrate full active shoulder ROM in order to complete household ADLs and self care ADLs    Baseline 07/20/21 flex 141d; abd 122d; ER C7; IR T 12; 09/12/21: F WFL, ABD 141, ER T2, IR T9    Time 8    Period Weeks    Status On-going    Target Date 09/23/21      PT LONG TERM GOAL #4   Title Patient will increase FOTO score to 64 to demonstrate predicted increase in functional mobility to complete ADLs     Baseline 07/20/21 54, 09/12/21: 59    Time 8    Period Weeks    Status On-going    Target Date 09/23/21                   Plan - 09/14/21 0948     Clinical Impression Statement Pt is pleasant and motivated throughout session. Dry needling was implemented again this session in right upper trap only for pain relief and decreased tension. TDN was followed by postural restoration exercises and strengthening of the [facility] joint. Upper trap was of focus this date with stretching and strengthening. Mare Ferrari carries performed with education on importance of posture and setting shoulder to avoid shoulder depression. HEP was updated and new handout provided She will continue to benefit from skilled PT interventions to address above deficits and allow pt to fulfill caregiver duties and enjoy time with her grand kids/great-grand kids.    Personal Factors and Comorbidities Comorbidity 2;Fitness;Past/Current Experience;Time since onset of injury/illness/exacerbation    Comorbidities OA, HTN , hypothyroid    Examination-Activity Limitations Reach Overhead;Lift;Carry;Bed Mobility;Dressing;Hygiene/Grooming    Examination-Participation Restrictions Cleaning;Community Activity;Occupation;Meal Prep    Stability/Clinical Decision Making Evolving/Moderate complexity    Rehab Potential Good    PT Frequency 2x / week    PT Duration 8 weeks    PT Treatment/Interventions ADLs/Self Care Home Management;Cryotherapy;Electrical Stimulation;Moist Heat;Traction;Ultrasound;DME Instruction;Aquatic Therapy;Fluidtherapy;Therapeutic exercise;Functional mobility training;Neuromuscular re-education;Manual techniques;Passive range of motion;Spinal Manipulations;Joint Manipulations;Dry needling;Taping;Splinting;Patient/family education    PT Next Visit Plan increased scapulothoracic mobility and periscapular strengthening    PT Home Exercise Plan [id number]    Consulted and Agree with Plan of Care Patient  Patient  will benefit from skilled therapeutic intervention in order to improve the following deficits and impairments:  Decreased activity tolerance, Decreased endurance, Decreased range of motion, Decreased strength, Increased fascial restricitons, Impaired UE functional use, Pain, Improper body mechanics, Postural dysfunction, Impaired flexibility, Impaired tone, Decreased coordination, Decreased mobility, Decreased safety awareness, Increased muscle spasms  Visit Diagnosis: Chronic right shoulder pain     Problem List Patient Active Problem List   Diagnosis Date Noted   Chronic right shoulder pain 06/20/2021   Chronic idiopathic constipation 04/21/2021   Pelvic pressure in female 08/12/2020   Family history of systemic lupus erythematosus 06/14/2020   Family history of arthritis 06/14/2020   Osteoarthritis of knee 06/06/2019   Sprain of knee 06/06/2019   Pancreatic cyst 01/02/2019   Abnormal weight gain 07/31/2018   Incisional hernia, without obstruction or gangrene 04/17/2018   Other constipation 12/26/2017   History of colonic polyps 12/26/2017   Diverticulosis 12/24/2017   Elevated LDL cholesterol level 05/28/2015   Acid reflux 04/17/2015   Acquired hypothyroidism 04/17/2015   Arthritis 04/17/2015   Family history of diabetes mellitus 04/17/2015   Allergic rhinitis 04/07/2015   Anxiety 04/07/2015   Arthropathy of temporomandibular joint 04/07/2015   Spasm 04/07/2015   Climacteric 04/07/2015   Adenopathy, cervical 04/07/2015   Asthma 02/02/2015   Fibrocystic breast disease 11/26/2012    Patrina Levering PT, DPT   [facility] [address], [place], [address] Phone: [phone]   Fax:  [phone]  Name: Meredith Scott MRN: [id number] Date of Birth: Aug 05, 1945

## 2021-09-19 ENCOUNTER — Encounter: Payer: Self-pay | Admitting: Physical Therapy

## 2021-09-19 ENCOUNTER — Ambulatory Visit: Payer: Medicare HMO | Admitting: Physical Therapy

## 2021-09-19 ENCOUNTER — Other Ambulatory Visit: Payer: Self-pay

## 2021-09-19 DIAGNOSIS — G8929 Other chronic pain: Secondary | ICD-10-CM

## 2021-09-19 DIAGNOSIS — M25511 Pain in right shoulder: Secondary | ICD-10-CM | POA: Diagnosis not present

## 2021-09-19 NOTE — Therapy (Signed)
[facility] [address], [place], [address] Phone: [phone]   Fax:  [phone]  Physical Therapy Treatment  Patient Details  Name: Meredith Scott MRN: [id number] Date of Birth: [birth year] Referring Provider (PT): Brita Romp MD   Encounter Date: 09/19/2021   PT End of Session - 09/19/21 1257     Visit Number 12    Number of Visits 17    Date for PT Re-Evaluation 09/23/21    Progress Note Due on Visit 20    PT Start Time 1791    PT Stop Time 5056    PT Time Calculation (min) 50 min    Activity Tolerance Patient tolerated treatment well    Behavior During Therapy [facility] for tasks assessed/performed             Past Medical History:  Diagnosis Date   Anxiety    Asthma    Asymptomatic varicose veins 2011   Breast screening, unspecified 2013   Colon polyp    Diffuse cystic mastopathy 2013   Hernia of abdominal wall    History of hiatal hernia    Hypertension    Hypothyroidism    Dr Eddie Dibbles   Obesity, unspecified 2013   Osteoarthritis    PONV (postoperative nausea and vomiting)    Screening for obesity 2013   Skin cancer of face 2018   squamous/ left cheek   Special screening for malignant neoplasms, colon 2013    Past Surgical History:  Procedure Laterality Date   ABDOMINAL HYSTERECTOMY  1994   BREAST CYST ASPIRATION Right 1997?   breast aspiration d/t infected milk gland   BREAST MASS EXCISION Left 2011   COLONOSCOPY  2005   done by Dr. Jamal Collin   COLONOSCOPY N/A 01/27/2015   Procedure: COLONOSCOPY;  Surgeon: Christene Lye, MD;  Location: [facility] ENDOSCOPY;  Service: Endoscopy;  Laterality: N/A;   COLONOSCOPY WITH PROPOFOL N/A 02/13/2020   Procedure: COLONOSCOPY WITH PROPOFOL;  Surgeon: Robert Bellow, MD;  Location: [facility] ENDOSCOPY;  Service: Endoscopy;  Laterality: N/A;   DILATION AND CURETTAGE OF UTERUS  2007   FINE NEEDLE ASPIRATION     FOOT SURGERY Right    LIPOMA EXCISION   2010   breast bone   SKIN CANCER EXCISION Left 12/2016   under left eye, [facility]   TONGUE BIOPSY      There were no vitals filed for this visit.     **RIGHT SHOULDER, CERVICAL SPINE**     INTERVENTIONS    Manual Therapy Grade 2 progressing to grade 3 CPA and UPA, levels C2-T4, 2 x20 seconds each level.  Hypomobility noted in lower cervical/upper thoracic spine.   Trigger Point Dry Needling (TDN), unbilled. Education performed with patient regarding potential benefit of TDN. Reviewed precautions and risks with patient. Pt provided verbal consent to treatment. TDN performed with 0.30 x 60 single needle placements with local twitch response (LTR). Pistoning technique utilized. Improved pain-free motion and improved muscle recruitment following intervention. Muscles targeted: right upper trap   Seated stretches - bilateral cervical extensors, cervical rotators,  scalenes, upper traps, SCM  Seated PNF stretching to cervical rotators. Good increase in cervical ROM, to the right greater than to the left.      Ther-Ex UE ergometer, 5 minutes on level 4. 2.5 min forward, 2.5 min backwards.  Iron cross YTB 3x15 Farmer carries 8# DB in each hand, 3x66f  Shoulder shrugs 5# DB RUE, 8# DB LUE, 3x10.   -  pain/pulling in RUE with 8#     HEP: [id number]    **Aggravating activities**: reaching behind her [facility] ext) and behind the back, vacuuming, carrying things, turning her head to the R, and overhead reaching.**       Clinical Impression: Pt is pleasant and motivated throughout session. Spinal mobilizations including CPA and UPA were initiated this date as pt reports her biggest complaint at this time is limited cervical ROM. She responded well reporting a decrease in pain following manual therapy. Dry needling was utilized again this session in right upper trap for pain relief and decreased tension. TDN was followed by postural restoration exercises and strengthening of the [facility]  joint. Mare Ferrari carries performed with education emphasizing posture and setting shoulder to avoid shoulder elevation and depression. She will continue to benefit from skilled PT interventions to address above deficits and allow pt to fulfill caregiver duties and enjoy time with her grand kids/great-grand kids.          PT Short Term Goals - 09/12/21 1022       PT SHORT TERM GOAL #1   Title Pt will be independent with HEP in order to improve strength and decrease pain in order to improve pain-free function at home and work.    Baseline 07/20/21 HEP given; 2/27 continually progressing HEP with pt adherance    Time 4    Period Weeks    Status Achieved               PT Long Term Goals - 09/12/21 1022       PT LONG TERM GOAL #1   Title Pt will decrease worst pain as reported on NPRS by at least 3 points in order to demonstrate clinically significant reduction in pain.    Baseline 08/20/21:9/10, 09/12/21:8/10    Time 8    Period Weeks    Status On-going    Target Date 09/23/21      PT LONG TERM GOAL #2   Title Pt will increase gross periscapular strength to at least 4/5 MMT grade bilat in order to demonstrate improvement in strength and function    Baseline 07/20/21 R/L T scap retractors 4-/4; Y lower trap 3+/5; 09/12/21: T scap retractors 4-/5, Y low trap 4-/5    Time 8    Period Weeks    Status On-going    Target Date 09/23/21      PT LONG TERM GOAL #3   Title Pt will demonstrate full active shoulder ROM in order to complete household ADLs and self care ADLs    Baseline 07/20/21 flex 141d; abd 122d; ER C7; IR T 12; 09/12/21: F WFL, ABD 141, ER T2, IR T9    Time 8    Period Weeks    Status On-going    Target Date 09/23/21      PT LONG TERM GOAL #4   Title Patient will increase FOTO score to 64 to demonstrate predicted increase in functional mobility to complete ADLs    Baseline 07/20/21 54, 09/12/21: 59    Time 8    Period Weeks    Status On-going    Target Date 09/23/21                    Plan - 09/19/21 1255     Clinical Impression Statement Pt is pleasant and motivated throughout session. Spinal mobilizations including CPA and UPA were initiated this date as pt reports her biggest complaint at this time is limited cervical  ROM. She responded well reporting a decrease in pain following manual therapy. Dry needling was utilized again this session in right upper trap for pain relief and decreased tension. TDN was followed by postural restoration exercises and strengthening of the [facility] joint. Mare Ferrari carries performed with education emphasizing posture and setting shoulder to avoid shoulder elevation and depression. She will continue to benefit from skilled PT interventions to address above deficits and allow pt to fulfill caregiver duties and enjoy time with her grand kids/great-grand kids.    Personal Factors and Comorbidities Comorbidity 2;Fitness;Past/Current Experience;Time since onset of injury/illness/exacerbation    Comorbidities OA, HTN , hypothyroid    Examination-Activity Limitations Reach Overhead;Lift;Carry;Bed Mobility;Dressing;Hygiene/Grooming    Examination-Participation Restrictions Cleaning;Community Activity;Occupation;Meal Prep    Stability/Clinical Decision Making Evolving/Moderate complexity    Rehab Potential Good    PT Frequency 2x / week    PT Duration 8 weeks    PT Treatment/Interventions ADLs/Self Care Home Management;Cryotherapy;Electrical Stimulation;Moist Heat;Traction;Ultrasound;DME Instruction;Aquatic Therapy;Fluidtherapy;Therapeutic exercise;Functional mobility training;Neuromuscular re-education;Manual techniques;Passive range of motion;Spinal Manipulations;Joint Manipulations;Dry needling;Taping;Splinting;Patient/family education    PT Next Visit Plan increased scapulothoracic mobility and periscapular strengthening    PT Home Exercise Plan [id number]    [id number] and Agree with Plan of Care Patient             Patient will  benefit from skilled therapeutic intervention in order to improve the following deficits and impairments:  Decreased activity tolerance, Decreased endurance, Decreased range of motion, Decreased strength, Increased fascial restricitons, Impaired UE functional use, Pain, Improper body mechanics, Postural dysfunction, Impaired flexibility, Impaired tone, Decreased coordination, Decreased mobility, Decreased safety awareness, Increased muscle spasms  Visit Diagnosis: Chronic right shoulder pain     Problem List Patient Active Problem List   Diagnosis Date Noted   Chronic right shoulder pain 06/20/2021   Chronic idiopathic constipation 04/21/2021   Pelvic pressure in female 08/12/2020   Family history of systemic lupus erythematosus 06/14/2020   Family history of arthritis 06/14/2020   Osteoarthritis of knee 06/06/2019   Sprain of knee 06/06/2019   Pancreatic cyst 01/02/2019   Abnormal weight gain 07/31/2018   Incisional hernia, without obstruction or gangrene 04/17/2018   Other constipation 12/26/2017   History of colonic polyps 12/26/2017   Diverticulosis 12/24/2017   Elevated LDL cholesterol level 05/28/2015   Acid reflux 04/17/2015   Acquired hypothyroidism 04/17/2015   Arthritis 04/17/2015   Family history of diabetes mellitus 04/17/2015   Allergic rhinitis 04/07/2015   Anxiety 04/07/2015   Arthropathy of temporomandibular joint 04/07/2015   Spasm 04/07/2015   Climacteric 04/07/2015   Adenopathy, cervical 04/07/2015   Asthma 02/02/2015   Fibrocystic breast disease 11/26/2012    Patrina Levering PT, DPT   [facility] [place] [address] [place], [address] Phone: [phone]   Fax:  [phone]  Name: Meredith Scott MRN: [id number] Date of Birth: 1945/11/30

## 2021-09-20 DIAGNOSIS — Z0100 Encounter for examination of eyes and vision without abnormal findings: Secondary | ICD-10-CM | POA: Diagnosis not present

## 2021-09-20 DIAGNOSIS — H5210 Myopia, unspecified eye: Secondary | ICD-10-CM | POA: Diagnosis not present

## 2021-09-21 ENCOUNTER — Other Ambulatory Visit: Payer: Self-pay

## 2021-09-21 ENCOUNTER — Ambulatory Visit: Payer: Medicare HMO | Admitting: Physical Therapy

## 2021-09-21 DIAGNOSIS — G8929 Other chronic pain: Secondary | ICD-10-CM

## 2021-09-21 DIAGNOSIS — M25511 Pain in right shoulder: Secondary | ICD-10-CM | POA: Diagnosis not present

## 2021-09-21 NOTE — Therapy (Signed)
[facility] [address], [place], [address] Phone: [phone]   Fax:  [phone]  Physical Therapy Treatment  Patient Details  Name: Meredith Scott MRN: [id number] Date of Birth: 02-14-1946 Referring Provider (PT): Brita Romp MD   Encounter Date: 09/21/2021   PT End of Session - 09/22/21 1226     Visit Number 13    Number of Visits 17    Date for PT Re-Evaluation 09/23/21    Progress Note Due on Visit 40    PT Start Time 0833    PT Stop Time 0917    PT Time Calculation (min) 44 min    Activity Tolerance Patient tolerated treatment well    Behavior During Therapy [facility] for tasks assessed/performed             Past Medical History:  Diagnosis Date   Anxiety    Asthma    Asymptomatic varicose veins 2011   Breast screening, unspecified 2013   Colon polyp    Diffuse cystic mastopathy 2013   Hernia of abdominal wall    History of hiatal hernia    Hypertension    Hypothyroidism    Dr Eddie Dibbles   Obesity, unspecified 2013   Osteoarthritis    PONV (postoperative nausea and vomiting)    Screening for obesity 2013   Skin cancer of face 2018   squamous/ left cheek   Special screening for malignant neoplasms, colon 2013    Past Surgical History:  Procedure Laterality Date   ABDOMINAL HYSTERECTOMY  1994   BREAST CYST ASPIRATION Right 1997?   breast aspiration d/t infected milk gland   BREAST MASS EXCISION Left 2011   COLONOSCOPY  2005   done by Dr. Jamal Collin   COLONOSCOPY N/A 01/27/2015   Procedure: COLONOSCOPY;  Surgeon: Christene Lye, MD;  Location: [facility] ENDOSCOPY;  Service: Endoscopy;  Laterality: N/A;   COLONOSCOPY WITH PROPOFOL N/A 02/13/2020   Procedure: COLONOSCOPY WITH PROPOFOL;  Surgeon: Robert Bellow, MD;  Location: [facility] ENDOSCOPY;  Service: Endoscopy;  Laterality: N/A;   DILATION AND CURETTAGE OF UTERUS  2007   FINE NEEDLE ASPIRATION     FOOT SURGERY Right    LIPOMA EXCISION   2010   breast bone   SKIN CANCER EXCISION Left 12/2016   under left eye, [facility]   TONGUE BIOPSY      There were no vitals filed for this visit.   Subjective Assessment - 09/21/21 0838     Subjective Pt states she is doing well today. She has noticed a difference in her R shoulder mobility/tightness and ability to "loosen up" her neck with rotation after a couple reps of turning her head. Denies current pain.    Pertinent History Pt is a 76 year old female reporting with chronic R shoulder pain. She has had chronic R shoulder pain for several years, but reports pain was exacerbated last year with insideous onset. Reports pain is more posterior lateral, and bothers the R side of her neck as well. Reports pain is a stiff ache and comes on with aggravating activities such as reaching behind her and behind the back, vacuuming, carrying things, turning her head to the R, and overhead reaching. Lowest pain 2/10 ("it's always there"); worst 9/10; current 5/10. 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 She lives at home with her husband who helps with heavy household chores, though she is able to bath, dress, feed modI with some modifications needed for behind the back reaching. Patient is R handed. Denies any n/t. Pt denies N/V, B&B changes, unexplained weight fluctuation, saddle paresthesia, fever, night sweats, or unrelenting night pain at this time.    Limitations Lifting;House hold activities;Sitting    How long can you sit comfortably? unlimited    How long can you stand comfortably? unlimited    How long can you walk comfortably? unlimited    Diagnostic tests None since 2016               **RIGHT SHOULDER, CERVICAL SPINE**      INTERVENTIONS    Manual Therapy Grade 2 progressing to grade 3  CPA and UPA, levels C2-T4, 2 x20 seconds each level.  Hypomobility noted in lower cervical/upper thoracic spine.    STM using effleurage, ptrissage and trigger point release to B upper traps.    Seated stretches - bilateral cervical extensors, cervical rotators,  scalenes, upper traps, SCM     Ther-Ex UE ergometer, 5 minutes on level 4. 2.5 min forward, 2.5 min backwards.  Seated cervical rotation with 3-5 second hold in each direction, x10 reps. Cat-cow x10 reps Sidelying open book 2x10 reps bilaterally      HEP: [id number]   Added to HEP, provided handout: Cat Cow - 1 x daily - 3 x weekly - 2 sets - 10 reps Sidelying Thoracic Rotation with Open Book - 1 x daily - 3 x weekly - 2 sets - 10 reps    **Aggravating activities**: reaching behind her [facility] ext) and behind the back, vacuuming, carrying things, turning her head to the R, and overhead reaching.**       Clinical Impression: Pt is pleasant and motivated throughout session. Continued spinal mobs due to good response by patient including decreased pain and increased ROM following last session. Progressed POC and HEP with additional thoracic mobilization and stretching exercises. Next session will continue to address cervical rotation as well as additional aggravating activities listed above. She will continue to benefit from skilled PT interventions to address above deficits and allow pt to fulfill caregiver duties and enjoy time with her grand kids/great-grand kids.          PT Short Term Goals - 09/12/21 1022       PT SHORT TERM GOAL #1   Title Pt will be independent with HEP in order to improve strength and decrease pain in order to improve pain-free function at home and work.    Baseline 07/20/21 HEP given; 2/27 continually progressing HEP with pt adherance    Time 4    Period Weeks    Status Achieved               PT Long Term Goals - 09/12/21 1022       PT LONG TERM GOAL #1   Title Pt will decrease worst  pain as reported on NPRS by at least 3 points in order to demonstrate clinically significant reduction in pain.    Baseline 08/20/21:9/10, 09/12/21:8/10    Time 8    Period Weeks    Status On-going    Target Date 09/23/21      PT LONG TERM GOAL #2   Title Pt will increase gross periscapular strength to at least 4/5 MMT grade bilat in order to demonstrate  improvement in strength and function    Baseline 07/20/21 R/L T scap retractors 4-/4; Y lower trap 3+/5; 09/12/21: T scap retractors 4-/5, Y low trap 4-/5    Time 8    Period Weeks    Status On-going    Target Date 09/23/21      PT LONG TERM GOAL #3   Title Pt will demonstrate full active shoulder ROM in order to complete household ADLs and self care ADLs    Baseline 07/20/21 flex 141d; abd 122d; ER C7; IR T 12; 09/12/21: F WFL, ABD 141, ER T2, IR T9    Time 8    Period Weeks    Status On-going    Target Date 09/23/21      PT LONG TERM GOAL #4   Title Patient will increase FOTO score to 64 to demonstrate predicted increase in functional mobility to complete ADLs    Baseline 07/20/21 54, 09/12/21: 59    Time 8    Period Weeks    Status On-going    Target Date 09/23/21                   Plan - 09/22/21 1236     Clinical Impression Statement Pt is pleasant and motivated throughout session. Continued spinal mobs due to good response by patient including decreased pain and increased ROM following last session. Progressed POC and HEP with additional thoracic mobilization and stretching exercises. Next session will continue to address cervical rotation as well as additional aggravating activities listed above. She will continue to benefit from skilled PT interventions to address above deficits and allow pt to fulfill caregiver duties and enjoy time with her grand kids/great-grand kids.    Personal Factors and Comorbidities Comorbidity 2;Fitness;Past/Current Experience;Time since onset of injury/illness/exacerbation    Comorbidities OA, HTN  , hypothyroid    Examination-Activity Limitations Reach Overhead;Lift;Carry;Bed Mobility;Dressing;Hygiene/Grooming    Examination-Participation Restrictions Cleaning;Community Activity;Occupation;Meal Prep    Stability/Clinical Decision Making Evolving/Moderate complexity    Rehab Potential Good    PT Frequency 2x / week    PT Duration 8 weeks    PT Treatment/Interventions ADLs/Self Care Home Management;Cryotherapy;Electrical Stimulation;Moist Heat;Traction;Ultrasound;DME Instruction;Aquatic Therapy;Fluidtherapy;Therapeutic exercise;Functional mobility training;Neuromuscular re-education;Manual techniques;Passive range of motion;Spinal Manipulations;Joint Manipulations;Dry needling;Taping;Splinting;Patient/family education    PT Next Visit Plan increased scapulothoracic mobility and periscapular strengthening    PT Home Exercise Plan [email or web address]    [id number] and Agree with Plan of Care Patient             Patient will benefit from skilled therapeutic intervention in order to improve the following deficits and impairments:  Decreased activity tolerance, Decreased endurance, Decreased range of motion, Decreased strength, Increased fascial restricitons, Impaired UE functional use, Pain, Improper body mechanics, Postural dysfunction, Impaired flexibility, Impaired tone, Decreased coordination, Decreased mobility, Decreased safety awareness, Increased muscle spasms  Visit Diagnosis: Chronic right shoulder pain     Problem List Patient Active Problem List   Diagnosis Date Noted   Chronic right shoulder pain 06/20/2021   Chronic idiopathic constipation 04/21/2021   Pelvic pressure in female 08/12/2020   Family history of systemic lupus erythematosus 06/14/2020   Family history of arthritis 06/14/2020   Osteoarthritis of knee 06/06/2019   Sprain of knee 06/06/2019   Pancreatic cyst 01/02/2019   Abnormal weight gain 07/31/2018   Incisional hernia, without obstruction or gangrene 04/17/2018    Other constipation 12/26/2017   History of colonic polyps 12/26/2017   Diverticulosis 12/24/2017   Elevated LDL cholesterol level 05/28/2015   Acid  reflux 04/17/2015   Acquired hypothyroidism 04/17/2015   Arthritis 04/17/2015   Family history of diabetes mellitus 04/17/2015   Allergic rhinitis 04/07/2015   Anxiety 04/07/2015   Arthropathy of temporomandibular joint 04/07/2015   Spasm 04/07/2015   Climacteric 04/07/2015   Adenopathy, cervical 04/07/2015   Asthma 02/02/2015   Fibrocystic breast disease 11/26/2012    Patrina Levering PT, DPT   [facility] [address] [place], [address] Phone: [phone]   Fax:  [phone]  Name: Meredith Scott MRN: [id number] Date of Birth: Oct 04, 1945

## 2021-09-22 ENCOUNTER — Encounter: Payer: Self-pay | Admitting: Physical Therapy

## 2021-09-26 ENCOUNTER — Encounter: Payer: Medicare HMO | Admitting: Physical Therapy

## 2021-09-28 ENCOUNTER — Ambulatory Visit: Payer: Medicare HMO | Admitting: Physical Therapy

## 2021-09-28 ENCOUNTER — Encounter: Payer: Medicare HMO | Admitting: Physical Therapy

## 2021-09-30 ENCOUNTER — Other Ambulatory Visit: Payer: Self-pay

## 2021-09-30 ENCOUNTER — Ambulatory Visit: Payer: Medicare HMO

## 2021-09-30 DIAGNOSIS — G8929 Other chronic pain: Secondary | ICD-10-CM | POA: Diagnosis not present

## 2021-09-30 DIAGNOSIS — M25511 Pain in right shoulder: Secondary | ICD-10-CM | POA: Diagnosis not present

## 2021-09-30 NOTE — Therapy (Signed)
[facility] ?[address] ?[place], [place], [address] ?Phone: [phone]   Fax:  [phone] ? ?Physical Therapy Treatment/ Recertification ?Reporting period: 08/22/21 - 09/30/21 ? ?Patient Details  ?Name: Meredith Scott ?MRN: [id number] ?Date of Birth: 02-Sep-1945 ?Referring Provider (PT): Bacigalupo MD ? ? ?Encounter Date: 09/30/2021 ? ? PT End of Session - 09/30/21 1779   ? ? Visit Number 14   ? Number of Visits 17   ? Date for PT Re-Evaluation 09/23/21   ? Progress Note Due on Visit 20   ? PT Start Time [phone]   ? PT Stop Time 1001   ? PT Time Calculation (min) 43 min   ? Activity Tolerance Patient tolerated treatment well   ? Behavior During Therapy [facility] for tasks assessed/performed   ? ?  ?  ? ?  ? ? ?Past Medical History:  ?Diagnosis Date  ? Anxiety   ? Asthma   ? Asymptomatic varicose veins 2011  ? Breast screening, unspecified 2013  ? Colon polyp   ? Diffuse cystic mastopathy 2013  ? Hernia of abdominal wall   ? History of hiatal hernia   ? Hypertension   ? Hypothyroidism   ? Dr Eddie Dibbles  ? Obesity, unspecified 2013  ? Osteoarthritis   ? PONV (postoperative nausea and vomiting)   ? Screening for obesity 2013  ? Skin cancer of face 2018  ? squamous/ left cheek  ? Special screening for malignant neoplasms, colon 2013  ? ? ?Past Surgical History:  ?Procedure Laterality Date  ? ABDOMINAL HYSTERECTOMY  1994  ? BREAST CYST ASPIRATION Right 1997?  ? breast aspiration d/t infected milk gland  ? BREAST MASS EXCISION Left 2011  ? COLONOSCOPY  2005  ? done by Dr. Jamal Collin  ? COLONOSCOPY N/A 01/27/2015  ? Procedure: COLONOSCOPY;  Surgeon: Christene Lye, MD;  Location[facility] END[facility]COPY;  Service: Endoscopy;  Laterality: N/A;  ? COLONOSCOPY WITH PROPOFOL N/A 02/13/2020  ? Procedure: COLONOSCOPY WITH PROPOFOL;  Surgeon: Robert Bellow, MD;  Location: [facility] ENDOSCOPY;  Service: Endoscopy;  Laterality: N/A;  ? [place] OF UTERUS  2007  ? FINE NEEDLE  ASPIRATION    ? FOOT SURGERY Right   ? LIPOMA EXCISION  2010  ? breast bone  ? SKIN CANCER EXCISION Left 12/2016  ? under left eye, [facility]  ? TONGUE BIOPSY    ? ? ?There were no vitals filed for this visit. ? ? Subjective Assessment - 09/30/21 0919   ? ? Subjective Pt reports R shoulder and neck coming along with PT. Worst pain since previous session was a 5/10 NPS.   ? Pertinent History Pt is a 76 year old female reporting with chronic R shoulder pain. She has had chronic R shoulder pain for several years, but reports pain was exacerbated last year with insideous onset. Reports pain is more posterior lateral, and bothers the R side of her neck as well. Reports pain is a stiff ache and comes on with aggravating activities such as reaching behind her and behind the back, vacuuming, carrying things, turning her head to the R, and overhead reaching. Lowest pain 2/10 ("it's always there"); worst 9/10; current 5/10. 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 She lives at  home with her husband who helps with heavy household chores, though she is able to bath, dress, feed modI with some modifications needed for behind the back reaching. Patient is R handed. Denies any n/t. Pt denies N/V, B&B changes, unexplained weight fluctuation, saddle paresthesia, fever, night sweats, or unrelenting night pain at this time.   ? Limitations Lifting;House hold activities;Sitting   ? How long can you sit comfortably? unlimited   ? How long can you stand comfortably? unlimited   ? How long can you walk comfortably? unlimited   ? Diagnostic tests None since 2016   ? Currently in Pain? Yes   ? Pain Score 3    ? Pain Location Shoulder   ? Pain Orientation Right;Posterior;Lateral   ? Pain Descriptors / Indicators Aching   ? Pain Type Chronic pain   ? ?   ?  ? ?  ? ? ? ?There.ex:  ?Majority of session with focus on reassessment of goals. Pt accomplishing current pain goal and making progress in periscapular strength, AROM and FOTO goal is ISQ.  ? ?Cat Cow - 1 x daily - 3 x weekly - 2 sets - 10 reps ? ?Sidelying Thoracic Rotation with Open Book - 1 x daily - 3 x weekly - 2 sets - 10 reps ? ?Standing 1# DB passes around back for shoulder add, extension, and IR mobility: x10 CW and x10 CCW.  ? ?Seated thoracic extension with pillow supporting lumbar spine and UE's across chest: x10. Min multimodal cues for form/technique.  ? ? ? ? PT Education - 09/30/21 1016   ? ? Education Details Progress towards goals, thoracic mobility   ? Person(s) Educated Patient   ? Methods Explanation;Demonstration;Tactile cues;Verbal cues   ? Comprehension Verbalized understanding;Returned demonstration   ? ?  ?  ? ?  ? ? ? PT Short Term Goals - 09/12/21 1022   ? ?  ? PT SHORT TERM GOAL #1  ? Title Pt will be independent with HEP in order to improve strength and decrease pain in order to improve pain-free function at home and work.   ? Baseline 07/20/21 HEP given; 2/27 continually progressing HEP with pt adherance   ? Time 4   ? Period Weeks   ? Status Achieved   ? ?  ?  ? ?  ? ? ? ? PT Long Term Goals - 09/30/21 0922   ? ?  ? PT LONG TERM GOAL #1  ? Title Pt will decrease worst pain as reported on NPRS by at least 3 points in order to demonstrate clinically significant reduction in pain.   ? Baseline 08/20/21: 9/10, 09/12/21: 8/10; 09/30/21: 5/10 NPS   ? Time 8   ? Period Weeks   ? Status Achieved   ? Target Date 09/30/21   ?  ? PT LONG TERM GOAL #2  ? Title Pt will increase gross periscapular strength to at least 4/5 MMT grade bilat in order to demonstrate improvement in strength and function   ? Baseline 07/20/21 R/L T scap retractors 4-/4; Y lower trap 3+/5; 09/12/21: T scap retractors 4-/5, Y low trap 4-/5; 09/30/21: T scap retraction: 4/4-  Y low trap: 4/4   ? Time 8   ? Period Weeks   ? Status  On-going   ? Target Date 11/25/21   ?  ? PT LONG TERM GOAL #3  ? Title Pt will demonstrate full active shoulder ROM in order to complete household ADLs and self care ADLs   ?  Baseline 07/20/21 flex 141d; abd 122d; ER C7; IR T 12; 09/12/21: F WFL, ABD 141, ER T2, IR T9; 09/30/21: flexion:143 deg, abduction: 168 deg,  ER: C2/occiput, IR: T7 on RUE, T4 on LUE   ? Time 8   ? Period Weeks   ? Status On-going   ? Target Date 11/25/21   ?  ? PT LONG TERM GOAL #4  ? Title Patient will increase FOTO score to 64 to demonstrate predicted increase in functional mobility to complete ADLs   ? Baseline 07/20/21 54, 09/12/21: 59; 09/30/21: 59 with target of 64   ? Time 8   ? Period Weeks   ? Status On-going   ? Target Date 11/25/21   ?  ? PT LONG TERM GOAL #5  ? Title Pt will demonstrate </= 2/10 worst pain with cervical mobility and overhead ADL's to demonstrate further clinically significnat improvement with household tasks.   ? Baseline 09/30/21: 5/10 NPS   ? Time 8   ? Period Weeks   ? Status New   ? Target Date 11/25/21   ? ?  ?  ? ?  ? ? ? ? ? ? ? ? Plan - 09/30/21 1017   ? ? Clinical Impression Statement Reassessment performed today due to being at end of current POC. Pt met 1 long term goal with regards to worst pain levels. Pt making progress towards other goals with R shoulder AROM and perisacpular strengthening however FOTO score remains unchanged. Pt remains with limited cervical, shoulder and upper thoracic AROM deficits leading to pain and dysfunction with overhead ADL task,   ? Personal Factors and Comorbidities Comorbidity 2;Fitness;Past/Current Experience;Time since onset of injury/illness/exacerbation   ? Comorbidities OA, HTN , hypothyroid   ? Examination-Activity Limitations Reach Overhead;Lift;Carry;Bed Mobility;Dressing;Hygiene/Grooming   ? Examination-Participation Restrictions Cleaning;Community Activity;Occupation;Meal Prep   ? Stability/Clinical Decision Making Evolving/Moderate complexity   ? Rehab Potential Good    ? PT Frequency 2x / week   ? PT Duration 8 weeks   ? PT Treatment/Interventions ADLs/Self Care Home Management;Cryotherapy;Electrical Stimulation;Moist Heat;Traction;Ultrasound;DME Instruction;[place]

## 2021-10-03 ENCOUNTER — Ambulatory Visit: Payer: Medicare HMO

## 2021-10-03 ENCOUNTER — Telehealth: Payer: Self-pay | Admitting: Family Medicine

## 2021-10-03 ENCOUNTER — Other Ambulatory Visit: Payer: Self-pay

## 2021-10-03 DIAGNOSIS — G8929 Other chronic pain: Secondary | ICD-10-CM | POA: Diagnosis not present

## 2021-10-03 DIAGNOSIS — N951 Menopausal and female climacteric states: Secondary | ICD-10-CM

## 2021-10-03 DIAGNOSIS — M25511 Pain in right shoulder: Secondary | ICD-10-CM | POA: Diagnosis not present

## 2021-10-03 NOTE — Therapy (Signed)
[facility] ?[address] ?[place], [place], [address] ?Phone: [phone]   Fax:  [phone] ? ?Physical Therapy Treatment ? ?Patient Details  ?Name: Meredith Scott ?MRN: [id number] ?Date of Birth: Sep 23, 1945 ?Referring Provider (PT): Bacigalupo MD ? ? ?Encounter Date: 10/03/2021 ? ? PT End of Session - 10/03/21 1351   ? ? Visit Number 15   ? Number of Visits 17   ? Date for PT Re-Evaluation 11/25/21   ? Authorization Type Aetna Medicare- [place] based on [place]   ? Authorization Time Period 09/30/21-11/25/21   ? Progress Note Due on Visit 20   ? PT Start Time 1002   ? PT Stop Time 1941   ? PT Time Calculation (min) 40 min   ? Activity Tolerance Patient tolerated treatment well;No increased pain   ? Behavior During Therapy [facility] for tasks assessed/performed   ? ?  ?  ? ?  ? ? ?Past Medical History:  ?Diagnosis Date  ? Anxiety   ? Asthma   ? Asymptomatic varicose veins 2011  ? Breast screening, unspecified 2013  ? Colon polyp   ? Diffuse cystic mastopathy 2013  ? Hernia of abdominal wall   ? History of hiatal hernia   ? Hypertension   ? Hypothyroidism   ? Dr Eddie Dibbles  ? Obesity, unspecified 2013  ? Osteoarthritis   ? PONV (postoperative nausea and vomiting)   ? Screening for obesity 2013  ? Skin cancer of face 2018  ? squamous/ left cheek  ? Special screening for malignant neoplasms, colon 2013  ? ? ?Past Surgical History:  ?Procedure Laterality Date  ? ABDOMINAL HYSTERECTOMY  1994  ? BREAST CYST ASPIRATION Right 1997?  ? breast aspiration d/t infected milk gland  ? BREAST MASS EXCISION Left 2011  ? COLONOSCOPY  2005  ? done by Dr. Jamal Collin  ? COLONOSCOPY N/A 01/27/2015  ? Procedure: COLONOSCOPY;  Surgeon: Christene Lye, MD;  Location: [facility] ENDOSCOPY;  Service: Endoscopy;  Laterality: N/A;  ? COLONOSCOPY WITH PROPOFOL N/A 02/13/2020  ? Procedure: COLONOSCOPY WITH PROPOFOL;  Surgeon: Robert Bellow, MD;  Location: [facility] ENDOSCOPY;  Service: Endoscopy;   Laterality: N/A;  ? [employer] OF UTERUS  2007  ? FINE NEEDLE ASPIRATION    ? FOOT SURGERY Right   ? LIPOMA EXCISION  2010  ? breast bone  ? SKIN CANCER EXCISION Left 12/2016  ? under left eye, [facility]  ? TONGUE BIOPSY    ? ? ?There were no vitals filed for this visit. ? ? Subjective Assessment - 10/03/21 1350   ? ? Subjective Pt arrives with  HEP papers, asks for help organizing her home program given recent advancements and updates.   ? Pertinent History Lollie Spangle is a 76 year old female reporting with chronic R shoulder pain. She has had chronic R shoulder pain for several years, but reports pain was exacerbated last year with insideous onset. Reports pain is more posterior lateral, and bothers the R side of her neck as well. Reports pain is a stiff ache and comes on with aggravating activities such as reaching behind her and behind the back, vacuuming, carrying things, turning her head to the R, and overhead reaching. Lowest pain 2/10 ("it's always there"); worst 9/10; current 5/10. 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 She lives at home with her husband who helps with heavy household chores, though she is able to bath, dress, feed modI with some modifications needed for behind the back reaching. Patient is R handed. Denies any n/t. Pt denies N/V, B&B changes, unexplained weight fluctuation, saddle paresthesia, fever, night sweats, or unrelenting night pain at this time.   ? Currently in Pain? No/denies   stiffness only  ? ?  ?  ? ?  ? ? ?INTERVENTION: ? ?-UBE warmup, 2.5 fwd, 2.5 backward level 4 ?-Standing shoulder flexion 1x10 bilat @ 3lb FW (performed correctly as prescribed, asked to continue) ?-Standing shoulder abduction 1x10 bilat @ 3lb FW (performed correctly as prescribed, asked to  continue) ?-standing scapular elevation 1x10 bilat c 7lb FW bilat (performed correctly as prescribed, asked to continue and progress from 3lb FW to 1 gallon jugs) ?-Standing GreenTB row 1x10 (performed correctly as prescribed, asked to continue c progression to blueTB) ?-Standing GreenTB straight arm extension from 90 degrees (a carryover from prior PT episode, allowed to continue ad lib, advised against progression in band color at this time)  ?-standing straight arm horizontal abduction c greenTB (performed correctly as prescribed, asked to discontinue due to redundancy) ?Standing bilat yellow GHJ ER (not performed correctly, moving as previous exercise but supinated, disconitnued and replaced with next exercise) ?-seated c arm ABDCT, elbow supported on table GHJ ER 1x10 c 3lb bilat (new addition to HEP)  ?-visual/verbal review of cat/cow, book openings, and extension over chair: will allow to conitnue ad lib ? ?*cervical rotation/SB deficits not addressed in HEP, will review next session.  ? ? ? PT Education - 10/03/21 1354   ? ? Education Details reviewed HEP with patient   ? Person(s) Educated Patient   ? Methods Explanation   ? Comprehension Verbalized understanding   ? ?  ?  ? ?  ? ? ? PT Short Term Goals - 09/12/21 1022   ? ?  ? PT SHORT TERM GOAL #1  ? Title Pt will be independent with HEP in order to improve strength and decrease pain in order to improve pain-free function at home and work.   ? Baseline 07/20/21 HEP given; 2/27 continually progressing HEP with pt adherance   ? Time 4   ? Period Weeks   ? Status Achieved   ? ?  ?  ? ?  ? ? ? ? PT Long Term Goals - 09/30/21 0922   ? ?  ? PT LONG TERM GOAL #1  ? Title Pt will decrease worst pain as reported on NPRS by at least 3 points in order to demonstrate clinically significant reduction in pain.   ? Baseline 08/20/21: 9/10, 09/12/21: 8/10; 09/30/21: 5/10 NPS   ? Time 8   ? Period Weeks   ? Status Achieved   ? Target Date 09/30/21   ?  ? PT LONG TERM GOAL #2  ?  Title Pt will increase gross periscapular strength to at least 4/5 MMT grade bilat in order to demonstrate improvement in strength and function   ? Baseline 07/20/21 R/L T scap retractors 4-/4; Y lower trap 3+/5; 09/12/21: T scap retractors 4-/5, Y low trap 4-/5; 09/30/21: T scap retraction: 4/4-  Y low trap: 4/4   ? Time 8   ? Period Weeks   ? Status On-going   ? Target Date 11/25/21   ?  ? PT LONG TERM GOAL #3  ?  Title Pt will demonstrate full active shoulder ROM in order to complete household ADLs and self care ADLs   ? Baseline 07/20/21 flex 141d; abd 122d; ER C7; IR T 12; 09/12/21: F WFL, ABD 141, ER T2, IR T9; 09/30/21: flexion:143 deg, abduction: 168 deg,  ER: C2/occiput, IR: T7 on RUE, T4 on LUE   ? Time 8   ? Period Weeks   ? Status On-going   ? Target Date 11/25/21   ?  ? PT LONG TERM GOAL #4  ? Title Patient will increase FOTO score to 64 to demonstrate predicted increase in functional mobility to complete ADLs   ? Baseline 07/20/21 54, 09/12/21: 59; 09/30/21: 59 with target of 64   ? Time 8   ? Period Weeks   ? Status On-going   ? Target Date 11/25/21   ?  ? PT LONG TERM GOAL #5  ? Title Pt will demonstrate </= 2/10 worst pain with cervical mobility and overhead ADL's to demonstrate further clinically significnat improvement with household tasks.   ? Baseline 09/30/21: 5/10 NPS   ? Time 8   ? Period Weeks   ? Status New   ? Target Date 11/25/21   ? ?  ?  ? ?  ? ? ? ? ? ? ? ? Plan - 10/03/21 1354   ? ? Clinical Impression Statement Updated HEP in full. HEP currently lacking any direct intervention to address severe cervical ROM deficits which remain biggest remaining deficits at this time. Otherwise excellent demonstration of improved shoulder strength and activity tolerance.   ? Personal Factors and Comorbidities Comorbidity 2   ? Comorbidities OA, HTN , hypothyroid   ? Examination-Activity Limitations Bed Mobility   ? Examination-Participation Restrictions Community Activity   ? Stability/Clinical Decision Making  Evolving/Moderate complexity   ? Clinical Decision Making Moderate   ? Rehab Potential Good   ? PT Frequency 2x / week   ? PT Duration 8 weeks   ? PT Treatment/Interventions ADLs/Self Care Home Management;Cryotherapy;E

## 2021-10-04 NOTE — Telephone Encounter (Signed)
Pt called about refill status and stated she was told it was denied but the Rx showing as sent today but the class says PRINT / may have been an error when sending to pharmacy / please resend and/or advise  ? ?EST ESTROGENS-METHYLTEST HS 0.625-1.25 MG tablet ? ?[id number] [address], [place] [address]  ?[address] [place], [place] [address]  ?Phone:  [phone]  Fax:  [phone] ?

## 2021-10-05 ENCOUNTER — Encounter: Payer: Self-pay | Admitting: Physician Assistant

## 2021-10-05 ENCOUNTER — Ambulatory Visit: Payer: Medicare HMO

## 2021-10-05 ENCOUNTER — Other Ambulatory Visit: Payer: Self-pay | Admitting: Physician Assistant

## 2021-10-05 ENCOUNTER — Other Ambulatory Visit: Payer: Self-pay

## 2021-10-05 DIAGNOSIS — N951 Menopausal and female climacteric states: Secondary | ICD-10-CM

## 2021-10-05 DIAGNOSIS — G8929 Other chronic pain: Secondary | ICD-10-CM

## 2021-10-05 MED ORDER — EST ESTROGENS-METHYLTEST HS 0.625-1.25 MG PO TABS: ORAL_TABLET | ORAL | 0 refills | Status: DC

## 2021-10-05 MED ORDER — EST ESTROGENS-METHYLTEST HS 0.625-1.25 MG PO TABS: ORAL_TABLET | ORAL | 3 refills | Status: DC

## 2021-10-05 NOTE — Telephone Encounter (Signed)
Meredith Scott-looks like this RX was printed by you-  Can it be sent in e-scribe instead?

## 2021-10-05 NOTE — Telephone Encounter (Signed)
Patient needs to make appointment. I do not know how or why she takes this medication despite a lengthy chart review but she has taken for years so will refill so she does not go without.  ?Only diagnosis attached is menopause but she is 76 y/o.  ?Need to see pt in office to discuss use and refer to gyn for continuation of therapy. ?

## 2021-10-05 NOTE — Telephone Encounter (Signed)
Pt called in stating the pharmacy stated they still have not received the medication but they told her it might be better if PCP calls over to get it refilled. Please advise.  ?

## 2021-10-05 NOTE — Therapy (Signed)
[facility] [address], [place], [address] Phone: [phone]   Fax:  [phone]  Physical Therapy Treatment  Patient Details  Name: Meredith Scott MRN: [id number] Date of Birth: 06-28-1946 Referring Provider (PT): Beryle Flock MD   Encounter Date: 10/05/2021   PT End of Session - 10/05/21 0837     Visit Number 16    Number of Visits 24    Date for PT Re-Evaluation 11/25/21    Authorization Type Aetna Medicare- [place] based on [place]    Authorization Time Period 09/30/21-11/25/21    Progress Note Due on Visit 20    PT Start Time 0833    PT Stop Time 0913    PT Time Calculation (min) 40 min    Activity Tolerance Patient tolerated treatment well;No increased pain    Behavior During Therapy [facility] for tasks assessed/performed             Past Medical History:  Diagnosis Date   Anxiety    Asthma    Asymptomatic varicose veins 2011   Breast screening, unspecified 2013   Colon polyp    Diffuse cystic mastopathy 2013   Hernia of abdominal wall    History of hiatal hernia    Hypertension    Hypothyroidism    Dr [name]   Obesity, unspecified 2013   Osteoarthritis    PONV (postoperative nausea and vomiting)    Screening for obesity 2013   Skin cancer of face 2018   squamous/ left cheek   Special screening for malignant neoplasms, colon 2013    Past Surgical History:  Procedure Laterality Date   ABDOMINAL HYSTERECTOMY  1994   BREAST CYST ASPIRATION Right 1997?   breast aspiration d/t infected milk gland   BREAST MASS EXCISION Left 2011   COLONOSCOPY  2005   done by Dr. Evette Cristal   COLONOSCOPY N/A 01/27/2015   Procedure: COLONOSCOPY;  Surgeon: Kieth Brightly, MD;  Location: [facility] ENDOSCOPY;  Service: Endoscopy;  Laterality: N/A;   COLONOSCOPY WITH PROPOFOL N/A 02/13/2020   Procedure: COLONOSCOPY WITH PROPOFOL;  Surgeon: Earline Mayotte, MD;  Location: [facility] ENDOSCOPY;  Service: Endoscopy;   Laterality: N/A;   DILATION AND CURETTAGE OF UTERUS  2007   FINE NEEDLE ASPIRATION     FOOT SURGERY Right    LIPOMA EXCISION  2010   breast bone   SKIN CANCER EXCISION Left 12/2016   under left eye, [facility]   TONGUE BIOPSY      There were no vitals filed for this visit.   Subjective Assessment - 10/05/21 0836     Subjective Pt doing well today, reports more soreness in nekc after last sesison with new exercise updates, but resolved today.    Pain Score 4    3-4/10 Rt upper traps area.            Cervical ROM  Right  Left  Cervical Rotation (global)    Capital Rotation 58 45  Cervical Lateral Flexion  28 15  Cervical Extension 46 degrees, firm end feel  Cervical Flexion 2 fingers chin to sternum   Seated thoracic rotation    Seated thoracic extension    Seated thoracic flexion     -ART to Rt upper traps margin paired with Left lateral flexion  -Left lateral flexion 21 degrees after intervention  -ART Rt UT margin paired with Left cervical rotation  -Left rotation+flexion paired with self-overpressure and heat at Rt UT x20 -Left cervical  rotation paired c heat x15 -capital flexion stretch 3x30sec      PT Education - 10/05/21 0839     Education Details reviewed POC going forward in respect to neck    Person(s) Educated Patient    Methods Explanation    Comprehension Verbalized understanding              PT Short Term Goals - 09/12/21 1022       PT SHORT TERM GOAL #1   Title Pt will be independent with HEP in order to improve strength and decrease pain in order to improve pain-free function at home and work.    Baseline 07/20/21 HEP given; 2/27 continually progressing HEP with pt adherance    Time 4    Period Weeks    Status Achieved               PT Long Term Goals - 09/30/21 7253       PT LONG TERM GOAL #1   Title Pt will decrease worst pain as reported on NPRS by at least 3 points in order to demonstrate clinically significant  reduction in pain.    Baseline 08/20/21: 9/10, 09/12/21: 8/10; 09/30/21: 5/10 NPS    Time 8    Period Weeks    Status Achieved    Target Date 09/30/21      PT LONG TERM GOAL #2   Title Pt will increase gross periscapular strength to at least 4/5 MMT grade bilat in order to demonstrate improvement in strength and function    Baseline 07/20/21 R/L T scap retractors 4-/4; Y lower trap 3+/5; 09/12/21: T scap retractors 4-/5, Y low trap 4-/5; 09/30/21: T scap retraction: 4/4-  Y low trap: 4/4    Time 8    Period Weeks    Status On-going    Target Date 11/25/21      PT LONG TERM GOAL #3   Title Pt will demonstrate full active shoulder ROM in order to complete household ADLs and self care ADLs    Baseline 07/20/21 flex 141d; abd 122d; ER C7; IR T 12; 09/12/21: F WFL, ABD 141, ER T2, IR T9; 09/30/21: flexion:143 deg, abduction: 168 deg,  ER: C2/occiput, IR: T7 on RUE, T4 on LUE    Time 8    Period Weeks    Status On-going    Target Date 11/25/21      PT LONG TERM GOAL #4   Title Patient will increase FOTO score to 64 to demonstrate predicted increase in functional mobility to complete ADLs    Baseline 07/20/21 54, 09/12/21: 59; 09/30/21: 59 with target of 64    Time 8    Period Weeks    Status On-going    Target Date 11/25/21      PT LONG TERM GOAL #5   Title Pt will demonstrate </= 2/10 worst pain with cervical mobility and overhead ADL's to demonstrate further clinically significnat improvement with household tasks.    Baseline 09/30/21: 5/10 NPS    Time 8    Period Weeks    Status New    Target Date 11/25/21                   Plan - 10/05/21 0840     Clinical Impression Statement HEP finalized going well for shoulder strength. Reassessment of cervical ROM revealing of continued limitations in Left lateral flexion, left rotation, and suboccipital tightness restricting capital flexion. Pt responds well to manual therapy interventions with >5  degrees gained within session. Will advance  interventions to independent home versions next few sessions.    Personal Factors and Comorbidities Comorbidity 2    Comorbidities OA, HTN , hypothyroid    Examination-Activity Limitations Bathing    Examination-Participation Restrictions Community Activity    Stability/Clinical Decision Making Evolving/Moderate complexity    Clinical Decision Making Moderate    Rehab Potential Good    PT Frequency 2x / week    PT Duration 8 weeks    PT Treatment/Interventions ADLs/Self Care Home Management;Cryotherapy;Electrical Stimulation;Moist Heat;Traction;Ultrasound;DME Instruction;Aquatic Therapy;Fluidtherapy;Therapeutic exercise;Functional mobility training;Neuromuscular re-education;Manual techniques;Passive range of motion;Spinal Manipulations;Joint Manipulations;Dry needling;Taping;Splinting;Patient/family education    PT Next Visit Plan get updates on new    PT Home Exercise Plan [email or web address]    Consulted and Agree with Plan of Care Patient             Patient will benefit from skilled therapeutic intervention in order to improve the following deficits and impairments:  Decreased activity tolerance, Decreased endurance, Decreased range of motion, Decreased strength, Increased fascial restricitons, Impaired UE functional use, Pain, Improper body mechanics, Postural dysfunction, Impaired flexibility, Impaired tone, Decreased coordination, Decreased mobility, Decreased safety awareness, Increased muscle spasms  Visit Diagnosis: Chronic right shoulder pain     Problem List Patient Active Problem List   Diagnosis Date Noted   Chronic right shoulder pain 06/20/2021   Chronic idiopathic constipation 04/21/2021   Pelvic pressure in female 08/12/2020   Family history of systemic lupus erythematosus 06/14/2020   Family history of arthritis 06/14/2020   Osteoarthritis of knee 06/06/2019   Sprain of knee 06/06/2019   Pancreatic cyst 01/02/2019   Abnormal weight gain 07/31/2018   Incisional  hernia, without obstruction or gangrene 04/17/2018   Other constipation 12/26/2017   History of colonic polyps 12/26/2017   Diverticulosis 12/24/2017   Elevated LDL cholesterol level 05/28/2015   Acid reflux 04/17/2015   Acquired hypothyroidism 04/17/2015   Arthritis 04/17/2015   Family history of diabetes mellitus 04/17/2015   Allergic rhinitis 04/07/2015   Anxiety 04/07/2015   Arthropathy of temporomandibular joint 04/07/2015   Spasm 04/07/2015   Climacteric 04/07/2015   Adenopathy, cervical 04/07/2015   Asthma 02/02/2015   Fibrocystic breast disease 11/26/2012   9:08 AM, 10/05/21 Rosamaria Lints, PT, DPT Physical Therapist - [place] [phone] (Office)   [place] C, PT 10/05/2021, 8:43 AM  [facility] [address] [place], [address] Phone: [phone]   Fax:  [phone]  Name: Meredith Scott MRN: [id number] Date of Birth: Oct 27, 1945

## 2021-10-06 ENCOUNTER — Other Ambulatory Visit: Payer: Self-pay | Admitting: Physician Assistant

## 2021-10-06 DIAGNOSIS — N951 Menopausal and female climacteric states: Secondary | ICD-10-CM

## 2021-10-06 MED ORDER — EST ESTROGENS-METHYLTEST HS 0.625-1.25 MG PO TABS: ORAL_TABLET | ORAL | 0 refills | Status: DC

## 2021-10-10 ENCOUNTER — Ambulatory Visit: Payer: Medicare HMO

## 2021-10-10 ENCOUNTER — Other Ambulatory Visit: Payer: Self-pay

## 2021-10-10 DIAGNOSIS — G8929 Other chronic pain: Secondary | ICD-10-CM | POA: Diagnosis not present

## 2021-10-10 DIAGNOSIS — M25511 Pain in right shoulder: Secondary | ICD-10-CM | POA: Diagnosis not present

## 2021-10-10 NOTE — Therapy (Signed)
[place] [facility] ?[address] ?[place], [place], [address] ?Phone: [phone]   Fax:  [phone] ? ?Physical Therapy Treatment ? ?Patient Details  ?Name: Meredith Scott ?MRN: [id number] ?Date of Birth: Mar 10, 1946 ?Referring Provider (PT): Bacigalupo MD ? ? ?Encounter Date: 10/10/2021 ? ? PT End of Session - 10/10/21 1129   ? ? Visit Number 17   ? Number of Visits 24   ? Date for PT Re-Evaluation 11/25/21   ? Authorization Type Aetna Medicare- [place] based on [place]   ? Authorization Time Period 09/30/21-11/25/21   ? Progress Note Due on Visit 20   ? PT Start Time 1120   ? PT Stop Time 1200   ? PT Time Calculation (min) 40 min   ? Equipment Utilized During Treatment Gait belt   ? Activity Tolerance Patient tolerated treatment well;No increased pain   ? Behavior During Therapy [facility] for tasks assessed/performed   ? ?  ?  ? ?  ? ? ?Past Medical History:  ?Diagnosis Date  ? Anxiety   ? Asthma   ? Asymptomatic varicose veins 2011  ? Breast screening, unspecified 2013  ? Colon polyp   ? Diffuse cystic mastopathy 2013  ? Hernia of abdominal wall   ? History of hiatal hernia   ? Hypertension   ? Hypothyroidism   ? Dr Eddie Dibbles  ? Obesity, unspecified 2013  ? Osteoarthritis   ? PONV (postoperative nausea and vomiting)   ? Screening for obesity 2013  ? Skin cancer of face 2018  ? squamous/ left cheek  ? Special screening for malignant neoplasms, colon 2013  ? ? ?Past Surgical History:  ?Procedure Laterality Date  ? ABDOMINAL HYSTERECTOMY  1994  ? BREAST CYST ASPIRATION Right 1997?  ? breast aspiration d/t infected milk gland  ? BREAST MASS EXCISION Left 2011  ? COLONOSCOPY  2005  ? done by Dr. Jamal Collin  ? COLONOSCOPY N/A 01/27/2015  ? Procedure: COLONOSCOPY;  Surgeon: Christene Lye, MD;  Location[facility] ENDOSCOPY;  Service: Endoscopy;  Laterality: N/A;  ? COLONOSCOPY WITH PROPOFOL N/A 02/13/2020  ? Procedure: COLONOSCOPY WITH PROPOFOL;  Surgeon: Robert Bellow, MD;   Location: [facility] ENDOSCOPY;  Service: Endoscopy;  Laterality: N/A;  ? [place] OF UTERUS  2007  ? FINE NEEDLE ASPIRATION    ? FOOT SURGERY Right   ? LIPOMA EXCISION  2010  ? breast bone  ? SKIN CANCER EXCISION Left 12/2016  ? under left eye, [facility]  ? TONGUE BIOPSY    ? ? ?There were no vitals filed for this visit. ? ? Subjective Assessment - 10/10/21 1125   ? ? Subjective Pt reports she was a little sore after last session's stretcing, but it improved by end of day. Pt also reports some improvments in neck stiffness for the same day.   ? Pertinent History Meredith Scott is a 76 year old female reporting with chronic R shoulder pain. She has had chronic R shoulder pain for several years, but reports pain was exacerbated last year with insideous onset. Reports pain is more posterior lateral, and bothers the R side of her neck as well. Reports pain is a stiff ache and comes on with aggravating activities such as reaching behind her and behind the back, vacuuming, carrying things, turning her head to the R, and overhead reaching. Lowest pain 2/10 ("it's always there"); worst 9/10; current 5/10. 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 She lives at home with her husband who helps with heavy household chores, though she is able to bath, dress, feed modI with some modifications needed for behind the back reaching. Patient is R handed. Denies any n/t. Pt denies N/V, B&B changes, unexplained weight fluctuation, saddle paresthesia, fever, night sweats, or unrelenting night pain at this time.   ? Pain Score 3    2-3 Rt posterior brachial area  ? ?  ?  ? ?  ? ?Cervical ROM 10/05/21 Right  Left  ?Cervical Rotation (global)      ?Capital Rotation 58 45  ?Cervical Lateral Flexion  28 15  ?Cervical Extension 46 degrees, firm  end feel  ?Cervical Flexion 2 fingers chin to sternum   ?Seated thoracic rotation      ?Seated thoracic extension      ?Seated thoracic flexion      ?  ? ?INTERVENTION THIS DATE: ?Cervical retraction into 2 pillows 15x3secH  ?Cervical retraction into 2 pillows (starting in 40 degrees flexion) 15x3secH  ?Left cervical rotation stretch with self over pressure in supine 3x30sec   ?Left Lateral flexion P/ROM stretch 2x30secH  ?ART to Rt upper traps margin paired with Left lateral flexion, Left rotation x5 minutes  ?Moist heat to Right UT ?Bilat pec minor stretch posterior tilt 2x30sec  ? ? ? ? ? ? PT Education - 10/10/21 1130   ? ? Education Details reviewed exercises for nekc strentch and mobility   ? Person(s) Educated Patient   ? Methods Explanation;Demonstration   ? Comprehension Verbalized understanding;Returned demonstration   ? ?  ?  ? ?  ? ? ? PT Short Term Goals - 09/12/21 1022   ? ?  ? PT SHORT TERM GOAL #1  ? Title Pt will be independent with HEP in order to improve strength and decrease pain in order to improve pain-free function at home and work.   ? Baseline 07/20/21 HEP given; 2/27 continually progressing HEP with pt adherance   ? Time 4   ? Period Weeks   ? Status Achieved   ? ?  ?  ? ?  ? ? ? ? PT Long Term Goals - 09/30/21 0922   ? ?  ? PT LONG TERM GOAL #1  ? Title Pt will decrease worst pain as reported on NPRS by at least 3 points in order to demonstrate clinically significant reduction in pain.   ? Baseline 08/20/21: 9/10, 09/12/21: 8/10; 09/30/21: 5/10 NPS   ? Time 8   ? Period Weeks   ? Status Achieved   ? Target Date 09/30/21   ?  ? PT LONG TERM GOAL #2  ? Title Pt will increase gross periscapular strength to at least 4/5 MMT grade bilat in order to demonstrate improvement in strength and function   ? Baseline 07/20/21 R/L T scap retractors 4-/4; Y lower trap 3+/5; 09/12/21: T scap retractors 4-/5, Y low trap 4-/5; 09/30/21: T scap retraction: 4/4-  Y low trap: 4/4   ? Time 8   ? Period Weeks   ? Status  On-going   ? Target Date 11/25/21   ?  ? PT LONG TERM GOAL #3  ? Title Pt will demonstrate full active shoulder ROM in order to complete household ADLs and self care ADLs   ? Baseline  07/20/21 flex 141d; abd 122d; ER C7; IR T 12; 09/12/21: F WFL, ABD 141, ER T2, IR T9; 09/30/21: flexion:143 deg, abduction: 168 deg,  ER: C2/occiput, IR: T7 on RUE, T4 on LUE   ? Time 8   ? Period Weeks   ? Status On-going   ? Target Date 11/25/21   ?  ? PT LONG TERM GOAL #4  ? Title Patient will increase FOTO score to 64 to demonstrate predicted increase in functional mobility to complete ADLs   ? Baseline 07/20/21 54, 09/12/21: 59; 09/30/21: 59 with target of 64   ? Time 8   ? Period Weeks   ? Status On-going   ? Target Date 11/25/21   ?  ? PT LONG TERM GOAL #5  ? Title Pt will demonstrate </= 2/10 worst pain with cervical mobility and overhead ADL's to demonstrate further clinically significnat improvement with household tasks.   ? Baseline 09/30/21: 5/10 NPS   ? Time 8   ? Period Weeks   ? Status New   ? Target Date 11/25/21   ? ?  ?  ? ?  ? ? ? ? ? ? ? ? Plan - 10/10/21 1130   ? ? Clinical Impression Statement ROM restriction in cervical lateral flexion and rotation continues to be limited by 10-15 degrees to left and related to Rt upper traps tightness. Pt again tolerates similar interventions as in prior session. Today will issue supine retraction and A/ROM with overpressure to home program. Pt tolerates session well without any overt tenderness, perceives stretches as helpful.   ? Personal Factors and Comorbidities Comorbidity 2   ? Comorbidities OA, HTN , hypothyroid   ? Examination-Activity Limitations Bathing   ? Examination-Participation Restrictions Community Activity   ? Stability/Clinical Decision Making Evolving/Moderate complexity   ? Clinical Decision Making Moderate   ? Rehab Potential Good   ? PT Frequency 2x / week   ? PT Duration 8 weeks   ? PT Treatment/Interventions ADLs/Self Care Home Management;Cryotherapy;Electrical  Stimulation;Moist Heat;Traction;Ultrasound;DME Instruction;Aquatic Therapy;Fluidtherapy;Therapeutic exercise;Functional mobility training;Neuromuscular re-education;Manual techniques;Passive range of motion;Spi

## 2021-10-12 ENCOUNTER — Ambulatory Visit: Payer: Medicare HMO

## 2021-10-12 ENCOUNTER — Other Ambulatory Visit: Payer: Self-pay

## 2021-10-12 DIAGNOSIS — G8929 Other chronic pain: Secondary | ICD-10-CM | POA: Diagnosis not present

## 2021-10-12 DIAGNOSIS — M25511 Pain in right shoulder: Secondary | ICD-10-CM | POA: Diagnosis not present

## 2021-10-12 NOTE — Therapy (Signed)
[place] [facility] ?[address] ?[place], [place], [address] ?Phone: [phone]   Fax:  [phone] ? ?Physical Therapy Treatment ? ?Patient Details  ?Name: Meredith Scott ?MRN: [id number] ?Date of Birth: August 20, 1945 ?Referring Provider (PT): Bacigalupo MD ? ? ?Encounter Date: 10/12/2021 ? ? PT End of Session - 10/12/21 6269   ? ? Visit Number 18   ? Number of Visits 24   ? Date for PT Re-Evaluation 11/25/21   ? Authorization Type Aetna Medicare- [place] based on [place]   ? Authorization Time Period 09/30/21-11/25/21   ? Progress Note Due on Visit 20   ? PT Start Time [phone]   ? PT Stop Time [phone]   ? PT Time Calculation (min) 40 min   ? Activity Tolerance Patient tolerated treatment well;No increased pain   ? Behavior During Therapy [facility] for tasks assessed/performed   ? ?  ?  ? ?  ? ? ?Past Medical History:  ?Diagnosis Date  ? Anxiety   ? Asthma   ? Asymptomatic varicose veins 2011  ? Breast screening, unspecified 2013  ? Colon polyp   ? Diffuse cystic mastopathy 2013  ? Hernia of abdominal wall   ? History of hiatal hernia   ? Hypertension   ? Hypothyroidism   ? Dr Eddie Dibbles  ? Obesity, unspecified 2013  ? Osteoarthritis   ? PONV (postoperative nausea and vomiting)   ? Screening for obesity 2013  ? Skin cancer of face 2018  ? squamous/ left cheek  ? Special screening for malignant neoplasms, colon 2013  ? ? ?Past Surgical History:  ?Procedure Laterality Date  ? ABDOMINAL HYSTERECTOMY  1994  ? BREAST CYST ASPIRATION Right 1997?  ? breast aspiration d/t infected milk gland  ? BREAST MASS EXCISION Left 2011  ? COLONOSCOPY  2005  ? done by Dr. Jamal Collin  ? COLONOSCOPY N/A 01/27/2015  ? Procedure: COLONOSCOPY;  Surgeon: Christene Lye, MD;  Location: [facility] ENDOSCOPY;  Service: Endoscopy;  Laterality: N/A;  ? COLONOSCOPY WITH PROPOFOL N/A 02/13/2020  ? Procedure: COLONOSCOPY WITH PROPOFOL;  Surgeon: Robert Bellow, MD;  Location: [facility] ENDOSCOPY;  Service: Endoscopy;   Laterality: N/A;  ? [place] OF UTERUS  2007  ? FINE NEEDLE ASPIRATION    ? FOOT SURGERY Right   ? LIPOMA EXCISION  2010  ? breast bone  ? SKIN CANCER EXCISION Left 12/2016  ? under left eye, [facility]  ? TONGUE BIOPSY    ? ? ?There were no vitals filed for this visit. ? ? Subjective Assessment - 10/12/21 0920   ? ? Subjective Pt doing good today. Last night still struggling with posterior shoulder pain and intermittent deltoid lateral distribution of burning pain. Pt has been working on her new HEP additons for Rt UT stretch and retraction. Pt concerned that her Rt neck muscles tighten back up rather quickly.   ? Pertinent History Meredith Scott is a 76 year old female reporting with chronic R shoulder pain. She has had chronic R shoulder pain for several years, but reports pain was exacerbated last year with insideous onset. Reports pain is more posterior lateral, and bothers the R side of her neck as well. Reports pain is a stiff ache and comes on with aggravating activities such as reaching behind her and behind the back, vacuuming, carrying things, turning her head to the R, and overhead reaching. Lowest pain 2/10 ("it's always there"); worst 9/10; current 5/10. 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 She lives at home with her husband who helps with heavy household chores, though she is able to bath, dress, feed modI with some modifications needed for behind the back reaching. Patient is R handed. Denies any n/t. Pt denies N/V, B&B changes, unexplained weight fluctuation, saddle paresthesia, fever, night sweats, or unrelenting night pain at this time.   ? Currently in Pain? No/denies   ? ?  ?  ? ?  ? ? ?INTERVENTION THIS DATE:  ?aa/rom ON NUSTEP ?Heat to Right shoulder  ?Reassessment Cervical and shoulder  ROM ? ? ?Cervical ROM 10/05/21 Right  Left  ?Cervical Rotation (global)      ?Capital Rotation 58 [phone] in Jan '23) 75 (39 in Jan 23)  ?Cervical Lateral Flexion  28 (41 in Jan 23) 15 (20 in Jan 23)  ?Cervical Extension 46 degrees, firm end feel  ?Cervical Flexion 2 fingers chin to sternum   ?Shoulder External rotation  85 110  ?Shoulder Internal rotation  30 (60 after intervention)    36  ?      ?  ? ?Rt ER 85, IR 30 degrees  ?Left: 110ER, 36IR  ? ?Bilat shoulder flexion: 150 degrees pain free ?Bilat shoulder ABDCT WNL ? ?MFR to middle and posterior deltoid ?ART to Rt infraspinatus ?ART to Rt upper traps ? ? ? ? PT Education - 10/12/21 0949   ? ? Education Details concerns of spasm return   ? Person(s) Educated Patient   ? Methods Explanation   ? Comprehension Verbalized understanding;Returned demonstration   ? ?  ?  ? ?  ? ? ? PT Short Term Goals - 09/12/21 1022   ? ?  ? PT SHORT TERM GOAL #1  ? Title Pt will be independent with HEP in order to improve strength and decrease pain in order to improve pain-free function at home and work.   ? Baseline 07/20/21 HEP given; 2/27 continually progressing HEP with pt adherance   ? Time 4   ? Period Weeks   ? Status Achieved   ? ?  ?  ? ?  ? ? ? ? PT Long Term Goals - 09/30/21 0922   ? ?  ? PT LONG TERM GOAL #1  ? Title Pt will decrease worst pain as reported on NPRS by at least 3 points in order to demonstrate clinically significant reduction in pain.   ? Baseline 08/20/21: 9/10, 09/12/21: 8/10; 09/30/21: 5/10 NPS   ? Time 8   ? Period Weeks   ? Status Achieved   ? Target Date 09/30/21   ?  ? PT LONG TERM GOAL #2  ? Title Pt will increase gross periscapular strength to at least 4/5 MMT grade bilat in order to demonstrate improvement in strength and function   ? Baseline 07/20/21 R/L T scap retractors 4-/4; Y lower trap 3+/5; 09/12/21: T scap retractors 4-/5, Y low trap 4-/5; 09/30/21: T scap retraction: 4/4-  Y low trap: 4/4   ? Time 8   ? Period Weeks   ? Status On-going   ? Target Date  11/25/21   ?  ? PT LONG TERM GOAL #3  ? Title Pt will demonstrate full active shoulder ROM in order to complete household ADLs and self care ADLs   ?  Baseline 07/20/21 flex 141d; abd 122d; ER C7; IR T 12; 09/12/21: F WFL, ABD 141, ER T2, IR T9; 09/30/21: flexion:143 deg, abduction: 168 deg,  ER: C2/occiput, IR: T7 on RUE, T4 on LUE   ? Time 8   ? Period Weeks   ? Status On-going   ? Target Date 11/25/21   ?  ? PT LONG TERM GOAL #4  ? Title Patient will increase FOTO score to 64 to demonstrate predicted increase in functional mobility to complete ADLs   ? Baseline 07/20/21 54, 09/12/21: 59; 09/30/21: 59 with target of 64   ? Time 8   ? Period Weeks   ? Status On-going   ? Target Date 11/25/21   ?  ? PT LONG TERM GOAL #5  ? Title Pt will demonstrate </= 2/10 worst pain with cervical mobility and overhead ADL's to demonstrate further clinically significnat improvement with household tasks.   ? Baseline 09/30/21: 5/10 NPS   ? Time 8   ? Period Weeks   ? Status New   ? Target Date 11/25/21   ? ?  ?  ? ?  ? ? ? ? ? ? ? ? Plan - 10/12/21 0950   ? ? Clinical Impression Statement Pt now working on UT stretch during day, but improvements not long lasting. Her reassessment data today appear quite similar to evaluation values, but showing minimal improvements in ROM. Pt responds quit efavorably to soft tissue work at Rt deltoid, Right infraspinatus, Rt UT. Pt remains motivated and focused with program overall.   ? Personal Factors and Comorbidities Comorbidity 2   ? Comorbidities OA, HTN , hypothyroid   ? Examination-Activity Limitations Bathing   ? Examination-Participation Restrictions Community Activity   ? Stability/Clinical Decision Making Evolving/Moderate complexity   ? Clinical Decision Making Moderate   ? Rehab Potential Good   ? PT Frequency 2x / week   ? PT Duration 8 weeks   ? PT Treatment/Interventions ADLs/Self Care Home Management;Cryotherapy;Electrical Stimulation;Moist Heat;Traction;Ultrasound;DME Instruction;Aquatic  Therapy;Fluidtherapy;Therapeutic exercise;Functional mobility training;Neuromuscular re-education;Manual techniques;Passive range of motion;Spinal Manipulations;Joint Manipulations;Dry needling;Taping;Splintin

## 2021-10-17 ENCOUNTER — Ambulatory Visit: Payer: Medicare HMO | Attending: Family Medicine

## 2021-10-17 DIAGNOSIS — G8929 Other chronic pain: Secondary | ICD-10-CM | POA: Insufficient documentation

## 2021-10-17 DIAGNOSIS — M25511 Pain in right shoulder: Secondary | ICD-10-CM | POA: Diagnosis not present

## 2021-10-17 NOTE — Therapy (Signed)
[facility] ?[address] ?[place], [place], [address] ?Phone: [phone]   Fax:  [phone] ? ?Physical Therapy Treatment ? ?Patient Details  ?Name: Meredith Scott ?MRN: [id number] ?Date of Birth: 04/28/1946 ?Referring Provider (PT): Bacigalupo MD ? ? ?Encounter Date: 10/17/2021 ? ? PT End of Session - 10/17/21 0926   ? ? Visit Number 19   ? Number of Visits 24   ? Date for PT Re-Evaluation 11/25/21   ? Authorization Type [employer] Medicare- [place] based on [place]   ? Authorization Time Period 09/30/21-11/25/21   ? Progress Note Due on Visit 20   ? PT Start Time 4492   ? PT Stop Time 0955   ? PT Time Calculation (min) 40 min   ? Equipment Utilized During Treatment Gait belt   ? Activity Tolerance Patient tolerated treatment well;No increased pain   ? Behavior During Therapy [facility] for tasks assessed/performed   ? ?  ?  ? ?  ? ? ?Past Medical History:  ?Diagnosis Date  ? Anxiety   ? Asthma   ? Asymptomatic varicose veins 2011  ? Breast screening, unspecified 2013  ? Colon polyp   ? Diffuse cystic mastopathy 2013  ? Hernia of abdominal wall   ? History of hiatal hernia   ? Hypertension   ? Hypothyroidism   ? Dr Eddie Dibbles  ? Obesity, unspecified 2013  ? Osteoarthritis   ? PONV (postoperative nausea and vomiting)   ? Screening for obesity 2013  ? Skin cancer of face 2018  ? squamous/ left cheek  ? Special screening for malignant neoplasms, colon 2013  ? ? ?Past Surgical History:  ?Procedure Laterality Date  ? ABDOMINAL HYSTERECTOMY  1994  ? BREAST CYST ASPIRATION Right 1997?  ? breast aspiration d/t infected milk gland  ? BREAST MASS EXCISION Left 2011  ? COLONOSCOPY  2005  ? done by Dr. Jamal Collin  ? COLONOSCOPY N/A 01/27/2015  ? Procedure: COLONOSCOPY;  Surgeon: Christene Lye, MD;  Location[facility] ENDOSCOPY;  Service: Endoscopy;  Laterality: N/A;  ? COLONOSCOPY WITH PROPOFOL N/A 02/13/2020  ? Procedure: COLONOSCOPY WITH PROPOFOL;  Surgeon: Robert Bellow, MD;   Location: [facility] ENDOSCOPY;  Service: Endoscopy;  Laterality: N/A;  ? [place] OF UTERUS  2007  ? FINE NEEDLE ASPIRATION    ? FOOT SURGERY Right   ? LIPOMA EXCISION  2010  ? breast bone  ? SKIN CANCER EXCISION Left 12/2016  ? under left eye, [facility]  ? TONGUE BIOPSY    ? ? ?There were no vitals filed for this visit. ? ? Subjective Assessment - 10/17/21 0919   ? ? Subjective Pt reports she is doing ok today, had a good weekend. Pt reports about 24 hours of relief in Rt upper traps area, but tightness continues to return soon after. Pt is frustrated with short lasting affects of interventions, although her stretches at home do help.   ? Pertinent History Meredith Scott is a 76 year old female reporting with chronic R shoulder pain. She has had chronic R shoulder pain for several years, but reports pain was exacerbated last year with insideous onset. Reports pain is more posterior lateral, and bothers the R side of her neck as well. Reports pain is a stiff ache and comes on with aggravating activities such as reaching behind her and behind the back, vacuuming, carrying things, turning her head to the R, and overhead reaching. Lowest pain 2/10 ("it's always  there"); worst 9/10; current 5/10. 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 She lives at home with her husband who helps with heavy household chores, though she is able to bath, dress, feed modI with some modifications needed for behind the back reaching. Patient is R handed. Denies any n/t. Pt denies N/V, B&B changes, unexplained weight fluctuation, saddle paresthesia, fever, night sweats, or unrelenting night pain at this time.   ? Currently in Pain? No/denies   no pain , just tightness in Rt UT area  ? ?  ?  ? ?  ? ?Cervical ROM 10/05/21 Right  Left  ?Cervical  Rotation (global)      ?Capital Rotation 58 [phone] in Jan '23) 40 (39 in Jan 23)  ?Cervical Lateral Flexion  28 (41 in Jan 23) 15 (20 in Jan 23)  ?Cervical Extension 46 degrees, firm end feel  ?Cervical Flexion 2 fingers chin to sternum   ?Shoulder External rotation  85 110  ?Shoulder Internal rotation  30 (60 after intervention)    36  ?       ? ? ?INTERVENTION this date:  ? ?-aa/rom ON NUSTEP: seat 7, arms 8, level 2 x5 minutes  ?-Heat to Right shoulder  ? ?-Rt upper trap release 3x60sec  ?-Rt scalenes release 3x60sec ?-Rt first rib mobilization 2x30sec Grade 3 ?-Rt shoulder depression stretch 2x60sec (neutral head position) ?-cervical manual traction 2x45sec  ? ?---------------------------------------------------------------------------------- ? ?INTERVENTION THIS DATE: 3/29 ?aa/rom ON NUSTEP ?Heat to Right shoulder  ?Reassessment Cervical and shoulder ROM ?  ? ?Rt ER 85, IR 30 degrees  ?Left: 110ER, 36IR  ?  ?Bilat shoulder flexion: 150 degrees pain free ?Bilat shoulder ABDCT WNL ?  ?MFR to middle and posterior deltoid ?ART to Rt infraspinatus ?ART to Rt upper traps ? ?---------------------------------------------------------------------  ?INTERVENTION THIS DATE:  3/27 ?Cervical retraction into 2 pillows 15x3secH  ?Cervical retraction into 2 pillows (starting in 40 degrees flexion) 15x3secH  ?Left cervical rotation stretch with self over pressure in supine 3x30sec   ?Left Lateral flexion P/ROM stretch 2x30secH  ?ART to Rt upper traps margin paired with Left lateral flexion, Left rotation x5 minutes  ?Moist heat to Right UT ?Bilat pec minor stretch posterior tilt 2x30sec  ? ? ? PT Education - 10/17/21 0930   ? ? Education Details wants to decrease to 1x/week, but also wants to make sure her neck tightness is being addressed   ? Person(s) Educated Patient   ? Methods Explanation   ? Comprehension Verbalized understanding;Returned demonstration   ? ?  ?  ? ?  ? ? ? PT Short Term Goals - 09/12/21 1022   ? ?  ? PT SHORT  TERM GOAL #1  ? Title Pt will be independent with HEP in order to improve strength and decrease pain in order to improve pain-free function at home and work.   ? Baseline 07/20/21 HEP given; 2/27 continually progressing HEP with pt adherance   ? Time 4   ? Period Weeks   ? Status Achieved   ? ?  ?  ? ?  ? ? ? ? PT Long Term Goals - 09/30/21 0922   ? ?  ? PT LONG TERM GOAL #1  ? Title Pt will decrease worst pain as reported on NPRS by at  least 3 points in order to demonstrate clinically significant reduction in pain.   ? Baseline 08/20/21: 9/10, 09/12/21: 8/10; 09/30/21: 5/10 NPS   ? Time 8   ? Period Weeks   ? Status Achieved   ? Target Date 09/30/21   ?  ? PT LONG TERM GOAL #2  ? Title Pt will increase gross periscapular strength to at least 4/5 MMT grade bilat in order to demonstrate improvement in strength and function   ? Baseline 07/20/21 R/L T scap retractors 4-/4; Y lower trap 3+/5; 09/12/21: T scap retractors 4-/5, Y low trap 4-/5; 09/30/21: T scap retraction: 4/4-  Y low trap: 4/4   ? Time 8   ? Period Weeks   ? Status On-going   ? Target Date 11/25/21   ?  ? PT LONG TERM GOAL #3  ? Title Pt will demonstrate full active shoulder ROM in order to complete household ADLs and self care ADLs   ? Baseline 07/20/21 flex 141d; abd 122d; ER C7; IR T 12; 09/12/21: F WFL, ABD 141, ER T2, IR T9; 09/30/21: flexion:143 deg, abduction: 168 deg,  ER: C2/occiput, IR: T7 on RUE, T4 on LUE   ? Time 8   ? Period Weeks   ? Status On-going   ? Target Date 11/25/21   ?  ? PT LONG TERM GOAL #4  ? Title Patient will increase FOTO score to 64 to demonstrate predicted increase in functional mobility to complete ADLs   ? Baseline 07/20/21 54, 09/12/21: 59; 09/30/21: 59 with target of 64   ? Time 8   ? Period Weeks   ? Status On-going   ? Target Date 11/25/21   ?  ? PT LONG TERM GOAL #5  ? Title Pt will demonstrate </= 2/10 worst pain with cervical mobility and overhead ADL's to demonstrate further clinically significnat improvement with household  tasks.   ? Baseline 09/30/21: 5/10 NPS   ? Time 8   ? Period Weeks   ? Status New   ? Target Date 11/25/21   ? ?  ?  ? ?  ? ? ? ? ? ? ? ? Plan - 10/17/21 0932   ? ? Clinical Impression Statement Pt continues to in

## 2021-10-19 ENCOUNTER — Ambulatory Visit: Payer: Medicare HMO | Admitting: Physical Therapy

## 2021-10-24 ENCOUNTER — Ambulatory Visit (INDEPENDENT_AMBULATORY_CARE_PROVIDER_SITE_OTHER): Payer: Medicare HMO | Admitting: Physician Assistant

## 2021-10-24 ENCOUNTER — Encounter: Payer: Self-pay | Admitting: Physician Assistant

## 2021-10-24 VITALS — BP 144/68 | HR 58 | Ht 62.5 in | Wt 187.0 lb

## 2021-10-24 DIAGNOSIS — Z79890 Hormone replacement therapy (postmenopausal): Secondary | ICD-10-CM | POA: Insufficient documentation

## 2021-10-24 NOTE — Progress Notes (Signed)
? ?I,[name],acting as a Education administrator for [employer], PA-C.,have documented all relevant documentation on the behalf of Meredith Kirschner, PA-C,as directed by  Meredith Kirschner, PA-C while in the presence of Meredith Kirschner, PA-C. ? ?Established Patient Office Visit ? ?Subjective:  ?Patient ID: Meredith Scott, female    DOB: 1945-12-18  Age: 76 y.o. MRN: [id number] ? ?CC: HRT follow up  ? ? ?HPI ?Meredith Scott presents for discussion about her medication (estrogen). ? ?She had a total hysterectomy 1994 and started with oral estrogen for a few years. She was then transitioned to estratest. She reports now she takes one tablet every 6 days. Primarily she uses to prevent frequent UTIs. Last UTI was 2/22.  ? ?She reports understanding the risks, is up to date on mammograms.  ? ?Past Medical History:  ?Diagnosis Date  ? Anxiety   ? Asthma   ? Asymptomatic varicose veins 2011  ? Breast screening, unspecified 2013  ? Colon polyp   ? Diffuse cystic mastopathy 2013  ? Hernia of abdominal wall   ? History of hiatal hernia   ? Hypertension   ? Hypothyroidism   ? Dr Eddie Dibbles  ? Obesity, unspecified 2013  ? Osteoarthritis   ? PONV (postoperative nausea and vomiting)   ? Screening for obesity 2013  ? Skin cancer of face 2018  ? squamous/ left cheek  ? Special screening for malignant neoplasms, colon 2013  ? ? ?Past Surgical History:  ?Procedure Laterality Date  ? ABDOMINAL HYSTERECTOMY  1994  ? BREAST CYST ASPIRATION Right 1997?  ? breast aspiration d/t infected milk gland  ? BREAST MASS EXCISION Left 2011  ? COLONOSCOPY  2005  ? done by Dr. Jamal Collin  ? COLONOSCOPY N/A 01/27/2015  ? Procedure: COLONOSCOPY;  Surgeon: Christene Lye, MD;  Location: [facility] ENDOSCOPY;  Service: Endoscopy;  Laterality: N/A;  ? COLONOSCOPY WITH PROPOFOL N/A 02/13/2020  ? Procedure: COLONOSCOPY WITH PROPOFOL;  Surgeon: Robert Bellow, MD;  Location: [facility] ENDOSCOPY;  Service: Endoscopy;  Laterality: N/A;  ? [place] OF UTERUS  2007  ?  FINE NEEDLE ASPIRATION    ? FOOT SURGERY Right   ? LIPOMA EXCISION  2010  ? breast bone  ? SKIN CANCER EXCISION Left 12/2016  ? under left eye, [facility]  ? TONGUE BIOPSY    ? ? ?Family History  ?Problem Relation Age of Onset  ? Liver cancer Mother   ? Thyroid disease Mother   ? Diabetes Father   ? Healthy Sister   ? Dementia Sister   ? Healthy Brother   ? Lung cancer Maternal Uncle   ? Arthritis Granddaughter   ? Lupus Granddaughter   ? Arthritis Granddaughter   ? Lupus Granddaughter   ? Breast cancer Other   ? ? ?Social History  ? ?Socioeconomic History  ? Marital status: Married  ?  Spouse name: Not on file  ? Number of children: 1  ? Years of education: H/S  ? Highest education level: High school graduate  ?Occupational History  ? Occupation: Retired  ?Tobacco Use  ? Smoking status: Never  ? Smokeless tobacco: Never  ?Vaping Use  ? Vaping Use: Never used  ?Substance and Sexual Activity  ? Alcohol use: No  ? Drug use: No  ? Sexual activity: Not Currently  ?Other Topics Concern  ? Not on file  ?Social History Narrative  ? Not on file  ? ?Social Determinants of Health  ? ?Financial Resource Strain: Low Risk   ?  Difficulty of Paying Living Expenses: Not hard at all  ?Food Insecurity: No Food Insecurity  ? Worried About Charity fundraiser in the Last Year: Never true  ? Ran Out of Food in the Last Year: Never true  ?Transportation Needs: No Transportation Needs  ? Lack of Transportation (Medical): No  ? Lack of Transportation (Non-Medical): No  ?Physical Activity: Unknown  ? Days of Exercise per Week: 3 days  ? Minutes of Exercise per Session: Not on file  ?Stress: No Stress Concern Present  ? Feeling of Stress : Not at all  ?Social Connections: Moderately Integrated  ? Frequency of Communication with Friends and Family: More than three times a week  ? Frequency of Social Gatherings with Friends and Family: Once a week  ? Attends Religious Services: More than 4 times per year  ? Active Member of Clubs or  Organizations: No  ? Attends Archivist Meetings: Never  ? Marital Status: Married  ?Intimate Partner Violence: Not At Risk  ? Fear of Current or Ex-Partner: No  ? Emotionally Abused: No  ? Physically Abused: No  ? Sexually Abused: No  ? ? ?Outpatient Medications Prior to Visit  ?Medication Sig Dispense Refill  ? Ascorbic Acid (VITAMIN C PO) Take 500 mg by mouth daily.     ? ascorbic acid (VITAMIN C) 500 MG tablet Take by mouth.     ? aspirin 81 MG tablet Take 81 mg by mouth daily.    ? calcium-vitamin D (OSCAL WITH D) 500-200 MG-UNIT tablet Take 1 tablet by mouth daily with breakfast.     ? Cholecalciferol (VITAMIN D3) 50 MCG (2000 UT) CAPS Take 1 capsule by mouth daily.    ? escitalopram (LEXAPRO) 10 MG tablet Take 0.5 tablets (5 mg total) by mouth daily. 45 tablet 1  ? estrogen-methylTESTOSTERone (EST ESTROGENS-METHYLTEST HS) 0.625-1.25 MG tablet 1 daily as directed 10 tablet 0  ? Fish Oil OIL by Does not apply route daily.     ? Ipratropium-Albuterol (COMBIVENT RESPIMAT) 20-100 MCG/ACT AERS respimat Inhale 1 puff into the lungs every 6 (six) hours as needed for wheezing. 4 each 3  ? levothyroxine (SYNTHROID, LEVOTHROID) 25 MCG tablet Take 25 mcg by mouth daily before breakfast. 2 on Sunday and Thursday    ? melatonin 3 MG TABS tablet Take 3 mg by mouth at bedtime.    ? metroNIDAZOLE (METROCREAM) 0.75 % cream     ? PFIZER-BIONT COVID-19 VAC-TRIS SUSP injection     ? SYMBICORT 160-4.5 MCG/ACT inhaler INHALE 2 PUFFS INTO THE LUNGS TWICE DAILY 10.2 g 3  ? Wheat Dextrin (BENEFIBER PO) Take by mouth daily. Or as EOD    ? WHEAT DEXTRIN PO Take by mouth.    ? zafirlukast (ACCOLATE) 20 MG tablet Take 1 tablet (20 mg total) by mouth 2 (two) times daily before a meal. 60 tablet 1  ? ?No facility-administered medications prior to visit.  ? ? ?Allergies  ?Allergen Reactions  ? Shellfish Allergy Other (See Comments)  ?  Throat closes  ? Sulfa Antibiotics Swelling  ? Neosporin [Neomycin-Bacitracin Zn-Polymyx] Rash   ? ? ?ROS ?Review of Systems  ?Constitutional:  Negative for fatigue and fever.  ?Respiratory:  Negative for cough and shortness of breath.   ?Cardiovascular:  Negative for chest pain and leg swelling.  ?Gastrointestinal:  Negative for abdominal pain.  ?Neurological:  Negative for dizziness and headaches.  ? ?  ?Objective:  ?  ?Physical Exam ?Constitutional:   ?  Appearance: Normal appearance. She is not ill-appearing.  ?HENT:  ?   Head: Normocephalic.  ?Eyes:  ?   Conjunctiva/sclera: Conjunctivae normal.  ?Cardiovascular:  ?   Rate and Rhythm: Normal rate and regular rhythm.  ?   Pulses: Normal pulses.  ?   Heart sounds: Normal heart sounds.  ?Pulmonary:  ?   Effort: Pulmonary effort is normal.  ?   Breath sounds: Normal breath sounds.  ?Neurological:  ?   Mental Status: She is oriented to person, place, and time.  ?Psychiatric:     ?   Mood and Affect: Mood normal.     ?   Behavior: Behavior normal.  ? ? ?There were no vitals taken for this visit. ?Wt Readings from Last 3 Encounters:  ?08/16/21 183 lb 4.8 oz (83.1 kg)  ?06/20/21 187 lb 6.4 oz (85 kg)  ?04/21/21 185 lb 14.4 oz (84.3 kg)  ? ? ? ?Health Maintenance Due  ?Topic Date Due  ? TETANUS/TDAP  04/20/2021  ? ? ?There are no preventive care reminders to display for this patient. ? ?Lab Results  ?Component Value Date  ? TSH 2.910 06/28/2021  ? ?Lab Results  ?Component Value Date  ? WBC 6.5 06/14/2020  ? HGB 14.2 06/14/2020  ? HCT 41.3 06/14/2020  ? MCV 93 06/14/2020  ? PLT 273 06/14/2020  ? ?Lab Results  ?Component Value Date  ? NA 143 06/28/2021  ? K 4.2 06/28/2021  ? CO2 27 06/28/2021  ? GLUCOSE 109 (H) 06/28/2021  ? BUN 15 06/28/2021  ? CREATININE 0.87 06/28/2021  ? BILITOT 0.6 06/28/2021  ? ALKPHOS 82 06/28/2021  ? AST 17 06/28/2021  ? ALT 15 06/28/2021  ? PROT 6.7 06/28/2021  ? ALBUMIN 4.4 06/28/2021  ? CALCIUM 9.6 06/28/2021  ? EGFR 70 06/28/2021  ? ?Lab Results  ?Component Value Date  ? CHOL 194 06/28/2021  ? ?Lab Results  ?Component Value Date  ? HDL  55 06/28/2021  ? ?Lab Results  ?Component Value Date  ? LDLCALC 125 (H) 06/28/2021  ? ?Lab Results  ?Component Value Date  ? TRIG 75 06/28/2021  ? ?Lab Results  ?Component Value Date  ? CHOLHDL 3.5 12/1

## 2021-10-24 NOTE — Assessment & Plan Note (Signed)
Currently takes estrogen/methyltestosterone 10 mg once every 6 days.  ?Next rx will reflect such use. ?Discussed long term side effects of estrogen therapy. ?Discussed other options ie vaginal estrogen. ?For now pt will continue use, she will continue with mammograms years and self breast exams. ?

## 2021-10-26 ENCOUNTER — Encounter: Payer: Medicare HMO | Admitting: Physical Therapy

## 2021-10-28 ENCOUNTER — Ambulatory Visit: Payer: Medicare HMO

## 2021-10-28 DIAGNOSIS — G8929 Other chronic pain: Secondary | ICD-10-CM

## 2021-10-28 DIAGNOSIS — M25511 Pain in right shoulder: Secondary | ICD-10-CM | POA: Diagnosis not present

## 2021-10-28 NOTE — Therapy (Signed)
[place] [facility] ?[address] ?[place], [place], [address] ?Phone: [phone]   Fax:  [phone] ? ?Physical Therapy Treatment Physical Therapy Progress Note ? ? ?Dates of reporting period  09/12/21   to   10/28/21 ? ? ?Patient Details  ?Name: Meredith Scott ?MRN: [id number] ?Date of Birth: 1945-11-12 ?Referring Provider (PT): Bacigalupo MD ? ? ?Encounter Date: 10/28/2021 ? ? PT End of Session - 10/28/21 1025   ? ? Visit Number 20   ? Number of Visits 24   ? Date for PT Re-Evaluation 11/25/21   ? Authorization Type Aetna Medicare- [place] based on [place]   ? Authorization Time Period 09/30/21-11/25/21   ? Progress Note Due on Visit 20   ? PT Start Time [phone]   ? PT Stop Time 0940   ? PT Time Calculation (min) 30 min   ? Activity Tolerance Patient tolerated treatment well;No increased pain   ? ?  ?  ? ?  ? ? ?Past Medical History:  ?Diagnosis Date  ? Anxiety   ? Asthma   ? Asymptomatic varicose veins 2011  ? Breast screening, unspecified 2013  ? Colon polyp   ? Diffuse cystic mastopathy 2013  ? Hernia of abdominal wall   ? History of hiatal hernia   ? Hypertension   ? Hypothyroidism   ? Dr Eddie Dibbles  ? Obesity, unspecified 2013  ? Osteoarthritis   ? PONV (postoperative nausea and vomiting)   ? Screening for obesity 2013  ? Skin cancer of face 2018  ? squamous/ left cheek  ? Special screening for malignant neoplasms, colon 2013  ? ? ?Past Surgical History:  ?Procedure Laterality Date  ? ABDOMINAL HYSTERECTOMY  1994  ? BREAST CYST ASPIRATION Right 1997?  ? breast aspiration d/t infected milk gland  ? BREAST MASS EXCISION Left 2011  ? COLONOSCOPY  2005  ? done by Dr. Jamal Collin  ? COLONOSCOPY N/A 01/27/2015  ? Procedure: COLONOSCOPY;  Surgeon: Christene Lye, MD;  Location: [facility] ENDOSCOPY;  Service: Endoscopy;  Laterality: N/A;  ? COLONOSCOPY WITH PROPOFOL N/A 02/13/2020  ? Procedure: COLONOSCOPY WITH PROPOFOL;  Surgeon: Robert Bellow, MD;  Location[facility] ENDOSCOPY;   Service: Endoscopy;  Laterality: N/A;  ? FINE NEEDLE ASPIRATION    ? FOOT SURGERY Right   ? LIPOMA EXCISION  2010  ? breast bone  ? SKIN CANCER EXCISION Left 12/2016  ? under left eye, [facility]  ? TONGUE BIOPSY    ? ? ?There were no vitals filed for this visit. ? ? Subjective Assessment - 10/28/21 0917   ? ? Subjective Pt still having same stiffness in neck without LT releif. Still working on her shoulder HEP with success. Saw her massage therapist which continues to help, as does heat application at home. She in anxious about continued workup with MD due to prior offers for cortisone injection.   ? Pertinent History Meredith Scott is a 76 year old female reporting with chronic R shoulder pain. She has had chronic R shoulder pain for several years, but reports pain was exacerbated last year with insideous onset. Reports pain is more posterior lateral, and bothers the R side of her neck as well. Reports pain is a stiff ache and comes on with aggravating activities such as reaching behind her and behind the back, vacuuming, carrying things, turning her head to the R, and overhead reaching. Lowest pain 2/10 ("it's always there"); worst 9/10; current 5/10. 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 She lives at home with her husband who helps with heavy household chores, though she is able to bath, dress, feed modI with some modifications needed for behind the back reaching. Patient is R handed. Denies any n/t. Pt denies N/V, B&B changes, unexplained weight fluctuation, saddle paresthesia, fever, night sweats, or unrelenting night pain at this time.   ? Currently in Pain? No/denies   ? ?  ?  ? ?  ? ? ?Cervical ROM 10/05/21 Right 10/05/21 Left 10/05/21 Right- initital Left- initital  ?Cervical Rotation (global)      ?Capital Rotation  [phone]  ?Cervical Lateral Flexion  28 15 41 20  ?Cervical Extension 46 degrees, firm end feel   ?Cervical Flexion 2 fingers chin to sternum   ?Shoulder External rotation 85 110    ?Shoulder Internal rotation 30 (60 after intervention) 36    ?  ? ?Shoulder ROM Assessment     ?  Right 10/12/21 Left 10/12/21 Right 07/20/21 Left 07/20/21  ?Flexion A/ROM WFL (150) WFL (150) 180 141  ?ABDCT A/ROM  [facility] WFL 180 122  ?External rotation   CTJ C7  ?Internal Rotation    T10 T12  ?Extension A/ROM    60 60  ?External rotation (P/ROM) 85 30    ?Internal Rotation (P/ROM)  110 36    ?      ?Shoulder Strength Assessment     ?  Right 10/28/21 Left 10/28/21 Right 07/20/21 Left 07/20/21  ?Shoulder Flexion   5/5 5/5  ?Shoulder Abduction   5/5 4+/5  ?External rotation   5/5 4/5*  ?Internal Rotation    5/5 5/5  ?Shoulder Extension   5/5 5/5  ?Middle Trapezius    4-/5 4/5  ?*indicates pain  ? ? ?Intervention this date:  ?-AA/ROM on Nustep seat 7, arms 9 x 6 minutes  ?-TPDN and soft tissue assessment Rt UT, supple, no clear trigger points, no stretch in supine ?-Neural tension tests negative/inconclusive in supine and in sitting ?-education need for additional workup needed for neck  ? ? ?----------------------------------------------------------------------- ?INTERVENTION 10/17/21:  ?-aa/rom ON NUSTEP: seat 7, arms 8, level 2 x5 minutes  ?-Heat to Right shoulder  ?  ?-Rt upper trap release 3x60sec  ?-Rt scalenes release 3x60sec ?-Rt first rib mobilization 2x30sec Grade 3 ?-Rt shoulder depression stretch 2x60sec (neutral head position) ?-cervical manual traction 2x45sec  ?  ?---------------------------------------------------------------------------------- ?INTERVENTION THIS DATE: 3/29 ?aa/rom ON NUSTEP ?Heat to Right shoulder  ?Reassessment Cervical and shoulder ROM ?  ?Rt ER 85, IR 30 degrees  ?Left: 110ER, 36IR  ?  ?Bilat shoulder flexion: 150 degrees pain free ?Bilat shoulder ABDCT WNL ?  ?MFR to middle and posterior deltoid ?ART to Rt  infraspinatus ?ART to Rt upper traps ?  ?---------------------------------------------------------------------  ?INTERVENTION THIS DATE:             3/27 ?Cervical retraction into 2 pillows 15x3secH  ?Cervical retraction into 2 pillows (starting in 40 degrees flexion) 15x3secH  ?Left cervical rotation stretch with self over pressure in supine 3x30sec   ?Left Lateral flexion P/ROM stretch 2x30secH  ?ART to Rt upper traps margin paired with Left lateral flexion, Left rotation x5 minutes  ?Moist heat to Right UT ?Bilat pec minor stretch posterior tilt 2x30sec  ? ? ? ?  PT Education - 10/28/21 1039   ? ? Education Details additional workup for neck is warranted, neck response to therapy has plateaued   ? Person(s) Educated Patient   ? Methods Explanation;Demonstration   ? Comprehension Verbalized understanding;Returned demonstration;Need further instruction   ? ?  ?  ? ?  ? ? ? PT Short Term Goals - 09/12/21 1022   ? ?  ? PT SHORT TERM GOAL #1  ? Title Pt will be independent with HEP in order to improve strength and decrease pain in order to improve pain-free function at home and work.   ? Baseline 07/20/21 HEP given; 2/27 continually progressing HEP with pt adherance   ? Time 4   ? Period Weeks   ? Status Achieved   ? ?  ?  ? ?  ? ? ? ? PT Long Term Goals - 09/30/21 0922   ? ?  ? PT LONG TERM GOAL #1  ? Title Pt will decrease worst pain as reported on NPRS by at least 3 points in order to demonstrate clinically significant reduction in pain.   ? Baseline 08/20/21: 9/10, 09/12/21: 8/10; 09/30/21: 5/10 NPS   ? Time 8   ? Period Weeks   ? Status Achieved   ? Target Date 09/30/21   ?  ? PT LONG TERM GOAL #2  ? Title Pt will increase gross periscapular strength to at least 4/5 MMT grade bilat in order to demonstrate improvement in strength and function   ? Baseline 07/20/21 R/L T scap retractors 4-/4; Y lower trap 3+/5; 09/12/21: T scap retractors 4-/5, Y low trap 4-/5; 09/30/21: T scap retraction: 4/4-  Y low trap: 4/4   ? Time 8   ?  Period Weeks   ? Status On-going   ? Target Date 11/25/21   ?  ? PT LONG TERM GOAL #3  ? Title Pt will demonstrate full active shoulder ROM in order to complete household ADLs and self care ADLs   ? Baseline 1/

## 2021-11-01 ENCOUNTER — Ambulatory Visit: Payer: Medicare HMO | Admitting: Physical Therapy

## 2021-11-01 ENCOUNTER — Encounter: Payer: Self-pay | Admitting: Physician Assistant

## 2021-11-01 ENCOUNTER — Encounter: Payer: Self-pay | Admitting: Physical Therapy

## 2021-11-01 DIAGNOSIS — G8929 Other chronic pain: Secondary | ICD-10-CM

## 2021-11-01 DIAGNOSIS — M25511 Pain in right shoulder: Secondary | ICD-10-CM | POA: Diagnosis not present

## 2021-11-01 NOTE — Therapy (Signed)
[place] [facility] ?[address] ?[place], [place], [address] ?Phone: [phone]   Fax:  [phone] ? ?Physical Therapy Treatment ? ?Patient Details  ?Name: Meredith Scott ?MRN: [id number] ?Date of Birth: March 21, 1946 ?Referring Provider (PT): Bacigalupo MD ? ? ?Encounter Date: 11/01/2021 ? ? PT End of Session - 11/01/21 1254   ? ? Visit Number 21   ? Number of Visits 24   ? Date for PT Re-Evaluation 11/25/21   ? Authorization Type Aetna Medicare- [place] based on [place]   ? Authorization Time Period 09/30/21-11/25/21   ? Progress Note Due on Visit 30   ? PT Start Time 1002   ? PT Stop Time 1045   ? PT Time Calculation (min) 43 min   ? Activity Tolerance Patient tolerated treatment well;No increased pain   ? Behavior During Therapy [facility] for tasks assessed/performed   ? ?  ?  ? ?  ? ? ?Past Medical History:  ?Diagnosis Date  ? Anxiety   ? Asthma   ? Asymptomatic varicose veins 2011  ? Breast screening, unspecified 2013  ? Colon polyp   ? Diffuse cystic mastopathy 2013  ? Hernia of abdominal wall   ? History of hiatal hernia   ? Hypertension   ? Hypothyroidism   ? Dr Eddie Dibbles  ? Obesity, unspecified 2013  ? Osteoarthritis   ? PONV (postoperative nausea and vomiting)   ? Screening for obesity 2013  ? Skin cancer of face 2018  ? squamous/ left cheek  ? Special screening for malignant neoplasms, colon 2013  ? ? ?Past Surgical History:  ?Procedure Laterality Date  ? ABDOMINAL HYSTERECTOMY  1994  ? BREAST CYST ASPIRATION Right 1997?  ? breast aspiration d/t infected milk gland  ? BREAST MASS EXCISION Left 2011  ? COLONOSCOPY  2005  ? done by Dr. Jamal Collin  ? COLONOSCOPY N/A 01/27/2015  ? Procedure: COLONOSCOPY;  Surgeon: Christene Lye, MD;  Location: [facility] ENDOSCOPY;  Service: Endoscopy;  Laterality: N/A;  ? COLONOSCOPY WITH PROPOFOL N/A 02/13/2020  ? Procedure: COLONOSCOPY WITH PROPOFOL;  Surgeon: Robert Bellow, MD;  Location: [facility] ENDOSCOPY;  Service: Endoscopy;   Laterality: N/A;  ? FINE NEEDLE ASPIRATION    ? FOOT SURGERY Right   ? LIPOMA EXCISION  2010  ? breast bone  ? SKIN CANCER EXCISION Left 12/2016  ? under left eye, [facility]  ? TONGUE BIOPSY    ? ? ?There were no vitals filed for this visit. ? ? Subjective Assessment - 11/01/21 1006   ? ? Subjective Pt still having same stiffness in neck without long-term relief. She has decreased weight used during HEP and uses heat following HEP - both seem to help with discomfort.   ? Pertinent History Meredith Scott is a 76 year old female reporting with chronic R shoulder pain. She has had chronic R shoulder pain for several years, but reports pain was exacerbated last year with insideous onset. Reports pain is more posterior lateral, and bothers the R side of her neck as well. Reports pain is a stiff ache and comes on with aggravating activities such as reaching behind her and behind the back, vacuuming, carrying things, turning her head to the R, and overhead reaching. Lowest pain 2/10 ("it's always there"); worst 9/10; current 5/10. 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 She lives at home with her husband who helps with heavy household chores, though she is able to bath, dress, feed modI with some modifications needed for behind the back reaching. Patient is R handed. Denies any n/t. Pt denies N/V, B&B changes, unexplained weight fluctuation, saddle paresthesia, fever, night sweats, or unrelenting night pain at this time.   ? Limitations Lifting;House hold activities;Sitting   ? How long can you sit comfortably? unlimited   ? How long can you stand comfortably? unlimited   ? How long can you walk comfortably? unlimited   ? Diagnostic tests None since 2016   ? Currently in Pain? No/denies   ? Pain Onset More than a month ago   ? ?  ?   ? ?  ? ? ? ?INTERVENTION ?-NUSTEP: seat 6, arms 9, level 2 x5 minutes  ? ?Trigger Point Dry Needling (TDN), unbilled. Education performed with patient regarding potential benefit of TDN. Reviewed precautions and risks with patient. Pt provided verbal consent to treatment. TDN performed with 0.30 x 30 single needle placements with local twitch response (LTR). Pistoning technique utilized. Improved pain-free motion following intervention. Muscles targeted: R upper trap  ? ?-Rt upper trap release 3x60sec  ?-Rt scalenes release 3x60sec ?-Rt first rib mobilization 2x30sec Grade 3 ?-Rt shoulder depression stretch 2x60sec (neutral head position) ?-Rt trap stretch with cervical lateral flexion 2x60 seconds ?-cervical manual traction 2x45sec  ?-supine cervical retraction x12 ?-supine cervical rotation stretch x30 seconds bilaterally  ?  ? ?Cervical rotation reassessment ?Left: 64 degrees  ?Right: 56 degrees  ? ? ? ?Clinical Impression: Pt is pleasant and motivated throughout session. She presents with increased tightness throughout R upper trap compared to left. TDN was performed with immediate relief. Additional manual techniques performed to enhance relaxation of the upper trap and improve cervical ROM. Cervical rotation ROM did improve at end of session with final measurements of 64 degrees (left) and 56 degrees (right). Discussed probable d/c next session; encouraged pt to ensure comfort with HEP and to follow up with scheduling an appointment with MD regarding inability to maintain long-term effects of improved cervical ROM. Pt agreeable to plan.  ? ? ? ? ? PT Short Term Goals - 09/12/21 1022   ? ?  ? PT SHORT TERM GOAL #1  ? Title Pt will be independent with HEP in order to improve strength and decrease pain in order to improve pain-free function at home and work.   ? Baseline 07/20/21 HEP given; 2/27 continually progressing HEP with pt adherance   ? Time 4   ? Period Weeks   ? Status Achieved   ? ?  ?  ? ?  ? ? ? ? PT  Long Term Goals - 09/30/21 0922   ? ?  ? PT LONG TERM GOAL #1  ? Title Pt will decrease worst pain as reported on NPRS by at least 3 points in order to demonstrate clinically significant reduction in pain.   ? Baseline 08/20/21: 9/10, 09/12/21: 8/10; 09/30/21: 5/10 NPS   ? Time 8   ? Period Weeks   ? Status Achieved   ? Target Date 09/30/21   ?  ? PT LONG TERM GOAL #2  ? Title Pt will increase gross periscapular strength to at least 4/5 MMT grade bilat in order to demonstrate improvement in strength and function   ? Baseline 07/20/21 R/L T scap retractors 4-/4; Y lower trap  3+/5; 09/12/21: T scap retractors 4-/5, Y low trap 4-/5; 09/30/21: T scap retraction: 4/4-  Y low trap: 4/4   ? Time 8   ? Period Weeks   ? Status On-going   ? Target Date 11/25/21   ?  ? PT LONG TERM GOAL #3  ? Title Pt will demonstrate full active shoulder ROM in order to complete household ADLs and self care ADLs   ? Baseline 07/20/21 flex 141d; abd 122d; ER C7; IR T 12; 09/12/21: F WFL, ABD 141, ER T2, IR T9; 09/30/21: flexion:143 deg, abduction: 168 deg,  ER: C2/occiput, IR: T7 on RUE, T4 on LUE   ? Time 8   ? Period Weeks   ? Status On-going   ? Target Date 11/25/21   ?  ? PT LONG TERM GOAL #4  ? Title Patient will increase FOTO score to 64 to demonstrate predicted increase in functional mobility to complete ADLs   ? Baseline 07/20/21 54, 09/12/21: 59; 09/30/21: 59 with target of 64   ? Time 8   ? Period Weeks   ? Status On-going   ? Target Date 11/25/21   ?  ? PT LONG TERM GOAL #5  ? Title Pt will demonstrate </= 2/10 worst pain with cervical mobility and overhead ADL's to demonstrate further clinically significnat improvement with household tasks.   ? Baseline 09/30/21: 5/10 NPS   ? Time 8   ? Period Weeks   ? Status New   ? Target Date 11/25/21   ? ?  ?  ? ?  ? ? ? ? ? ? ? ? Plan - 11/01/21 1515   ? ? Clinical Impression Statement Pt is pleasant and motivated throughout session. She presents with increased tightness throughout R upper trap compared to  left. TDN was performed with immediate relief. Additional manual techniques performed to enhance relaxation of the upper trap and improve cervical ROM. Cervical rotation ROM did improve at end of session with

## 2021-11-02 ENCOUNTER — Other Ambulatory Visit: Payer: Self-pay | Admitting: Physician Assistant

## 2021-11-02 DIAGNOSIS — G8929 Other chronic pain: Secondary | ICD-10-CM

## 2021-11-02 DIAGNOSIS — M542 Cervicalgia: Secondary | ICD-10-CM

## 2021-11-03 ENCOUNTER — Encounter: Payer: Medicare HMO | Admitting: Physical Therapy

## 2021-11-07 DIAGNOSIS — M5412 Radiculopathy, cervical region: Secondary | ICD-10-CM | POA: Diagnosis not present

## 2021-11-08 ENCOUNTER — Encounter: Payer: Self-pay | Admitting: Physical Therapy

## 2021-11-08 ENCOUNTER — Ambulatory Visit: Payer: Medicare HMO | Admitting: Physical Therapy

## 2021-11-08 DIAGNOSIS — M25511 Pain in right shoulder: Secondary | ICD-10-CM | POA: Diagnosis not present

## 2021-11-08 DIAGNOSIS — G8929 Other chronic pain: Secondary | ICD-10-CM | POA: Diagnosis not present

## 2021-11-08 NOTE — Therapy (Signed)
[place] [facility] ?[address] ?[place], [place], [address] ?Phone: [phone]   Fax:  [phone] ? ?Physical Therapy Discharge Summary ? ? ?Dates of reporting period  08/22/21   to   11/08/21 ? ? ?Patient Details  ?Name: Meredith Scott ?MRN: [id number] ?Date of Birth: Mar 05, 1946 ?Referring Provider (PT): Bacigalupo MD ? ? ?Encounter Date: 11/08/2021 ? ? PT End of Session - 11/08/21 1247   ? ? Visit Number 22   ? Number of Visits 24   ? Date for PT Re-Evaluation 11/25/21   ? Authorization Type [employer] Medicare- [place] based on [place]   ? Authorization Time Period 09/30/21-11/25/21   ? Progress Note Due on Visit 30   ? PT Start Time [phone]   ? PT Stop Time 1022   ? PT Time Calculation (min) 30 min   ? Activity Tolerance Patient tolerated treatment well;No increased pain   ? Behavior During Therapy [facility] for tasks assessed/performed   ? ?  ?  ? ?  ? ? ?Past Medical History:  ?Diagnosis Date  ? Anxiety   ? Asthma   ? Asymptomatic varicose veins 2011  ? Breast screening, unspecified 2013  ? Colon polyp   ? Diffuse cystic mastopathy 2013  ? Hernia of abdominal wall   ? History of hiatal hernia   ? Hypertension   ? Hypothyroidism   ? Dr Eddie Dibbles  ? Obesity, unspecified 2013  ? Osteoarthritis   ? PONV (postoperative nausea and vomiting)   ? Screening for obesity 2013  ? Skin cancer of face 2018  ? squamous/ left cheek  ? Special screening for malignant neoplasms, colon 2013  ? ? ?Past Surgical History:  ?Procedure Laterality Date  ? ABDOMINAL HYSTERECTOMY  1994  ? BREAST CYST ASPIRATION Right 1997?  ? breast aspiration d/t infected milk gland  ? BREAST MASS EXCISION Left 2011  ? COLONOSCOPY  2005  ? done by Dr. Jamal Collin  ? COLONOSCOPY N/A 01/27/2015  ? Procedure: COLONOSCOPY;  Surgeon: Christene Lye, MD;  Location: [facility] ENDOSCOPY;  Service: Endoscopy;  Laterality: N/A;  ? COLONOSCOPY WITH PROPOFOL N/A 02/13/2020  ? Procedure: COLONOSCOPY WITH PROPOFOL;  Surgeon: Robert Bellow, MD;  Location[facility] ENDOSCOPY;  Service: Endoscopy;  Laterality: N/A;  ? FINE NEEDLE ASPIRATION    ? FOOT SURGERY Right   ? LIPOMA EXCISION  2010  ? breast bone  ? SKIN CANCER EXCISION Left 12/2016  ? under left eye, [facility]  ? TONGUE BIOPSY    ? ? ?There were no vitals filed for this visit. ? ? Subjective Assessment - 11/08/21 1036   ? ? Subjective Pt arrives asking to discuss recent imaging results and treatment options. She went to [facility] for imaging of cervical spine - results show OA. Pt now taking Meloxicam and muscle relaxer. Awaiting MRI.   ? Pertinent History Meredith Scott is a 76 year old female reporting with chronic R shoulder pain. She has had chronic R shoulder pain for several years, but reports pain was exacerbated last year with insideous onset. Reports pain is more posterior lateral, and bothers the R side of her neck as well. Reports pain is a stiff ache and comes on with aggravating activities such as reaching behind her and behind the back, vacuuming, carrying things, turning her head to the R, and overhead reaching. Lowest pain 2/10 ("it's always there"); worst 9/10; current 5/10. 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 She lives at home with her husband who helps with heavy household chores, though she is able to bath, dress, feed modI with some modifications needed for behind the back reaching. Patient is R handed. Denies any n/t. Pt denies N/V, B&B changes, unexplained weight fluctuation, saddle paresthesia, fever, night sweats, or unrelenting night pain at this time.   ? Limitations Lifting;House hold activities;Sitting   ? How long can you sit comfortably? unlimited   ? How long can you stand comfortably? unlimited   ? How long can you walk comfortably? unlimited   ? Diagnostic tests  None since 2016   ? Currently in Pain? No/denies   ? Pain Onset More than a month ago   ? ?  ?  ? ?  ? ? ? ?DISCHARGE - Objective Measures  ? ?Pt education: PT and pt discussed recent x-ray results including OA in cervical spine, prognosis, treatment options. Gave general overview of OA and why medications can not "cure" OA. Also educated on purpose of recommended MRI (referred to MD note that states to check for HNP). PT educated on what a herniated disc is and treatment options. Pt describes uneasiness taking newly prescribed Meloxicam due to potential side effects however she has been compliant thus far. PT encouraged pt to follow up with MD and inquire about other potential medications that could be taken with less side effects.  ? ?Reviewed HEP. Added cervical protraction to current program.  ? ?Encouraged pt to add biking/walking and LE strengthening into her routine due to stated difficulty with standing up from low surfaces and pt reporting MD predicts OA in lower extremities. ? ?Pain: pt reports worst pain over the past week as a 6/10 - described as achy and aggravating. ? ?MMT: ?LUE --  4-/5 for both mid trap and low trap ?RUE --  4/5 mid trap and 4-/5 low trap ? ?RUE ROM:  ?IR - T7 ?ER - T3 ?Shoulder flexion - 153 degrees,  ?Shoulder abduction - 155 degrees  ? ?FOTO: 56 ? ? ? ?  ?Clinical Impression: Pt arrives to session with knowledge of today's planned discharge. Extensive pt education was provided regarding imagining results, prognosis and treatment options. Objective measures were taken. Pt has experienced an overall increase in shoulder strength and ROM since evaluation however pain continues to be a limiting factor with max pain a 6/10. Encouraged pt to continue HEP after d/c for conservative maintenance. All questions have been addressed. Pt d/c this session with plan to try alternative treatment options.  ? ? ? ? ? ? ? PT Education - 11/08/21 1246   ? ? Education Details x-ray results, explained  OA along with prognosis and treatment options   ? Person(s) Educated Patient   ? Methods Explanation;Other (comment)   visual - images of spine via google  ? Comprehension Verbalized understanding   ? ?  ?  ? ?  ? ? ? PT Short Term Goals - 09/12/21 1022   ? ?  ? PT SHORT TERM GOAL #1  ? Title Pt will be independent with HEP in order to improve strength and decrease pain in order to improve pain-free function at home and work.   ? Baseline 07/20/21 HEP given; 2/27 continually progressing HEP with pt adherance   ? Time 4   ?  Period Weeks   ? Status Achieved   ? ?  ?  ? ?  ? ? ? ? PT Long Term Goals - 11/08/21 1249   ? ?  ? PT LONG TERM GOAL #1  ? Title Pt will decrease worst pain as reported on NPRS by at least 3 points in order to demonstrate clinically significant reduction in pain.   ? Baseline 08/20/21: 9/10, 09/12/21: 8/10; 09/30/21: 5/10 NPS   ? Time 8   ? Period Weeks   ? Status Achieved   ? Target Date 09/30/21   ?  ? PT LONG TERM GOAL #2  ? Title Pt will increase gross periscapular strength to at least 4/5 MMT grade bilat in order to demonstrate improvement in strength and function   ? Baseline 07/20/21 R/L T scap retractors 4-/4; Y lower trap 3+/5; 09/12/21: T scap retractors 4-/5, Y low trap 4-/5; 09/30/21: T scap retraction: 4/4-  Y low trap: 4/4; 11/07/21:  T scap retraction: 4/4-  Y low trap: 4-/4-   ? Time 8   ? Period Weeks   ? Status Not Met   ? Target Date 11/25/21   ?  ? PT LONG TERM GOAL #3  ? Title Pt will demonstrate full active shoulder ROM in order to complete household ADLs and self care ADLs   ? Baseline 07/20/21 flex 141d; abd 122d; ER C7; IR T 12; 09/12/21: F WFL, ABD 141, ER T2, IR T9; 09/30/21: flexion:143 deg, abduction: 168 deg,  ER: C2/occiput, IR: T7 on RUE, T4 on LUE; 11/07/21: IR T7,  ER T2,  flexion 153d, abduction 155d   ? Time 8   ? Period Weeks   ? Status Not Met   ? Target Date 11/25/21   ?  ? PT LONG TERM GOAL #4  ? Title Patient will increase FOTO score to 64 to demonstrate predicted  increase in functional mobility to complete ADLs   ? Baseline 07/20/21 54, 09/12/21: 59; 09/30/21: 59 with target of 64; 11/07/21: 56   ? Time 8   ? Period Weeks   ? Status Not Met   ? Target Date 11/25/21   ?  ? P

## 2021-11-10 ENCOUNTER — Encounter: Payer: Medicare HMO | Admitting: Physical Therapy

## 2021-11-15 ENCOUNTER — Encounter: Payer: Medicare HMO | Admitting: Physical Therapy

## 2021-11-17 ENCOUNTER — Encounter: Payer: Medicare HMO | Admitting: Physical Therapy

## 2021-11-21 ENCOUNTER — Encounter: Payer: Medicare HMO | Admitting: Physical Therapy

## 2021-11-23 ENCOUNTER — Encounter: Payer: Medicare HMO | Admitting: Physical Therapy

## 2021-11-24 ENCOUNTER — Other Ambulatory Visit: Payer: Self-pay | Admitting: Physician Assistant

## 2021-11-24 DIAGNOSIS — N951 Menopausal and female climacteric states: Secondary | ICD-10-CM

## 2021-11-25 NOTE — Telephone Encounter (Signed)
Requested medication (s) are due for refill today:   Yes ? ?Requested medication (s) are on the active medication list:   Yes ? ?Future visit scheduled:   Yes ? ? ?Last ordered: 10/06/2021  #10, 0 refills ? ?Returned because it's a non delegated refill  ? ?Requested Prescriptions  ?Pending Prescriptions Disp Refills  ? EST ESTROGENS-METHYLTEST HS 0.625-1.25 MG tablet [Pharmacy Med Name: EST ESTRGN METHTEST 0.625/1.'25MG'$  TB] 10 tablet   ?  Sig: TAKE 1 TABLET BY MOUTH DAILY AS DIRECTED  ?  ? Not Delegated - OB/GYN:  Hormone Combinations - Controlled Failed - 11/24/2021  3:35 PM  ?  ?  Failed - This refill cannot be delegated  ?  ?  Failed - Last BP in normal range  ?  BP Readings from Last 1 Encounters:  ?10/24/21 (!) 144/68  ?   ?  ?  Passed - Mammogram is up-to-date per Health Maintenance  ?  ?  Passed - Valid encounter within last 12 months  ?  Recent Outpatient Visits   ? ?      ? 1 month ago Hormone replacement therapy (HRT)  ? [place], PA-C  ? 3 months ago Acute COVID-19  ? [place], PA-C  ? 5 months ago Encounter for annual physical exam  ? [facility], Dionne Bucy, MD  ? 7 months ago Urinary symptom or sign  ? [facility] Gwyneth Sprout, FNP  ? 7 months ago Mild intermittent asthma with acute exacerbation  ? [facility], Henrine Screws T, NP  ? ?  ?  ?Future Appointments   ? ?        ? In 7 months  [facility], [facility]  ? In 7 months Drubel, Delman Cheadle [facility], [facility]  ? ?  ? ? ?  ?  ?  ? ?

## 2021-12-20 DIAGNOSIS — M5412 Radiculopathy, cervical region: Secondary | ICD-10-CM | POA: Diagnosis not present

## 2021-12-21 ENCOUNTER — Encounter: Payer: Self-pay | Admitting: Physician Assistant

## 2021-12-21 DIAGNOSIS — H259 Unspecified age-related cataract: Secondary | ICD-10-CM | POA: Diagnosis not present

## 2021-12-21 DIAGNOSIS — I739 Peripheral vascular disease, unspecified: Secondary | ICD-10-CM | POA: Diagnosis not present

## 2021-12-21 DIAGNOSIS — K219 Gastro-esophageal reflux disease without esophagitis: Secondary | ICD-10-CM | POA: Diagnosis not present

## 2021-12-21 DIAGNOSIS — J449 Chronic obstructive pulmonary disease, unspecified: Secondary | ICD-10-CM | POA: Diagnosis not present

## 2021-12-21 DIAGNOSIS — R32 Unspecified urinary incontinence: Secondary | ICD-10-CM | POA: Diagnosis not present

## 2021-12-21 DIAGNOSIS — R030 Elevated blood-pressure reading, without diagnosis of hypertension: Secondary | ICD-10-CM | POA: Diagnosis not present

## 2021-12-21 DIAGNOSIS — E669 Obesity, unspecified: Secondary | ICD-10-CM | POA: Diagnosis not present

## 2021-12-21 DIAGNOSIS — N390 Urinary tract infection, site not specified: Secondary | ICD-10-CM | POA: Diagnosis not present

## 2021-12-21 DIAGNOSIS — R69 Illness, unspecified: Secondary | ICD-10-CM | POA: Diagnosis not present

## 2021-12-21 DIAGNOSIS — Z008 Encounter for other general examination: Secondary | ICD-10-CM | POA: Diagnosis not present

## 2021-12-21 DIAGNOSIS — E039 Hypothyroidism, unspecified: Secondary | ICD-10-CM | POA: Diagnosis not present

## 2021-12-21 DIAGNOSIS — M1990 Unspecified osteoarthritis, unspecified site: Secondary | ICD-10-CM | POA: Diagnosis not present

## 2021-12-23 DIAGNOSIS — M5412 Radiculopathy, cervical region: Secondary | ICD-10-CM | POA: Diagnosis not present

## 2021-12-26 ENCOUNTER — Other Ambulatory Visit: Payer: Self-pay | Admitting: General Surgery

## 2021-12-26 DIAGNOSIS — R1900 Intra-abdominal and pelvic swelling, mass and lump, unspecified site: Secondary | ICD-10-CM

## 2021-12-26 DIAGNOSIS — K862 Cyst of pancreas: Secondary | ICD-10-CM

## 2022-01-03 DIAGNOSIS — Z1231 Encounter for screening mammogram for malignant neoplasm of breast: Secondary | ICD-10-CM | POA: Diagnosis not present

## 2022-01-03 LAB — HM MAMMOGRAPHY

## 2022-01-10 DIAGNOSIS — K862 Cyst of pancreas: Secondary | ICD-10-CM | POA: Diagnosis not present

## 2022-01-10 DIAGNOSIS — N6019 Diffuse cystic mastopathy of unspecified breast: Secondary | ICD-10-CM | POA: Diagnosis not present

## 2022-01-23 ENCOUNTER — Ambulatory Visit: Payer: Medicare HMO

## 2022-01-23 DIAGNOSIS — D2261 Melanocytic nevi of right upper limb, including shoulder: Secondary | ICD-10-CM | POA: Diagnosis not present

## 2022-01-23 DIAGNOSIS — L853 Xerosis cutis: Secondary | ICD-10-CM | POA: Diagnosis not present

## 2022-01-23 DIAGNOSIS — L538 Other specified erythematous conditions: Secondary | ICD-10-CM | POA: Diagnosis not present

## 2022-01-23 DIAGNOSIS — Z86007 Personal history of in-situ neoplasm of skin: Secondary | ICD-10-CM | POA: Diagnosis not present

## 2022-01-23 DIAGNOSIS — D2272 Melanocytic nevi of left lower limb, including hip: Secondary | ICD-10-CM | POA: Diagnosis not present

## 2022-01-23 DIAGNOSIS — L820 Inflamed seborrheic keratosis: Secondary | ICD-10-CM | POA: Diagnosis not present

## 2022-02-06 ENCOUNTER — Ambulatory Visit
Admission: RE | Admit: 2022-02-06 | Discharge: 2022-02-06 | Disposition: A | Discharge status: Home / Self Care | Payer: Medicare HMO | Source: Ambulatory Visit | Attending: General Surgery | Admitting: General Surgery

## 2022-02-06 DIAGNOSIS — K8020 Calculus of gallbladder without cholecystitis without obstruction: Secondary | ICD-10-CM | POA: Diagnosis not present

## 2022-02-06 DIAGNOSIS — K862 Cyst of pancreas: Secondary | ICD-10-CM | POA: Insufficient documentation

## 2022-02-06 DIAGNOSIS — K7689 Other specified diseases of liver: Secondary | ICD-10-CM | POA: Diagnosis not present

## 2022-02-06 MED ORDER — GADOBUTROL 1 MMOL/ML IV SOLN
8.0000 mL | Freq: Once | INTRAVENOUS | Status: AC | PRN
  Administered 2022-02-06: 8 mL via INTRAVENOUS

## 2022-02-09 ENCOUNTER — Encounter: Payer: Self-pay | Admitting: Physician Assistant

## 2022-02-14 ENCOUNTER — Other Ambulatory Visit: Payer: Self-pay | Admitting: Physician Assistant

## 2022-02-14 DIAGNOSIS — N951 Menopausal and female climacteric states: Secondary | ICD-10-CM

## 2022-02-15 NOTE — Telephone Encounter (Signed)
Pt called to f/u on a refill she is waiting for / EST ESTROGENS-METHYLTEST HS 0.625-1.25 MG tablet [Pharmacy Med Name: EST [facility] METHTEST 0.625/1.'25MG'$  TB] / please advise    [id number] Meredith Scott, [place] AT [place]  [address] [place], [place] [address]  Phone:  [phone]  Fax:  [phone]  DEA #:  [id number]

## 2022-02-15 NOTE — Telephone Encounter (Signed)
Requested medication (s) are due for refill today: yes  Requested medication (s) are on the active medication list: yes  Last refill:  11/25/21 # 10  Future visit scheduled: yes  Notes to clinic:  med not delegated to NT to refill   Requested Prescriptions  Pending Prescriptions Disp Refills   EST ESTROGENS-METHYLTEST HS 0.625-1.25 MG tablet [Pharmacy Med Name: EST ESTRGN METHTEST 0.625/1.'25MG'$  TB] 10 tablet     Sig: TAKE 1 TABLET BY MOUTH EVERY 6 DAYS     Not Delegated - OB/GYN:  Hormone Combinations - Controlled Failed - 02/15/2022 11:45 AM      Failed - This refill cannot be delegated      Failed - Mammogram is up-to-date per Health Maintenance      Failed - Last BP in normal range    BP Readings from Last 1 Encounters:  10/24/21 (!) 144/68         Passed - Valid encounter within last 12 months    Recent Outpatient Visits           3 months ago Hormone replacement therapy (HRT)   [facility] Thedore Mins, [place], PA-C   6 months ago Acute COVID-19   [employer], Dani Gobble, PA-C   8 months ago Encounter for annual physical exam   [employer] [facility], Dionne Bucy, MD   10 months ago Urinary symptom or sign   [facility] Tally Joe T, FNP   10 months ago Mild intermittent asthma with acute exacerbation   [facility] Venita Lick, NP       Future Appointments             In 4 months  [facility], [place]   In 4 months Drubel, Ria Comment, PA-C [employer], [place]

## 2022-02-15 NOTE — Telephone Encounter (Signed)
Requested medication (s) are due for refill today: yes  Requested medication (s) are on the active medication list: yes  Last refill:  11/25/21  Future visit scheduled: yes  Notes to clinic:  Unable to refill per protocol, cannot delegate.      Requested Prescriptions  Pending Prescriptions Disp Refills   EST ESTROGENS-METHYLTEST HS 0.625-1.25 MG tablet [Pharmacy Med Name: EST ESTRGN METHTEST 0.625/1.'25MG'$  TB] 10 tablet     Sig: TAKE 1 TABLET BY MOUTH EVERY 6 DAYS     Not Delegated - OB/GYN:  Hormone Combinations - Controlled Failed - 02/14/2022  7:59 AM      Failed - This refill cannot be delegated      Failed - Mammogram is up-to-date per Health Maintenance      Failed - Last BP in normal range    BP Readings from Last 1 Encounters:  10/24/21 (!) 144/68         Passed - Valid encounter within last 12 months    Recent Outpatient Visits           3 months ago Hormone replacement therapy (HRT)   [facility] Thedore Mins, [place], PA-C   6 months ago Acute COVID-19   [employer], Dani Gobble, PA-C   8 months ago Encounter for annual physical exam   [employer], Dionne Bucy, MD   10 months ago Urinary symptom or sign   [facility] Tally Joe T, FNP   10 months ago Mild intermittent asthma with acute exacerbation   [facility] Venita Lick, NP       Future Appointments             In 4 months  [employer], [place]   In 4 months Drubel, Ria Comment, PA-C [employer], [place]

## 2022-02-16 ENCOUNTER — Encounter: Payer: Self-pay | Admitting: Physician Assistant

## 2022-02-16 DIAGNOSIS — E039 Hypothyroidism, unspecified: Secondary | ICD-10-CM | POA: Diagnosis not present

## 2022-02-23 DIAGNOSIS — E039 Hypothyroidism, unspecified: Secondary | ICD-10-CM | POA: Diagnosis not present

## 2022-02-27 ENCOUNTER — Other Ambulatory Visit: Payer: Self-pay | Admitting: Physician Assistant

## 2022-02-27 DIAGNOSIS — F320 Major depressive disorder, single episode, mild: Secondary | ICD-10-CM

## 2022-03-07 ENCOUNTER — Encounter: Payer: Self-pay | Admitting: Physician Assistant

## 2022-03-28 ENCOUNTER — Ambulatory Visit (INDEPENDENT_AMBULATORY_CARE_PROVIDER_SITE_OTHER): Payer: Medicare HMO

## 2022-03-28 DIAGNOSIS — Z23 Encounter for immunization: Secondary | ICD-10-CM

## 2022-06-26 NOTE — Progress Notes (Unsigned)
I,Meredith Scott,acting as a Education administrator for [employer], PA-C.,have documented all relevant documentation on the behalf of Meredith Kirschner, PA-C,as directed by  Meredith Kirschner, PA-C while in the presence of Meredith Kirschner, PA-C.  Complete physical exam   Patient: Meredith Scott   DOB: 06-24-1946   76 y.o. Female  MRN: [id number] Visit Date: 06/27/2022  Today's healthcare provider: [name], PA-C   Cc. cpe  Subjective    Meredith Scott is a 76 y.o. female who presents today for a complete physical exam.  She reports consuming a general diet. The patient does not participate in regular exercise at present. She generally feels well. She reports sleeping well. She does have additional problems to discuss today.  HPI   .Pt complains of Right leg edema. Pt complains of poor circulation. ---She reports sometimes at the end of the day noticing some swelling in her right leg. It will usually go away. Concentrated more around the knee than the ankle. Denies numbness, pain.    Pt also complains of a chronic cough x 1 year. Cough is aggravated by weather changes. She describes it as a choked up feeling-- like something goes down the wrong pipe. Will happen w/ liquids, not solids. Will sometimes happen without eating.  Past Medical History:  Diagnosis Date   Anxiety    Asthma    Asymptomatic varicose veins 2011   Breast screening, unspecified 2013   Colon polyp    Diffuse cystic mastopathy 2013   Hernia of abdominal wall    History of hiatal hernia    Hypertension    Hypothyroidism    Dr Eddie Dibbles   Obesity, unspecified 2013   Osteoarthritis    PONV (postoperative nausea and vomiting)    Screening for obesity 2013   Skin cancer of face 2018   squamous/ left cheek   Special screening for malignant neoplasms, colon 2013   Past Surgical History:  Procedure Laterality Date   ABDOMINAL HYSTERECTOMY  1994   BREAST CYST ASPIRATION Right 1997?   breast aspiration d/t infected  milk gland   BREAST MASS EXCISION Left 2011   COLONOSCOPY  2005   done by Dr. Jamal Collin   COLONOSCOPY N/A 01/27/2015   Procedure: COLONOSCOPY;  Surgeon: Christene Lye, MD;  Location: [facility] ENDOSCOPY;  Service: Endoscopy;  Laterality: N/A;   COLONOSCOPY WITH PROPOFOL N/A 02/13/2020   Procedure: COLONOSCOPY WITH PROPOFOL;  Surgeon: Robert Bellow, MD;  Location: [facility] ENDOSCOPY;  Service: Endoscopy;  Laterality: N/A;   FINE NEEDLE ASPIRATION     FOOT SURGERY Right    LIPOMA EXCISION  2010   breast bone   SKIN CANCER EXCISION Left 12/2016   under left eye, [facility]   TONGUE BIOPSY     Social History   Socioeconomic History   Marital status: Married    Spouse name: Not on file   Number of children: 1   Years of education: H/S   Highest education level: High school graduate  Occupational History   Occupation: Retired  Tobacco Use   Smoking status: Never   Smokeless tobacco: Never  Vaping Use   Vaping Use: Never used  Substance and Sexual Activity   Alcohol use: No   Drug use: No   Sexual activity: Not Currently  Other Topics Concern   Not on file  Social History Narrative   Not on file   Social Determinants of Health   Financial Resource Strain: Low Risk  (06/27/2022)  Overall Financial Resource Strain (CARDIA)    Difficulty of Paying Living Expenses: Not hard at all  Food Insecurity: No Food Insecurity (06/27/2022)   Hunger Vital Sign    Worried About Running Out of Food in the Last Year: Never true    Ran Out of Food in the Last Year: Never true  Transportation Needs: No Transportation Needs (06/27/2022)   PRAPARE - Hydrologist (Medical): No    Lack of Transportation (Non-Medical): No  Physical Activity: Insufficiently Active (06/27/2022)   Exercise Vital Sign    Days of Exercise per Week: 3 days    Minutes of Exercise per Session: 30 min  Stress: No Stress Concern Present (06/27/2022)   [place]    Feeling of Stress : Only a little  Social Connections: Moderately Integrated (06/27/2022)   Social Connection and Isolation Panel [NHANES]    Frequency of Communication with Friends and Family: More than three times a week    Frequency of Social Gatherings with Friends and Family: Twice a week    Attends Religious Services: More than 4 times per year    Active Member of [employer] or Organizations: No    Attends Archivist Meetings: Never    Marital Status: Married  Human resources officer Violence: Not At Risk (06/27/2022)   Humiliation, Afraid, Rape, and Kick questionnaire    Fear of Current or Ex-Partner: No    Emotionally Abused: No    Physically Abused: No    Sexually Abused: No   Family Status  Relation Name Status   Mother  Deceased at age 81   Father  Deceased   Sister  Alive   Brother  Alive   Daughter  Alive       Had a kidney aneursym 8/20   Administrator  (Not Specified)   G Daughter  Alive   G Daughter  Alive   Other Paternal Cousin Alive   Family History  Problem Relation Age of Onset   Liver cancer Mother    Thyroid disease Mother    Diabetes Father    Healthy Sister    Dementia Sister    Healthy Brother    Lung cancer Maternal Uncle    Arthritis Granddaughter    Lupus Granddaughter    Arthritis Granddaughter    Lupus Granddaughter    Breast cancer Other    Allergies  Allergen Reactions   Shellfish Allergy Other (See Comments)    Throat closes   Sulfa Antibiotics Swelling   Neosporin [Neomycin-Bacitracin Zn-Polymyx] Rash    Patient Care Team: Meredith Kirschner, PA-C as PCP - General (Physician Assistant) Bary Castilla, Forest Gleason, MD (General Surgery) Warnell Forester, NP (Inactive) as Nurse Practitioner (Surgery) Hester Mates, OD as Referring Physician (Optometry) Pa, [facility] (Dermatology)   Medications: Outpatient Medications Prior to Visit  Medication Sig   ascorbic acid (VITAMIN  C) 500 MG tablet Take by mouth.    aspirin 81 MG tablet Take 81 mg by mouth daily.   calcium-vitamin D (OSCAL WITH D) 500-200 MG-UNIT tablet Take 1 tablet by mouth daily with breakfast.    celecoxib (CELEBREX) 100 MG capsule Take 100 mg by mouth 2 (two) times daily. PRN   Cholecalciferol (VITAMIN D3) 50 MCG (2000 UT) CAPS Take 1 capsule by mouth daily.   escitalopram (LEXAPRO) 10 MG tablet TAKE 1/2 TABLET(5 MG) BY MOUTH DAILY   Fish Oil OIL by Does not apply route daily.  Ipratropium-Albuterol (COMBIVENT RESPIMAT) 20-100 MCG/ACT AERS respimat Inhale 1 puff into the lungs every 6 (six) hours as needed for wheezing.   levothyroxine (SYNTHROID, LEVOTHROID) 25 MCG tablet Take 25 mcg by mouth daily before breakfast. 2 on Sunday and Thursday   melatonin 3 MG TABS tablet Take 3 mg by mouth at bedtime.   PFIZER-BIONT COVID-19 VAC-TRIS SUSP injection    SYMBICORT 160-4.5 MCG/ACT inhaler INHALE 2 PUFFS INTO THE LUNGS TWICE DAILY   Wheat Dextrin (BENEFIBER PO) Take by mouth daily. Or as EOD   zafirlukast (ACCOLATE) 20 MG tablet Take 1 tablet (20 mg total) by mouth 2 (two) times daily before a meal. (Patient taking differently: Take 20 mg by mouth 2 (two) times daily before a meal. As needed)   [DISCONTINUED] estrogen-methylTESTOSTERone (EST ESTROGENS-METHYLTEST HS) 0.625-1.25 MG tablet TAKE 1 TABLET BY MOUTH EVERY 6 DAYS   No facility-administered medications prior to visit.    Review of Systems  Constitutional:  Negative for fatigue and fever.  Respiratory:  Negative for cough and shortness of breath.   Cardiovascular:  Negative for chest pain and leg swelling.  Gastrointestinal:  Negative for abdominal pain.  Neurological:  Negative for dizziness and headaches.     Objective    Blood pressure 120/70, pulse 61, temperature 97.7 F (36.5 C), temperature source Oral, height '5\' 3"'$  (1.6 m), weight 186 lb 8 oz (84.6 kg), SpO2 100 %.    Physical Exam Constitutional:      General: She is awake.      Appearance: She is well-developed. She is not ill-appearing.  HENT:     Head: Normocephalic.     Right Ear: Tympanic membrane normal.     Left Ear: Tympanic membrane normal.     Nose: Nose normal. No congestion or rhinorrhea.     Mouth/Throat:     Pharynx: No oropharyngeal exudate or posterior oropharyngeal erythema.  Eyes:     Conjunctiva/sclera: Conjunctivae normal.     Pupils: Pupils are equal, round, and reactive to light.  Neck:     Thyroid: No thyroid mass or thyromegaly.  Cardiovascular:     Rate and Rhythm: Normal rate and regular rhythm.     Heart sounds: Normal heart sounds.  Pulmonary:     Effort: Pulmonary effort is normal.     Breath sounds: Normal breath sounds.  Abdominal:     Palpations: Abdomen is soft.     Tenderness: There is no abdominal tenderness.  Musculoskeletal:     Right lower leg: No swelling. No edema.     Left lower leg: No swelling. No edema.  Lymphadenopathy:     Cervical: No cervical adenopathy.  Skin:    General: Skin is warm.     Comments: Some hyperpigmentation lower extremities without skin breakdown  Neurological:     Mental Status: She is alert and oriented to person, place, and time.  Psychiatric:        Attention and Perception: Attention normal.        Mood and Affect: Mood normal.        Speech: Speech normal.        Behavior: Behavior normal. Behavior is cooperative.     Last depression screening scores    06/27/2022    8:21 AM 10/24/2021    9:50 AM 08/16/2021    2:21 PM  PHQ 2/9 Scores  PHQ - 2 Score 1 0 0  PHQ- 9 Score 3 0    Last fall risk screening  06/27/2022    8:24 AM  Fall Risk   Falls in the past year? 0  Number falls in past yr: 0  Injury with Fall? 0  Risk for fall due to : No Fall Risks  Follow up Falls prevention discussed;Falls evaluation completed   Last Audit-C alcohol use screening    06/27/2022    8:20 AM  Alcohol Use Disorder Test (AUDIT)  1. How often do you have a drink containing  alcohol? 0  2. How many drinks containing alcohol do you have on a typical day when you are drinking? 0  3. How often do you have six or more drinks on one occasion? 0  AUDIT-C Score 0   A score of 3 or more in women, and 4 or more in men indicates increased risk for alcohol abuse, EXCEPT if all of the points are from question 1   No results found for any visits on 06/27/22.  Assessment & Plan    Routine Health Maintenance and Physical Exam  Exercise Activities and Dietary recommendations --balanced diet high in fiber and protein, low in sugars, carbs, fats. --physical activity/exercise 30 minutes 3-5 times a week    Immunization History  Administered Date(s) Administered   Fluad Quad(high Dose 65+) 04/08/2019, 04/14/2020, 04/21/2021, 03/28/2022   Influenza, High Dose Seasonal PF 04/17/2014, 04/26/2015, 04/22/2016, 04/19/2017, 05/04/2018   PFIZER(Purple Top)SARS-COV-2 Vaccination 09/01/2019, 09/22/2019, 06/03/2020, 02/02/2021   Pfizer Covid-19 Vaccine Bivalent Booster 77yr & up 05/18/2021   Pneumococcal Conjugate-13 05/22/2014   Pneumococcal Polysaccharide-23 05/20/2012   Td 03/14/2004   Tdap 04/21/2011   Zoster Recombinat (Shingrix) 10/25/2017, 01/30/2018   Zoster, Live 12/02/2012    Health Maintenance  Topic Date Due   DTaP/Tdap/Td (3 - Td or Tdap) 04/20/2021   MAMMOGRAM  12/28/2021   COVID-19 Vaccine (6 - 2023-24 season) 03/17/2022   Medicare Annual Wellness (AWV)  06/28/2023   COLONOSCOPY (Pts 45-461yrInsurance coverage will need to be confirmed)  02/12/2025   DEXA SCAN  08/19/2025   Pneumonia Vaccine 6548Years old  Completed   INFLUENZA VACCINE  Completed   Hepatitis C Screening  Completed   Zoster Vaccines- Shingrix  Completed   HPV VACCINES  Aged Out    Discussed health benefits of physical activity, and encouraged her to engage in regular exercise appropriate for her age and condition.  Problem List Items Addressed This Visit       Digestive   Dysphagia     True dysphagia vs GERD Advised trying pepcid or omeprazole OTC for relief of symptoms Pt follows with ENT, hx of unspecified throat surgery years ago -- encouraged her to discuss with this provider  Can always refer to GI for swallowing study         Endocrine   Acquired hypothyroidism    Repeat tsh/t4 F/b endocrinology      Relevant Orders   CBC w/Diff/Platelet   TSH + free T4     Other   Elevated LDL cholesterol level    Repeat fasting lipids, cmp  Not on a statin  Takes fish oil and asa 81 mg The 10-year ASCVD risk score (Arnett DK, et al., 2019) is: 14.3%       Relevant Orders   Comprehensive Metabolic Panel (CMET)   Lipid Profile   Hormone replacement therapy (HRT)    Currently takes estrogen/methyltestosterone 10 mg once every 6-7 days.  Refilled. Continue with annual mammograms Breast exam today normal      Relevant Medications   estrogen-methylTESTOSTERone (  EST ESTROGENS-METHYLTEST HS) 0.625-1.25 MG tablet   Prediabetes    Historically will repeat a1c      Relevant Orders   CBC w/Diff/Platelet   HgB A1c   Avitaminosis D    Pt manages with 2,000 IU otc      Relevant Orders   Vitamin D (25 hydroxy)   Other Visit Diagnoses     Encounter for annual physical exam    -  Primary        Return in about 6 months (around 12/27/2022) for chronic conditions, anxiety.     I, Meredith Kirschner, PA-C have reviewed all documentation for this visit. The documentation on  06/27/2022  for the exam, diagnosis, procedures, and orders are all accurate and complete.  Meredith Kirschner, PA-C [facility] [address] [place], [place], [address] Office: [phone] Fax: [place]

## 2022-06-27 ENCOUNTER — Ambulatory Visit (INDEPENDENT_AMBULATORY_CARE_PROVIDER_SITE_OTHER): Payer: Medicare HMO | Admitting: Physician Assistant

## 2022-06-27 ENCOUNTER — Encounter: Payer: Self-pay | Admitting: Physician Assistant

## 2022-06-27 ENCOUNTER — Ambulatory Visit (INDEPENDENT_AMBULATORY_CARE_PROVIDER_SITE_OTHER): Payer: Medicare HMO

## 2022-06-27 VITALS — BP 128/70 | Ht 62.5 in | Wt 186.6 lb

## 2022-06-27 VITALS — BP 120/70 | HR 61 | Temp 97.7°F | Ht 63.0 in | Wt 186.5 lb

## 2022-06-27 DIAGNOSIS — R1310 Dysphagia, unspecified: Secondary | ICD-10-CM

## 2022-06-27 DIAGNOSIS — R7303 Prediabetes: Secondary | ICD-10-CM | POA: Diagnosis not present

## 2022-06-27 DIAGNOSIS — E559 Vitamin D deficiency, unspecified: Secondary | ICD-10-CM | POA: Diagnosis not present

## 2022-06-27 DIAGNOSIS — Z0000 Encounter for general adult medical examination without abnormal findings: Secondary | ICD-10-CM | POA: Diagnosis not present

## 2022-06-27 DIAGNOSIS — E039 Hypothyroidism, unspecified: Secondary | ICD-10-CM | POA: Diagnosis not present

## 2022-06-27 DIAGNOSIS — Z79890 Hormone replacement therapy (postmenopausal): Secondary | ICD-10-CM | POA: Diagnosis not present

## 2022-06-27 DIAGNOSIS — E7800 Pure hypercholesterolemia, unspecified: Secondary | ICD-10-CM | POA: Diagnosis not present

## 2022-06-27 MED ORDER — EST ESTROGENS-METHYLTEST HS 0.625-1.25 MG PO TABS: ORAL_TABLET | ORAL | 2 refills | Status: DC

## 2022-06-27 NOTE — Assessment & Plan Note (Signed)
Currently takes estrogen/methyltestosterone 10 mg once every 6-7 days.  Refilled. Continue with annual mammograms Breast exam today normal

## 2022-06-27 NOTE — Patient Instructions (Signed)
Pepcid famotidine '20mg'$  once to twice daily

## 2022-06-27 NOTE — Assessment & Plan Note (Signed)
Historically will repeat a1c 

## 2022-06-27 NOTE — Assessment & Plan Note (Signed)
Pt manages with 2,000 IU otc

## 2022-06-27 NOTE — Assessment & Plan Note (Signed)
Repeat fasting lipids, cmp  Not on a statin  Takes fish oil and asa 81 mg The 10-year ASCVD risk score (Arnett DK, et al., 2019) is: 14.3%

## 2022-06-27 NOTE — Progress Notes (Signed)
Subjective:   Meredith Scott is a 76 y.o. female who presents for Medicare Annual (Subsequent) preventive examination.  Review of Systems     Cardiac Risk Factors include: advanced age (>53mn, >>43women)     Objective:    Today's Vitals   06/27/22 0812  BP: 128/70  Weight: 186 lb 9.6 oz (84.6 kg)  Height: 5' 2.5" (1.588 m)   Body mass index is 33.59 kg/m.     06/27/2022    8:23 AM 07/20/2021    9:20 AM 06/01/2021    9:47 AM 05/26/2020    9:44 AM 02/13/2020    7:12 AM 05/19/2019    8:50 AM 05/15/2018    8:53 AM  Advanced Directives  Does Patient Have a Medical Advance Directive? Yes No Yes Yes Yes Yes Yes  Type of AParamedicof [place]etteringLiving will  Healthcare Power of AMasonLiving will HBetweenLiving will HBerlinLiving will HCoxtonLiving will  Does patient want to make changes to medical advance directive? No - Patient declined  No - Patient declined      Copy of HF[place] Chart? Yes - validated most recent copy scanned in chart (See row information)  No - copy requested No - copy requested No - copy requested No - copy requested No - copy requested  Would patient like information on creating a medical advance directive?  No - Patient declined         Current Medications (verified) Outpatient Encounter Medications as of 06/27/2022  Medication Sig   ascorbic acid (VITAMIN C) 500 MG tablet Take by mouth.    aspirin 81 MG tablet Take 81 mg by mouth daily.   calcium-vitamin D (OSCAL WITH D) 500-200 MG-UNIT tablet Take 1 tablet by mouth daily with breakfast.    celecoxib (CELEBREX) 100 MG capsule Take 100 mg by mouth 2 (two) times daily. PRN   Cholecalciferol (VITAMIN D3) 50 MCG (2000 UT) CAPS Take 1 capsule by mouth daily.   escitalopram (LEXAPRO) 10 MG tablet TAKE 1/2 TABLET(5 MG) BY MOUTH DAILY   estrogen-methylTESTOSTERone (EST  ESTROGENS-METHYLTEST HS) 0.625-1.25 MG tablet TAKE 1 TABLET BY MOUTH EVERY 6 DAYS   Fish Oil OIL by Does not apply route daily.    Ipratropium-Albuterol (COMBIVENT RESPIMAT) 20-100 MCG/ACT AERS respimat Inhale 1 puff into the lungs every 6 (six) hours as needed for wheezing.   levothyroxine (SYNTHROID, LEVOTHROID) 25 MCG tablet Take 25 mcg by mouth daily before breakfast. 2 on Sunday and Thursday   melatonin 3 MG TABS tablet Take 3 mg by mouth at bedtime.   PFIZER-BIONT COVID-19 VAC-TRIS SUSP injection    SYMBICORT 160-4.5 MCG/ACT inhaler INHALE 2 PUFFS INTO THE LUNGS TWICE DAILY   Wheat Dextrin (BENEFIBER PO) Take by mouth daily. Or as EOD   zafirlukast (ACCOLATE) 20 MG tablet Take 1 tablet (20 mg total) by mouth 2 (two) times daily before a meal. (Patient taking differently: Take 20 mg by mouth 2 (two) times daily before a meal. As needed)   [DISCONTINUED] Ascorbic Acid (VITAMIN C PO) Take 500 mg by mouth daily.    [DISCONTINUED] WHEAT DEXTRIN PO Take by mouth.   No facility-administered encounter medications on file as of 06/27/2022.    Allergies (verified) Shellfish allergy, Sulfa antibiotics, and Neosporin [neomycin-bacitracin zn-polymyx]   History: Past Medical History:  Diagnosis Date   Anxiety    Asthma    Asymptomatic varicose veins  2011   Breast screening, unspecified 2013   Colon polyp    Diffuse cystic mastopathy 2013   Hernia of abdominal wall    History of hiatal hernia    Hypertension    Hypothyroidism    Dr Eddie Dibbles   Obesity, unspecified 2013   Osteoarthritis    PONV (postoperative nausea and vomiting)    Screening for obesity 2013   Skin cancer of face 2018   squamous/ left cheek   Special screening for malignant neoplasms, colon 2013   Past Surgical History:  Procedure Laterality Date   ABDOMINAL HYSTERECTOMY  1994   BREAST CYST ASPIRATION Right 1997?   breast aspiration d/t infected milk gland   BREAST MASS EXCISION Left 2011   COLONOSCOPY  2005   done  by Dr. Jamal Collin   COLONOSCOPY N/A 01/27/2015   Procedure: COLONOSCOPY;  Surgeon: Christene Lye, MD;  Location: [facility] ENDOSCOPY;  Service: Endoscopy;  Laterality: N/A;   COLONOSCOPY WITH PROPOFOL N/A 02/13/2020   Procedure: COLONOSCOPY WITH PROPOFOL;  Surgeon: Robert Bellow, MD;  Location: [facility] ENDOSCOPY;  Service: Endoscopy;  Laterality: N/A;   FINE NEEDLE ASPIRATION     FOOT SURGERY Right    LIPOMA EXCISION  2010   breast bone   SKIN CANCER EXCISION Left 12/2016   under left eye, [facility]   TONGUE BIOPSY     Family History  Problem Relation Age of Onset   Liver cancer Mother    Thyroid disease Mother    Diabetes Father    Healthy Sister    Dementia Sister    Healthy Brother    Lung cancer Maternal Uncle    Arthritis Granddaughter    Lupus Granddaughter    Arthritis Granddaughter    Lupus Granddaughter    Breast cancer Other    Social History   Socioeconomic History   Marital status: Married    Spouse name: Not on file   Number of children: 1   Years of education: H/S   Highest education level: High school graduate  Occupational History   Occupation: Retired  Tobacco Use   Smoking status: Never   Smokeless tobacco: Never  Vaping Use   Vaping Use: Never used  Substance and Sexual Activity   Alcohol use: No   Drug use: No   Sexual activity: Not Currently  Other Topics Concern   Not on file  Social History Narrative   Not on file   Social Determinants of Health   Financial Resource Strain: Low Risk  (06/27/2022)   Overall Financial Resource Strain (CARDIA)    Difficulty of Paying Living Expenses: Not hard at all  Food Insecurity: No Food Insecurity (06/27/2022)   Hunger Vital Sign    Worried About Running Out of Food in the Last Year: Never true    [place] in the Last Year: Never true  Transportation Needs: No Transportation Needs (06/27/2022)   PRAPARE - Hydrologist (Medical): No    Lack of  Transportation (Non-Medical): No  Physical Activity: Insufficiently Active (06/27/2022)   Exercise Vital Sign    Days of Exercise per Week: 3 days    Minutes of Exercise per Session: 30 min  Stress: No Stress Concern Present (06/27/2022)   [place]    Feeling of Stress : Only a little  Social Connections: Moderately Integrated (06/27/2022)   Social Connection and Isolation Panel [NHANES]    Frequency of Communication  with Friends and Family: More than three times a week    Frequency of Social Gatherings with Friends and Family: Twice a week    Attends Religious Services: More than 4 times per year    Active Member of [employer] or Organizations: No    Attends Music therapist: Never    Marital Status: Married    Tobacco Counseling Counseling given: Not Answered   Clinical Intake:  Pre-visit preparation completed: Yes  Pain : No/denies pain     Nutritional Risks: None Diabetes: No  How often do you need to have someone help you when you read instructions, pamphlets, or other written materials from your doctor or pharmacy?: 1 - Never  Diabetic?no  Interpreter Needed?: No  Information entered by :: Kirke Shaggy, LPN   Activities of Daily Living    06/27/2022    8:24 AM 10/24/2021    9:50 AM  In your present state of health, do you have any difficulty performing the following activities:  Hearing? 0 0  Vision? 0 0  Difficulty concentrating or making decisions? 0 0  Walking or climbing stairs? 1   Comment  some'  Dressing or bathing? 0 0  Doing errands, shopping? 0 0  Preparing Food and eating ? N   Using the Toilet? N   In the past six months, have you accidently leaked urine? N   Do you have problems with loss of bowel control? N   Managing your Medications? N   Managing your Finances? N   Housekeeping or managing your Housekeeping? N     Patient Care Team: [name], PA-C  as PCP - General (Physician Assistant) Bary Castilla Forest Gleason, MD (General Surgery) Warnell Forester, NP (Inactive) as Nurse Practitioner (Surgery) Hester Mates, OD as Referring Physician (Optometry) Pa, [facility] (Dermatology)  Indicate any recent Medical Services you may have received from other than [facility] providers in the past year (date may be approximate).     Assessment:   This is a routine wellness examination for Tobi.  Hearing/Vision screen Hearing Screening - Comments:: No aids Vision Screening - Comments:: Wears glasses- [facility]  Dietary issues and exercise activities discussed: Current Exercise Habits: Home exercise routine, Type of exercise: walking, Time (Minutes): 30, Frequency (Times/Week): 3, Weekly Exercise (Minutes/Week): 90   Goals Addressed             This Visit's Progress    DIET - EAT MORE FRUITS AND VEGETABLES         Depression Screen    06/27/2022    8:21 AM 10/24/2021    9:50 AM 08/16/2021    2:21 PM 06/01/2021    9:45 AM 04/21/2021   10:06 AM 08/20/2020    3:18 PM 05/26/2020    9:41 AM  PHQ 2/9 Scores  PHQ - 2 Score 1 0 0 0 0 1 0  PHQ- 9 Score 3 0  0 1 2     Fall Risk    06/27/2022    8:24 AM 10/24/2021    9:50 AM 08/16/2021    2:21 PM 06/01/2021    9:49 AM 08/20/2020    3:15 PM  Fall Risk   Falls in the past year? 0 0 0 0 0  Number falls in past yr: 0 0 0 0 0  Injury with Fall? 0 0 0 0 0  Risk for fall due to : No Fall Risks No Fall Risks  No Fall Risks   Follow up Falls prevention  discussed;Falls evaluation completed   Falls prevention discussed Falls evaluation completed    FALL RISK PREVENTION PERTAINING TO THE HOME:  Any stairs in or around the home? No  If so, are there any without handrails? No  Home free of loose throw rugs in walkways, pet beds, electrical cords, etc? Yes  Adequate lighting in your home to reduce risk of falls? Yes   ASSISTIVE DEVICES UTILIZED TO PREVENT FALLS:  Life alert? No  Use of a  cane, walker or w/c? No  Grab bars in the bathroom? No  Shower chair or bench in shower? Yes  Elevated toilet seat or a handicapped toilet? Yes   TIMED UP AND GO:  Was the test performed? Yes .  Length of time to ambulate 10 feet: 4 sec.   Gait steady and fast without use of assistive device  Cognitive Function:        06/27/2022    8:32 AM 05/26/2020    9:50 AM 05/19/2019    8:58 AM 05/15/2018    9:00 AM  6CIT Screen  What Year? 0 points 0 points 0 points 0 points  What month?  0 points 0 points 0 points  What time? 0 points 0 points 0 points 0 points  Count back from 20 0 points 0 points 0 points 0 points  Months in reverse 0 points 0 points 0 points 0 points  Repeat phrase 4 points 0 points 0 points 2 points  Total Score  0 points 0 points 2 points    Immunizations Immunization History  Administered Date(s) Administered   Fluad Quad(high Dose 65+) 04/08/2019, 04/14/2020, 04/21/2021, 03/28/2022   Influenza, High Dose Seasonal PF 04/17/2014, 04/26/2015, 04/22/2016, 04/19/2017, 05/04/2018   PFIZER(Purple Top)SARS-COV-2 Vaccination 09/01/2019, 09/22/2019, 06/03/2020, 02/02/2021   Pfizer Covid-19 Vaccine Bivalent Booster 27yr & up 05/18/2021   Pneumococcal Conjugate-13 05/22/2014   Pneumococcal Polysaccharide-23 05/20/2012   Td 03/14/2004   Tdap 04/21/2011   Zoster Recombinat (Shingrix) 10/25/2017, 01/30/2018   Zoster, Live 12/02/2012    TDAP status: Up to date  Flu Vaccine status: Up to date  Pneumococcal vaccine status: Up to date  Covid-19 vaccine status: Completed vaccines  Qualifies for Shingles Vaccine? Yes   Zostavax completed Yes   Shingrix Completed?: Yes  Screening Tests Health Maintenance  Topic Date Due   DTaP/Tdap/Td (3 - Td or Tdap) 04/20/2021   MAMMOGRAM  12/28/2021   COVID-19 Vaccine (6 - 2023-24 season) 03/17/2022   Medicare Annual Wellness (AWV)  06/28/2023   COLONOSCOPY (Pts 45-462yrInsurance coverage will need to be confirmed)   02/12/2025   DEXA SCAN  08/19/2025   Pneumonia Vaccine 6536Years old  Completed   INFLUENZA VACCINE  Completed   Hepatitis C Screening  Completed   Zoster Vaccines- Shingrix  Completed   HPV VACCINES  Aged Out    Health Maintenance  Health Maintenance Due  Topic Date Due   DTaP/Tdap/Td (3 - Td or Tdap) 04/20/2021   MAMMOGRAM  12/28/2021   COVID-19 Vaccine (6 - 2023-24 season) 03/17/2022    Colorectal cancer screening: Type of screening: Colonoscopy. Completed 02/13/20. Repeat every 5 years  Mammogram status: Completed had one in June 2023. Repeat every year  Bone Density status: Completed 08/19/20. Results reflect: Bone density results: OSTEOPENIA. Repeat every 5 years.  Lung Cancer Screening: (Low Dose CT Chest recommended if Age 76-80ears, 30 pack-year currently smoking OR have quit w/in 15years.) does not qualify.   Additional Screening:  Hepatitis C Screening: does qualify; Completed  06/01/17  Vision Screening: Recommended annual ophthalmology exams for early detection of glaucoma and other disorders of the eye. Is the patient up to date with their annual eye exam?  Yes  Who is the provider or what is the name of the office in which the patient attends annual eye exams? [facility] If pt is not established with a provider, would they like to be referred to a provider to establish care? No .   Dental Screening: Recommended annual dental exams for proper oral hygiene  Community Resource Referral / Chronic Care Management: CRR required this visit?  No   CCM required this visit?  No      Plan:     I have personally reviewed and noted the following in the patient's chart:   Medical and social history Use of alcohol, tobacco or illicit drugs  Current medications and supplements including opioid prescriptions. Patient is not currently taking opioid prescriptions. Functional ability and status Nutritional status Physical activity Advanced directives List of other  physicians Hospitalizations, surgeries, and ER visits in previous 12 months Vitals Screenings to include cognitive, depression, and falls Referrals and appointments  In addition, I have reviewed and discussed with patient certain preventive protocols, quality metrics, and best practice recommendations. A written personalized care plan for preventive services as well as general preventive health recommendations were provided to patient.     Dionisio David, LPN   80/32/1224   Nurse Notes: none

## 2022-06-27 NOTE — Assessment & Plan Note (Signed)
True dysphagia vs GERD Advised trying pepcid or omeprazole OTC for relief of symptoms Pt follows with ENT, hx of unspecified throat surgery years ago -- encouraged her to discuss with this provider  Can always refer to GI for swallowing study

## 2022-06-27 NOTE — Patient Instructions (Signed)
Meredith Scott , Thank you for taking time to come for your Medicare Wellness Visit. I appreciate your ongoing commitment to your health goals. Please review the following plan we discussed and let me know if I can assist you in the future.   Screening recommendations/referrals: Colonoscopy: 02/13/20 Mammogram: June 2023 per pt Bone Density: 08/19/20 Recommended yearly ophthalmology/optometry visit for glaucoma screening and checkup Recommended yearly dental visit for hygiene and checkup  Vaccinations: Influenza vaccine: 03/28/22 Pneumococcal vaccine: 05/22/14 Tdap vaccine: received this year per pt Shingles vaccine: Zostavax 12/02/12   Shingrix 10/25/17, 01/30/18   Covid-19:09/01/19, 09/22/19, 06/03/20, 02/02/21, 05/18/21, received in February 03, 2022  Advanced directives: no  Conditions/risks identified: none  Next appointment: Follow up in one year for your annual wellness visit 07/02/23 @ 9 am in person   Preventive Care 65 Years and Older, Female Preventive care refers to lifestyle choices and visits with your health care provider that can promote health and wellness. What does preventive care include? A yearly physical exam. This is also called an annual well check. Dental exams once or twice a year. Routine eye exams. Ask your health care provider how often you should have your eyes checked. Personal lifestyle choices, including: Daily care of your teeth and gums. Regular physical activity. Eating a healthy diet. Avoiding tobacco and drug use. Limiting alcohol use. Practicing safe sex. Taking low-dose aspirin every day. Taking vitamin and mineral supplements as recommended by your health care provider. What happens during an annual well check? The services and screenings done by your health care provider during your annual well check will depend on your age, overall health, lifestyle risk factors, and family history of disease. Counseling  Your health care provider may ask you  questions about your: Alcohol use. Tobacco use. Drug use. Emotional well-being. Home and relationship well-being. Sexual activity. Eating habits. History of falls. Memory and ability to understand (cognition). Work and work Statistician. Reproductive health. Screening  You may have the following tests or measurements: Height, weight, and BMI. Blood pressure. Lipid and cholesterol levels. These may be checked every 5 years, or more frequently if you are over 4 years old. Skin check. Lung cancer screening. You may have this screening every year starting at age 9 if you have a 30-pack-year history of smoking and currently smoke or have quit within the past 15 years. Fecal occult blood test (FOBT) of the stool. You may have this test every year starting at age 1. Flexible sigmoidoscopy or colonoscopy. You may have a sigmoidoscopy every 5 years or a colonoscopy every 10 years starting at age 57. Hepatitis C blood test. Hepatitis B blood test. Sexually transmitted disease (STD) testing. Diabetes screening. This is done by checking your blood sugar (glucose) after you have not eaten for a while (fasting). You may have this done every 1-3 years. Bone density scan. This is done to screen for osteoporosis. You may have this done starting at age 57. Mammogram. This may be done every 1-2 years. Talk to your health care provider about how often you should have regular mammograms. Talk with your health care provider about your test results, treatment options, and if necessary, the need for more tests. Vaccines  Your health care provider may recommend certain vaccines, such as: Influenza vaccine. This is recommended every year. Tetanus, diphtheria, and acellular pertussis (Tdap, Td) vaccine. You may need a Td booster every 10 years. Zoster vaccine. You may need this after age 59. Pneumococcal 13-valent conjugate (PCV13) vaccine. One dose is  recommended after age 27. Pneumococcal polysaccharide  (PPSV23) vaccine. One dose is recommended after age 24. Talk to your health care provider about which screenings and vaccines you need and how often you need them. This information is not intended to replace advice given to you by your health care provider. Make sure you discuss any questions you have with your health care provider. Document Released: 07/30/2015 Document Revised: 03/22/2016 Document Reviewed: 05/04/2015 Elsevier Interactive Patient Education  2017 [employer] Prevention in the Home Falls can cause injuries. They can happen to people of all ages. There are many things you can do to make your home safe and to help prevent falls. What can I do on the outside of my home? Regularly fix the edges of walkways and driveways and fix any cracks. Remove anything that might make you trip as you walk through a door, such as a raised step or threshold. Trim any bushes or trees on the path to your home. Use bright outdoor lighting. Clear any walking paths of anything that might make someone trip, such as rocks or tools. Regularly check to see if handrails are loose or broken. Make sure that both sides of any steps have handrails. Any raised decks and porches should have guardrails on the edges. Have any leaves, snow, or ice cleared regularly. Use sand or salt on walking paths during winter. Clean up any spills in your garage right away. This includes oil or grease spills. What can I do in the bathroom? Use night lights. Install grab bars by the toilet and in the tub and shower. Do not use towel bars as grab bars. Use non-skid mats or decals in the tub or shower. If you need to sit down in the shower, use a plastic, non-slip stool. Keep the floor dry. Clean up any water that spills on the floor as soon as it happens. Remove soap buildup in the tub or shower regularly. Attach bath mats securely with double-sided non-slip rug tape. Do not have throw rugs and other things on the  floor that can make you trip. What can I do in the bedroom? Use night lights. Make sure that you have a light by your bed that is easy to reach. Do not use any sheets or blankets that are too big for your bed. They should not hang down onto the floor. Have a firm chair that has side arms. You can use this for support while you get dressed. Do not have throw rugs and other things on the floor that can make you trip. What can I do in the kitchen? Clean up any spills right away. Avoid walking on wet floors. Keep items that you use a lot in easy-to-reach places. If you need to reach something above you, use a strong step stool that has a grab bar. Keep electrical cords out of the way. Do not use floor polish or wax that makes floors slippery. If you must use wax, use non-skid floor wax. Do not have throw rugs and other things on the floor that can make you trip. What can I do with my stairs? Do not leave any items on the stairs. Make sure that there are handrails on both sides of the stairs and use them. Fix handrails that are broken or loose. Make sure that handrails are as long as the stairways. Check any carpeting to make sure that it is firmly attached to the stairs. Fix any carpet that is loose or worn. Avoid having  throw rugs at the top or bottom of the stairs. If you do have throw rugs, attach them to the floor with carpet tape. Make sure that you have a light switch at the top of the stairs and the bottom of the stairs. If you do not have them, ask someone to add them for you. What else can I do to help prevent falls? Wear shoes that: Do not have high heels. Have rubber bottoms. Are comfortable and fit you well. Are closed at the toe. Do not wear sandals. If you use a stepladder: Make sure that it is fully opened. Do not climb a closed stepladder. Make sure that both sides of the stepladder are locked into place. Ask someone to hold it for you, if possible. Clearly mark and make  sure that you can see: Any grab bars or handrails. First and last steps. Where the edge of each step is. Use tools that help you move around (mobility aids) if they are needed. These include: Canes. Walkers. Scooters. Crutches. Turn on the lights when you go into a dark area. Replace any light bulbs as soon as they burn out. Set up your furniture so you have a clear path. Avoid moving your furniture around. If any of your floors are uneven, fix them. If there are any pets around you, be aware of where they are. Review your medicines with your doctor. Some medicines can make you feel dizzy. This can increase your chance of falling. Ask your doctor what other things that you can do to help prevent falls. This information is not intended to replace advice given to you by your health care provider. Make sure you discuss any questions you have with your health care provider. Document Released: 04/29/2009 Document Revised: 12/09/2015 Document Reviewed: 08/07/2014 Elsevier Interactive Patient Education  2017 [employer].

## 2022-06-27 NOTE — Assessment & Plan Note (Signed)
Repeat tsh/t4 F/b endocrinology

## 2022-06-28 ENCOUNTER — Encounter: Payer: Self-pay | Admitting: Physician Assistant

## 2022-06-28 LAB — LIPID PANEL
Chol/HDL Ratio: 3.7 ratio (ref 0.0–4.4)
Cholesterol, Total: 202 mg/dL — ABNORMAL HIGH (ref 100–199)
HDL: 55 mg/dL (ref >39)
LDL Chol Calc (NIH): 135 mg/dL — ABNORMAL HIGH (ref 0–99)
Triglycerides: 68 mg/dL (ref 0–149)
VLDL Cholesterol Cal: 12 mg/dL (ref 5–40)

## 2022-06-28 LAB — CBC WITH DIFFERENTIAL/PLATELET
Basophils Absolute: 0 10*3/uL (ref 0.0–0.2)
Basos: 1 %
EOS (ABSOLUTE): 0.1 10*3/uL (ref 0.0–0.4)
Eos: 2 %
Hematocrit: 39.9 % (ref 34.0–46.6)
Hemoglobin: 13.8 g/dL (ref 11.1–15.9)
Immature Grans (Abs): 0 10*3/uL (ref 0.0–0.1)
Immature Granulocytes: 0 %
Lymphocytes Absolute: 1.6 10*3/uL (ref 0.7–3.1)
Lymphs: 28 %
MCH: 32.5 pg (ref 26.6–33.0)
MCHC: 34.6 g/dL (ref 31.5–35.7)
MCV: 94 fL (ref 79–97)
Monocytes Absolute: 0.6 10*3/uL (ref 0.1–0.9)
Monocytes: 10 %
Neutrophils Absolute: 3.4 10*3/uL (ref 1.4–7.0)
Neutrophils: 59 %
Platelets: 223 10*3/uL (ref 150–450)
RBC: 4.25 x10E6/uL (ref 3.77–5.28)
RDW: 11.7 % (ref 11.7–15.4)
WBC: 5.7 10*3/uL (ref 3.4–10.8)

## 2022-06-28 LAB — COMPREHENSIVE METABOLIC PANEL
ALT: 16 IU/L (ref 0–32)
AST: 20 IU/L (ref 0–40)
Albumin/Globulin Ratio: 1.9 (ref 1.2–2.2)
Albumin: 4.4 g/dL (ref 3.8–4.8)
Alkaline Phosphatase: 73 IU/L (ref 44–121)
BUN/Creatinine Ratio: 16 (ref 12–28)
BUN: 16 mg/dL (ref 8–27)
Bilirubin Total: 0.6 mg/dL (ref 0.0–1.2)
CO2: 25 mmol/L (ref 20–29)
Calcium: 9.4 mg/dL (ref 8.7–10.3)
Chloride: 103 mmol/L (ref 96–106)
Creatinine, Ser: 1.03 mg/dL — ABNORMAL HIGH (ref 0.57–1.00)
Globulin, Total: 2.3 g/dL (ref 1.5–4.5)
Glucose: 105 mg/dL — ABNORMAL HIGH (ref 70–99)
Potassium: 4.2 mmol/L (ref 3.5–5.2)
Sodium: 141 mmol/L (ref 134–144)
Total Protein: 6.7 g/dL (ref 6.0–8.5)
eGFR: 57 mL/min/{1.73_m2} — ABNORMAL LOW (ref >59)

## 2022-06-28 LAB — HEMOGLOBIN A1C
Est. average glucose Bld gHb Est-mCnc: 117 mg/dL
Hgb A1c MFr Bld: 5.7 % — ABNORMAL HIGH (ref 4.8–5.6)

## 2022-06-28 LAB — TSH+FREE T4
Free T4: 1.59 ng/dL (ref 0.82–1.77)
TSH: 1.16 u[IU]/mL (ref 0.450–4.500)

## 2022-06-28 LAB — VITAMIN D 25 HYDROXY (VIT D DEFICIENCY, FRACTURES): Vit D, 25-Hydroxy: 35.6 ng/mL (ref 30.0–100.0)

## 2022-06-30 NOTE — Addendum Note (Signed)
Addended by: Smitty Knudsen on: 06/30/2022 09:31 AM   Modules accepted: Orders

## 2022-07-03 ENCOUNTER — Encounter: Payer: Self-pay | Admitting: Physician Assistant

## 2022-07-24 DIAGNOSIS — R7303 Prediabetes: Secondary | ICD-10-CM | POA: Diagnosis not present

## 2022-07-25 LAB — BASIC METABOLIC PANEL
BUN/Creatinine Ratio: 12 (ref 12–28)
BUN: 11 mg/dL (ref 8–27)
CO2: 23 mmol/L (ref 20–29)
Calcium: 9.3 mg/dL (ref 8.7–10.3)
Chloride: 102 mmol/L (ref 96–106)
Creatinine, Ser: 0.9 mg/dL (ref 0.57–1.00)
Glucose: 117 mg/dL — ABNORMAL HIGH (ref 70–99)
Potassium: 4.2 mmol/L (ref 3.5–5.2)
Sodium: 141 mmol/L (ref 134–144)
eGFR: 66 mL/min/{1.73_m2} (ref >59)

## 2022-07-27 ENCOUNTER — Telehealth: Payer: Self-pay

## 2022-07-27 ENCOUNTER — Encounter: Payer: Self-pay | Admitting: Physician Assistant

## 2022-07-27 NOTE — Telephone Encounter (Signed)
Copied from [place] [id number]. Topic: General - Other >> Jul 27, 2022 10:57 AM YPEJYLTE J wrote: Reason for CRM: pt called in for her lab results. Pt says that she was expecting to get them on mychart but hasn't received yet.   Please assist pt further.

## 2022-07-28 NOTE — Telephone Encounter (Signed)
Pt response.  KP

## 2022-07-28 NOTE — Telephone Encounter (Signed)
Pt sent message in [place]. Will respond via Mychart.  KP

## 2022-07-28 NOTE — Telephone Encounter (Signed)
Please review.  KP

## 2022-08-24 ENCOUNTER — Other Ambulatory Visit: Payer: Self-pay | Admitting: Physician Assistant

## 2022-08-24 DIAGNOSIS — F320 Major depressive disorder, single episode, mild: Secondary | ICD-10-CM

## 2022-09-07 DIAGNOSIS — R768 Other specified abnormal immunological findings in serum: Secondary | ICD-10-CM | POA: Diagnosis not present

## 2022-09-07 DIAGNOSIS — L309 Dermatitis, unspecified: Secondary | ICD-10-CM | POA: Diagnosis not present

## 2022-09-07 DIAGNOSIS — M180 Bilateral primary osteoarthritis of first carpometacarpal joints: Secondary | ICD-10-CM | POA: Diagnosis not present

## 2022-09-25 DIAGNOSIS — H6123 Impacted cerumen, bilateral: Secondary | ICD-10-CM | POA: Diagnosis not present

## 2022-09-25 DIAGNOSIS — K219 Gastro-esophageal reflux disease without esophagitis: Secondary | ICD-10-CM | POA: Diagnosis not present

## 2022-09-25 DIAGNOSIS — R1314 Dysphagia, pharyngoesophageal phase: Secondary | ICD-10-CM | POA: Diagnosis not present

## 2022-09-25 DIAGNOSIS — H903 Sensorineural hearing loss, bilateral: Secondary | ICD-10-CM | POA: Diagnosis not present

## 2022-11-01 DIAGNOSIS — H5213 Myopia, bilateral: Secondary | ICD-10-CM | POA: Diagnosis not present

## 2022-11-21 ENCOUNTER — Other Ambulatory Visit: Payer: Self-pay | Admitting: Surgery

## 2022-11-21 DIAGNOSIS — Z1231 Encounter for screening mammogram for malignant neoplasm of breast: Secondary | ICD-10-CM

## 2022-11-22 ENCOUNTER — Encounter: Payer: Self-pay | Admitting: Physician Assistant

## 2022-11-22 ENCOUNTER — Other Ambulatory Visit: Payer: Self-pay | Admitting: *Deleted

## 2022-11-22 ENCOUNTER — Inpatient Hospital Stay
Admission: RE | Admit: 2022-11-22 | Discharge: 2022-11-22 | Disposition: A | Discharge status: Home / Self Care | Payer: Self-pay | Source: Ambulatory Visit | Attending: Physician Assistant | Admitting: Physician Assistant

## 2022-11-22 DIAGNOSIS — Z1231 Encounter for screening mammogram for malignant neoplasm of breast: Secondary | ICD-10-CM

## 2022-12-21 NOTE — Progress Notes (Signed)
I,Vanessa  Vital,acting as a Neurosurgeon for [employer], PA-C.,have documented all relevant documentation on the behalf of Alfredia Ferguson, PA-C,as directed by  Alfredia Ferguson, PA-C while in the presence of Alfredia Ferguson, PA-C.    Established patient visit   Patient: Meredith Scott   DOB: 1945/11/20   77 y.o. Female  MRN: [id number] Visit Date: 12/22/2022  Today's healthcare provider: Alfredia Ferguson, PA-C   Cc. Chronic follow up  Subjective    HPI  Discussed the use of AI scribe software for clinical note transcription with the patient, who gave verbal consent to proceed.  History of Present Illness   The patient presents with bilateral ankle swelling that worsens by the end of the day, particularly when wearing tennis shoes. The swelling improves with elevation and does not persist into the next morning. The patient also notes a discoloration on the skin, though it is unclear whether this is due to sun exposure or a vascular issue.  The patient expresses concerns about kidney function, noting a previous test that suggested possible dehydration. The patient is also concerned about weight gain, which has been a persistent issue since undergoing a hysterectomy thirty years ago. Despite efforts to maintain a healthy diet and regular exercise, the patient has struggled to lose weight.  The patient also mentions a history of asthma, which is currently well-controlled with no recent flares. The patient is due for a mammogram and has an upcoming consultation with a new surgeon.      Medications: Outpatient Medications Prior to Visit  Medication Sig   ascorbic acid (VITAMIN C) 500 MG tablet Take by mouth.    aspirin 81 MG tablet Take 81 mg by mouth daily.   calcium-vitamin D (OSCAL WITH D) 500-200 MG-UNIT tablet Take 1 tablet by mouth daily with breakfast.    celecoxib (CELEBREX) 100 MG capsule Take 100 mg by mouth 2 (two) times daily. PRN   Cholecalciferol (VITAMIN D3) 50 MCG (2000 UT)  CAPS Take 1 capsule by mouth daily.   escitalopram (LEXAPRO) 10 MG tablet TAKE 1/2 TABLET(5 MG) BY MOUTH DAILY   estrogen-methylTESTOSTERone (EST ESTROGENS-METHYLTEST HS) 0.625-1.25 MG tablet TAKE 1 TABLET BY MOUTH EVERY 6-7 DAYS (Patient taking differently: TAKE 1 TABLET BY MOUTH EVERY 7 DAYS)   Fish Oil OIL daily.   Ipratropium-Albuterol (COMBIVENT RESPIMAT) 20-100 MCG/ACT AERS respimat Inhale 1 puff into the lungs every 6 (six) hours as needed for wheezing.   levothyroxine (SYNTHROID, LEVOTHROID) 25 MCG tablet Take 25 mcg by mouth daily before breakfast. 2 on Sunday and Thursday   melatonin 3 MG TABS tablet Take 3 mg by mouth as needed.   PFIZER-BIONT COVID-19 VAC-TRIS SUSP injection    SYMBICORT 160-4.5 MCG/ACT inhaler INHALE 2 PUFFS INTO THE LUNGS TWICE DAILY   Wheat Dextrin (BENEFIBER PO) Take by mouth daily. Or as EOD   zafirlukast (ACCOLATE) 20 MG tablet Take 1 tablet (20 mg total) by mouth 2 (two) times daily before a meal. (Patient taking differently: Take 20 mg by mouth 2 (two) times daily before a meal. As needed)   No facility-administered medications prior to visit.    Review of Systems  All other systems reviewed and are negative.    Objective    BP 136/69 (BP Location: Left Arm, Patient Position: Sitting, Cuff Size: Normal)   Pulse 63   Temp 97.8 F (36.6 C) (Oral)   Resp 13   Ht 5\' 3"  (1.6 m)   Wt 191 lb (86.6 kg)  SpO2 100%   BMI 33.83 kg/m   Physical Exam Constitutional:      General: She is awake.     Appearance: She is well-developed.  HENT:     Head: Normocephalic.  Eyes:     Conjunctiva/sclera: Conjunctivae normal.  Cardiovascular:     Rate and Rhythm: Normal rate and regular rhythm.     Heart sounds: Normal heart sounds.  Pulmonary:     Effort: Pulmonary effort is normal.     Breath sounds: Normal breath sounds.  Musculoskeletal:     Right lower leg: No edema.     Left lower leg: No edema.  Skin:    General: Skin is warm.     Comments: Some  hyperpigmentation to lower calf. Does not extend to ankle or foot (where shoe or sock would cover)  Neurological:     Mental Status: She is alert and oriented to person, place, and time.  Psychiatric:        Attention and Perception: Attention normal.        Mood and Affect: Mood normal.        Speech: Speech normal.        Behavior: Behavior is cooperative.      Results for orders placed or performed in visit on 12/22/22  POCT Urinalysis Dipstick  Result Value Ref Range   Color, UA Yellow    Clarity, UA Clear    Glucose, UA Negative Negative   Bilirubin, UA Negative    Ketones, UA Negative    Spec Grav, UA 1.010 1.010 - 1.025   Blood, UA Trace    pH, UA 6.5 5.0 - 8.0   Protein, UA Negative Negative   Urobilinogen, UA 0.2 0.2 or 1.0 E.U./dL   Nitrite, UA Negative    Leukocytes, UA Negative Negative   Appearance     Odor      Assessment & Plan     Problem List Items Addressed This Visit       Respiratory   Asthma    Stable cont current treatment      Relevant Orders   Comprehensive Metabolic Panel (CMET)     Other   Anxiety    Stable cont meds      Hormone replacement therapy (HRT) - Primary    Pt aware of long-term consequences of HRT use  She is sched. For annual mammogram      Other Visit Diagnoses     History of hematuria       Relevant Orders   POCT Urinalysis Dipstick (Completed)   Lower extremity edema       Discoloration of skin       Weight gain           4. History of hematuria Trace blood noted on dipstick, history of prior normal cystoscopy. -Send urine for microscopy to confirm presence of blood. - POCT Urinalysis Dipstick  5. Lower extremity edema Predominantly in the left ankle, worse at the end of the day and improved with elevation. Likely venous insufficiency, not concerning for heart failure given resolution with elevation and absence of pitting. -Continue elevation as needed.  6. Discoloration of skin Likely sun-related  given distribution and absence of other symptoms of chronic venous stasis. -No intervention needed at this time.  7. Weight gain Frustration with progressive weight gain despite dietary efforts. Discussed potential benefit of increased protein intake. Assessment and Plan      Follow-up: Scheduled for wellness visit on December 16th, and  new appointment with Dr. Payton Mccallum on December 17th.         I, Alfredia Ferguson, PA-C have reviewed all documentation for this visit. The documentation on  12/22/22   for the exam, diagnosis, procedures, and orders are all accurate and complete.  Alfredia Ferguson, PA-C [facility] [address] [place], [place], [address] Office: [phone] Fax: [phone]   [facility]

## 2022-12-22 ENCOUNTER — Other Ambulatory Visit: Payer: Self-pay | Admitting: Physician Assistant

## 2022-12-22 ENCOUNTER — Encounter: Payer: Self-pay | Admitting: Physician Assistant

## 2022-12-22 ENCOUNTER — Ambulatory Visit (INDEPENDENT_AMBULATORY_CARE_PROVIDER_SITE_OTHER): Payer: Medicare HMO | Admitting: Physician Assistant

## 2022-12-22 VITALS — BP 136/69 | HR 63 | Temp 97.8°F | Resp 13 | Ht 63.0 in | Wt 191.0 lb

## 2022-12-22 DIAGNOSIS — R635 Abnormal weight gain: Secondary | ICD-10-CM | POA: Diagnosis not present

## 2022-12-22 DIAGNOSIS — L819 Disorder of pigmentation, unspecified: Secondary | ICD-10-CM | POA: Diagnosis not present

## 2022-12-22 DIAGNOSIS — Z87448 Personal history of other diseases of urinary system: Secondary | ICD-10-CM | POA: Diagnosis not present

## 2022-12-22 DIAGNOSIS — F419 Anxiety disorder, unspecified: Secondary | ICD-10-CM

## 2022-12-22 DIAGNOSIS — R600 Localized edema: Secondary | ICD-10-CM

## 2022-12-22 DIAGNOSIS — Z79890 Hormone replacement therapy (postmenopausal): Secondary | ICD-10-CM

## 2022-12-22 DIAGNOSIS — J4521 Mild intermittent asthma with (acute) exacerbation: Secondary | ICD-10-CM

## 2022-12-22 LAB — POCT URINALYSIS DIPSTICK
Bilirubin, UA: NEGATIVE
Glucose, UA: NEGATIVE
Ketones, UA: NEGATIVE
Leukocytes, UA: NEGATIVE
Nitrite, UA: NEGATIVE
Protein, UA: NEGATIVE
Spec Grav, UA: 1.01 (ref 1.010–1.025)
Urobilinogen, UA: 0.2 E.U./dL
pH, UA: 6.5 (ref 5.0–8.0)

## 2022-12-22 MED ORDER — EST ESTROGENS-METHYLTEST HS 0.625-1.25 MG PO TABS: ORAL_TABLET | ORAL | 2 refills | Status: DC

## 2022-12-22 NOTE — Assessment & Plan Note (Signed)
Stable con't meds 

## 2022-12-22 NOTE — Addendum Note (Signed)
Addended by: Lubertha Basque on: 12/22/2022 10:29 AM   Modules accepted: Orders

## 2022-12-22 NOTE — Addendum Note (Signed)
Addended by: Lubertha Basque on: 12/22/2022 11:01 AM   Modules accepted: Orders

## 2022-12-22 NOTE — Assessment & Plan Note (Signed)
Pt aware of long-term consequences of HRT use  She is sched. For annual mammogram

## 2022-12-22 NOTE — Assessment & Plan Note (Signed)
Stable cont current treatment

## 2022-12-23 LAB — COMPREHENSIVE METABOLIC PANEL
ALT: 18 IU/L (ref 0–32)
AST: 20 IU/L (ref 0–40)
Albumin/Globulin Ratio: 1.8 (ref 1.2–2.2)
Albumin: 4.2 g/dL (ref 3.8–4.8)
Alkaline Phosphatase: 79 IU/L (ref 44–121)
BUN/Creatinine Ratio: 16 (ref 12–28)
BUN: 15 mg/dL (ref 8–27)
Bilirubin Total: 0.6 mg/dL (ref 0.0–1.2)
CO2: 26 mmol/L (ref 20–29)
Calcium: 9.5 mg/dL (ref 8.7–10.3)
Chloride: 101 mmol/L (ref 96–106)
Creatinine, Ser: 0.91 mg/dL (ref 0.57–1.00)
Globulin, Total: 2.4 g/dL (ref 1.5–4.5)
Glucose: 111 mg/dL — ABNORMAL HIGH (ref 70–99)
Potassium: 4.5 mmol/L (ref 3.5–5.2)
Sodium: 140 mmol/L (ref 134–144)
Total Protein: 6.6 g/dL (ref 6.0–8.5)
eGFR: 65 mL/min/{1.73_m2} (ref >59)

## 2022-12-23 LAB — URINALYSIS, MICROSCOPIC ONLY
Bacteria, UA: NONE SEEN
Casts: NONE SEEN /lpf
WBC, UA: NONE SEEN /hpf (ref 0–5)

## 2023-01-05 DIAGNOSIS — Z1231 Encounter for screening mammogram for malignant neoplasm of breast: Secondary | ICD-10-CM | POA: Diagnosis not present

## 2023-01-17 DIAGNOSIS — N6019 Diffuse cystic mastopathy of unspecified breast: Secondary | ICD-10-CM | POA: Diagnosis not present

## 2023-01-17 DIAGNOSIS — K862 Cyst of pancreas: Secondary | ICD-10-CM | POA: Diagnosis not present

## 2023-01-26 DIAGNOSIS — L817 Pigmented purpuric dermatosis: Secondary | ICD-10-CM | POA: Diagnosis not present

## 2023-01-26 DIAGNOSIS — D225 Melanocytic nevi of trunk: Secondary | ICD-10-CM | POA: Diagnosis not present

## 2023-01-26 DIAGNOSIS — L814 Other melanin hyperpigmentation: Secondary | ICD-10-CM | POA: Diagnosis not present

## 2023-01-26 DIAGNOSIS — Z08 Encounter for follow-up examination after completed treatment for malignant neoplasm: Secondary | ICD-10-CM | POA: Diagnosis not present

## 2023-01-26 DIAGNOSIS — D2261 Melanocytic nevi of right upper limb, including shoulder: Secondary | ICD-10-CM | POA: Diagnosis not present

## 2023-01-26 DIAGNOSIS — Z85828 Personal history of other malignant neoplasm of skin: Secondary | ICD-10-CM | POA: Diagnosis not present

## 2023-01-26 DIAGNOSIS — I8393 Asymptomatic varicose veins of bilateral lower extremities: Secondary | ICD-10-CM | POA: Diagnosis not present

## 2023-01-26 DIAGNOSIS — D2272 Melanocytic nevi of left lower limb, including hip: Secondary | ICD-10-CM | POA: Diagnosis not present

## 2023-02-16 ENCOUNTER — Other Ambulatory Visit: Payer: Self-pay | Admitting: Physician Assistant

## 2023-02-16 DIAGNOSIS — F320 Major depressive disorder, single episode, mild: Secondary | ICD-10-CM

## 2023-02-16 NOTE — Telephone Encounter (Signed)
Requested Prescriptions  Pending Prescriptions Disp Refills   escitalopram (LEXAPRO) 10 MG tablet [Pharmacy Med Name: ESCITALOPRAM 10MG  TABLETS] 45 tablet 1    Sig: TAKE 1/2 TABLET(5 MG) BY MOUTH DAILY     Psychiatry:  Antidepressants - SSRI Passed - 02/16/2023 11:19 AM      Passed - Valid encounter within last 6 months    Recent Outpatient Visits           1 month ago History of hematuria   [place] [facility] Alfredia Ferguson, PA-C   7 months ago Encounter for annual physical exam   [facility] Alfredia Ferguson, PA-C   1 year ago Hormone replacement therapy (HRT)   [facility] Alfredia Ferguson, PA-C   1 year ago Acute COVID-19   [place] [facility] [place], Oswaldo Conroy, PA-C   1 year ago Encounter for annual physical exam   [place] [facility] [place], Marzella Schlein, MD       Future Appointments             In 4 months Pardue, Monico Blitz, DO [place] [facility], [facility]

## 2023-02-22 DIAGNOSIS — E039 Hypothyroidism, unspecified: Secondary | ICD-10-CM | POA: Diagnosis not present

## 2023-03-01 DIAGNOSIS — E039 Hypothyroidism, unspecified: Secondary | ICD-10-CM | POA: Diagnosis not present

## 2023-03-01 DIAGNOSIS — E669 Obesity, unspecified: Secondary | ICD-10-CM | POA: Diagnosis not present

## 2023-04-18 ENCOUNTER — Ambulatory Visit (INDEPENDENT_AMBULATORY_CARE_PROVIDER_SITE_OTHER): Payer: Medicare HMO

## 2023-04-18 DIAGNOSIS — Z23 Encounter for immunization: Secondary | ICD-10-CM | POA: Diagnosis not present

## 2023-06-22 ENCOUNTER — Other Ambulatory Visit: Payer: Self-pay | Admitting: Physician Assistant

## 2023-06-22 ENCOUNTER — Other Ambulatory Visit: Payer: Self-pay | Admitting: Family Medicine

## 2023-06-22 ENCOUNTER — Encounter: Payer: Self-pay | Admitting: Family Medicine

## 2023-06-22 DIAGNOSIS — Z79890 Hormone replacement therapy (postmenopausal): Secondary | ICD-10-CM

## 2023-07-03 ENCOUNTER — Encounter: Payer: Medicare HMO | Admitting: Family Medicine

## 2023-07-16 ENCOUNTER — Ambulatory Visit (INDEPENDENT_AMBULATORY_CARE_PROVIDER_SITE_OTHER): Payer: Medicare HMO | Admitting: Family Medicine

## 2023-07-16 ENCOUNTER — Encounter: Payer: Self-pay | Admitting: Family Medicine

## 2023-07-16 VITALS — BP 145/58 | HR 66 | Resp 16 | Ht 63.0 in | Wt 192.8 lb

## 2023-07-16 DIAGNOSIS — Z1231 Encounter for screening mammogram for malignant neoplasm of breast: Secondary | ICD-10-CM

## 2023-07-16 DIAGNOSIS — M150 Primary generalized (osteo)arthritis: Secondary | ICD-10-CM | POA: Diagnosis not present

## 2023-07-16 DIAGNOSIS — E559 Vitamin D deficiency, unspecified: Secondary | ICD-10-CM | POA: Diagnosis not present

## 2023-07-16 DIAGNOSIS — R7303 Prediabetes: Secondary | ICD-10-CM | POA: Diagnosis not present

## 2023-07-16 DIAGNOSIS — I358 Other nonrheumatic aortic valve disorders: Secondary | ICD-10-CM

## 2023-07-16 DIAGNOSIS — Z87448 Personal history of other diseases of urinary system: Secondary | ICD-10-CM

## 2023-07-16 DIAGNOSIS — K219 Gastro-esophageal reflux disease without esophagitis: Secondary | ICD-10-CM | POA: Diagnosis not present

## 2023-07-16 DIAGNOSIS — Z0000 Encounter for general adult medical examination without abnormal findings: Secondary | ICD-10-CM | POA: Insufficient documentation

## 2023-07-16 DIAGNOSIS — Z0001 Encounter for general adult medical examination with abnormal findings: Secondary | ICD-10-CM

## 2023-07-16 DIAGNOSIS — E7800 Pure hypercholesterolemia, unspecified: Secondary | ICD-10-CM | POA: Diagnosis not present

## 2023-07-16 DIAGNOSIS — R002 Palpitations: Secondary | ICD-10-CM | POA: Diagnosis not present

## 2023-07-16 DIAGNOSIS — R600 Localized edema: Secondary | ICD-10-CM | POA: Diagnosis not present

## 2023-07-16 DIAGNOSIS — E039 Hypothyroidism, unspecified: Secondary | ICD-10-CM

## 2023-07-16 LAB — POCT URINALYSIS DIPSTICK
Bilirubin, UA: NEGATIVE
Glucose, UA: NEGATIVE
Ketones, UA: NEGATIVE
Leukocytes, UA: NEGATIVE
Nitrite, UA: NEGATIVE
Protein, UA: NEGATIVE
Spec Grav, UA: 1.01 (ref 1.010–1.025)
Urobilinogen, UA: 0.2 U/dL
pH, UA: 7 (ref 5.0–8.0)

## 2023-07-16 NOTE — Patient Instructions (Addendum)
Please call Dr. Tonna Boehringer to schedule a routine screening mammogram.

## 2023-07-17 ENCOUNTER — Encounter: Payer: Self-pay | Admitting: Family Medicine

## 2023-07-17 DIAGNOSIS — R600 Localized edema: Secondary | ICD-10-CM | POA: Insufficient documentation

## 2023-07-17 DIAGNOSIS — Z87448 Personal history of other diseases of urinary system: Secondary | ICD-10-CM | POA: Insufficient documentation

## 2023-07-17 DIAGNOSIS — M150 Primary generalized (osteo)arthritis: Secondary | ICD-10-CM | POA: Insufficient documentation

## 2023-07-17 DIAGNOSIS — I358 Other nonrheumatic aortic valve disorders: Secondary | ICD-10-CM | POA: Insufficient documentation

## 2023-07-17 LAB — COMPREHENSIVE METABOLIC PANEL
ALT: 18 [IU]/L (ref 0–32)
AST: 19 [IU]/L (ref 0–40)
Albumin: 4.3 g/dL (ref 3.8–4.8)
Alkaline Phosphatase: 79 [IU]/L (ref 44–121)
BUN/Creatinine Ratio: 17 (ref 12–28)
BUN: 15 mg/dL (ref 8–27)
Bilirubin Total: 0.7 mg/dL (ref 0.0–1.2)
CO2: 26 mmol/L (ref 20–29)
Calcium: 9.3 mg/dL (ref 8.7–10.3)
Chloride: 103 mmol/L (ref 96–106)
Creatinine, Ser: 0.89 mg/dL (ref 0.57–1.00)
Globulin, Total: 2.3 g/dL (ref 1.5–4.5)
Glucose: 101 mg/dL — ABNORMAL HIGH (ref 70–99)
Potassium: 4.3 mmol/L (ref 3.5–5.2)
Sodium: 143 mmol/L (ref 134–144)
Total Protein: 6.6 g/dL (ref 6.0–8.5)
eGFR: 67 mL/min/{1.73_m2} (ref >59)

## 2023-07-17 LAB — HEMOGLOBIN A1C
Est. average glucose Bld gHb Est-mCnc: 120 mg/dL
Hgb A1c MFr Bld: 5.8 % — ABNORMAL HIGH (ref 4.8–5.6)

## 2023-07-17 LAB — LIPID PANEL
Chol/HDL Ratio: 3.2 {ratio} (ref 0.0–4.4)
Cholesterol, Total: 178 mg/dL (ref 100–199)
HDL: 55 mg/dL (ref >39)
LDL Chol Calc (NIH): 112 mg/dL — ABNORMAL HIGH (ref 0–99)
Triglycerides: 58 mg/dL (ref 0–149)
VLDL Cholesterol Cal: 11 mg/dL (ref 5–40)

## 2023-07-17 LAB — TSH+FREE T4
Free T4: 1.67 ng/dL (ref 0.82–1.77)
TSH: 2.49 u[IU]/mL (ref 0.450–4.500)

## 2023-07-17 LAB — VITAMIN D 25 HYDROXY (VIT D DEFICIENCY, FRACTURES): Vit D, 25-Hydroxy: 41.1 ng/mL (ref 30.0–100.0)

## 2023-07-17 NOTE — Assessment & Plan Note (Signed)
 Chronic osteoarthritis exacerbated by cold weather, significantly impacting daily activities. Emphasized the importance of remaining active and incorporating exercises to strengthen the abdomen and hips to improve mobility and reduce pain. - Follow-up with Dr. Liberty - Recommend exercises to strengthen abdomen and hips - Provide printed exercise instructions

## 2023-07-17 NOTE — Assessment & Plan Note (Signed)
Patient continues to exhibit mild hematuria on point-of-care urinalysis.  However, she denies any symptoms (flank pain, dysuria, urinary frequency or other symptoms) associated with this.  No acute concerns at this time.  Continue to monitor.

## 2023-07-17 NOTE — Assessment & Plan Note (Signed)
 Occasional chest pain likely related to GERD, with no consistent association with palpitations. Discussed monitoring symptoms and considering further evaluation if pain persists. - Monitor symptoms and consider further evaluation if pain persists

## 2023-07-17 NOTE — Assessment & Plan Note (Signed)
Murmur noted on exam.  Will evaluate for structural abnormalities with echocardiogram as noted.

## 2023-07-17 NOTE — Assessment & Plan Note (Signed)
History of vitamin D deficiency.  Will recheck level today.

## 2023-07-17 NOTE — Assessment & Plan Note (Signed)
History of prediabetes.  Will recheck A1c today.

## 2023-07-17 NOTE — Assessment & Plan Note (Signed)
Patient taking fish oil daily.  She is not on a statin. Will check CMP and lipid panel today.

## 2023-07-17 NOTE — Assessment & Plan Note (Addendum)
 Chronic lower extremity edema, worse in summer and with prolonged standing. Possible venous insufficiency with mild pitting edema observed. Discussed the use of compression stockings, including different degrees of compression, and recommended JOBST brand. Potential need for ultrasound or ankle-brachial index if symptoms worsen. - Recommend patient continue wearing compression stockings - Suggest JOBST brand for better compression, though acknowledged that this may be cost prohibitive. - Consider ultrasound if symptoms worsen - Consider bilateral lower extremity ultrasounds; however, discussed with patient that, unless she is significantly symptomatic, it will likely not change treatment. - Offered referral to vascular specialist for further input, which patient declined at the time being.

## 2023-07-17 NOTE — Assessment & Plan Note (Signed)
 Recheck thyroid levels today. No acute concerns.  Continue to monitor.

## 2023-07-17 NOTE — Assessment & Plan Note (Signed)
 Physical exam overall unremarkable except as noted above. Routine health maintenance and screenings discussed. Blood work to include metabolic panel, kidney function, liver function, electrolytes, hemoglobin A1c, vitamin D , and thyroid  level. Annual wellness visit and mammogram scheduled. - Order metabolic panel, kidney function, liver function, electrolytes, hemoglobin A1c, vitamin D , and thyroid  level - Schedule annual wellness visit - Schedule mammogram in May 2024

## 2023-07-27 ENCOUNTER — Other Ambulatory Visit: Payer: Self-pay | Admitting: Family Medicine

## 2023-07-27 DIAGNOSIS — E039 Hypothyroidism, unspecified: Secondary | ICD-10-CM

## 2023-07-27 DIAGNOSIS — K219 Gastro-esophageal reflux disease without esophagitis: Secondary | ICD-10-CM

## 2023-07-27 DIAGNOSIS — R7303 Prediabetes: Secondary | ICD-10-CM

## 2023-07-27 DIAGNOSIS — Z1231 Encounter for screening mammogram for malignant neoplasm of breast: Secondary | ICD-10-CM

## 2023-07-27 DIAGNOSIS — Z0000 Encounter for general adult medical examination without abnormal findings: Secondary | ICD-10-CM

## 2023-07-27 DIAGNOSIS — Z87448 Personal history of other diseases of urinary system: Secondary | ICD-10-CM

## 2023-07-27 DIAGNOSIS — E559 Vitamin D deficiency, unspecified: Secondary | ICD-10-CM

## 2023-07-27 DIAGNOSIS — E7800 Pure hypercholesterolemia, unspecified: Secondary | ICD-10-CM

## 2023-07-27 DIAGNOSIS — R600 Localized edema: Secondary | ICD-10-CM

## 2023-07-27 DIAGNOSIS — M150 Primary generalized (osteo)arthritis: Secondary | ICD-10-CM

## 2023-07-27 DIAGNOSIS — R002 Palpitations: Secondary | ICD-10-CM

## 2023-07-27 DIAGNOSIS — I358 Other nonrheumatic aortic valve disorders: Secondary | ICD-10-CM

## 2023-08-03 ENCOUNTER — Encounter: Payer: Self-pay | Admitting: Family Medicine

## 2023-08-07 ENCOUNTER — Ambulatory Visit: Payer: Medicare Other | Attending: Family Medicine

## 2023-08-07 DIAGNOSIS — R011 Cardiac murmur, unspecified: Secondary | ICD-10-CM | POA: Diagnosis not present

## 2023-08-07 DIAGNOSIS — I358 Other nonrheumatic aortic valve disorders: Secondary | ICD-10-CM | POA: Diagnosis not present

## 2023-08-07 LAB — ECHOCARDIOGRAM COMPLETE
AR max vel: 2.88 cm2
AV Area VTI: 2.7 cm2
AV Area mean vel: 2.71 cm2
AV Mean grad: 3 mm[Hg]
AV Peak grad: 5.9 mm[Hg]
Ao pk vel: 1.21 m/s
Area-P 1/2: 3.31 cm2
Calc EF: 51.3 %
S' Lateral: 3.3 cm
Single Plane A2C EF: 53.6 %
Single Plane A4C EF: 50.4 %

## 2023-08-13 ENCOUNTER — Other Ambulatory Visit: Payer: Self-pay | Admitting: Family Medicine

## 2023-08-13 DIAGNOSIS — F320 Major depressive disorder, single episode, mild: Secondary | ICD-10-CM

## 2023-08-13 NOTE — Telephone Encounter (Signed)
Called and discussed overall normal echocardiogram with patient. Discussed that mitral prolapse and related regurgitation are fairly mild overall and can just continue to be monitored. Patient did not have any further questions.

## 2023-08-21 ENCOUNTER — Ambulatory Visit (INDEPENDENT_AMBULATORY_CARE_PROVIDER_SITE_OTHER): Payer: Medicare Other

## 2023-08-21 VITALS — BP 136/70 | Ht 63.0 in | Wt 189.9 lb

## 2023-08-21 DIAGNOSIS — Z0000 Encounter for general adult medical examination without abnormal findings: Secondary | ICD-10-CM | POA: Diagnosis not present

## 2023-08-21 NOTE — Progress Notes (Signed)
 Subjective:   Meredith Scott is a 78 y.o. female who presents for Medicare Annual (Subsequent) preventive examination.  Visit Complete: In person  Cardiac Risk Factors include: advanced age (>64men, >47 women);dyslipidemia;sedentary lifestyle;obesity (BMI >30kg/m2)     Objective:    Today's Vitals   08/21/23 0839 08/21/23 0844  BP: 136/70   Weight: 189 lb 14.4 oz (86.1 kg)   Height: 5' 3 (1.6 m)   PainSc:  4    Body mass index is 33.64 kg/m.     08/21/2023    8:51 AM 06/27/2022    8:23 AM 07/20/2021    9:20 AM 06/01/2021    9:47 AM 05/26/2020    9:44 AM 02/13/2020    7:12 AM 05/19/2019    8:50 AM  Advanced Directives  Does Patient Have a Medical Advance Directive? Yes Yes No Yes Yes Yes Yes  Type of Estate Agent of [place];Living will Healthcare Power of [place];Living will  Healthcare Power of [place] of [place];Living will Healthcare Power of [place];Living will Healthcare Power of [place];Living will  Does patient want to make changes to medical advance directive? No - Patient declined No - Patient declined  No - Patient declined     Copy of Healthcare Power of Attorney in Chart? Yes - validated most recent copy scanned in chart (See row information) Yes - validated most recent copy scanned in chart (See row information)  No - copy requested No - copy requested No - copy requested No - copy requested  Would patient like information on creating a medical advance directive?   No - Patient declined        Current Medications (verified) Outpatient Encounter Medications as of 08/21/2023  Medication Sig   ascorbic acid (VITAMIN C) 500 MG tablet Take by mouth.    aspirin 81 MG tablet Take 81 mg by mouth daily.   calcium-vitamin D  (OSCAL WITH D) 500-200 MG-UNIT tablet Take 1 tablet by mouth daily with breakfast.    celecoxib (CELEBREX) 100 MG capsule Take 100 mg by mouth 2 (two) times daily. PRN   Cholecalciferol (VITAMIN D3) 50 MCG  (2000 UT) CAPS Take 1 capsule by mouth daily.   escitalopram  (LEXAPRO ) 10 MG tablet TAKE 1/2 TABLET(5 MG) BY MOUTH DAILY   estrogen-methylTESTOSTERone (EST ESTROGENS -METHYLTEST HS) 0.625-1.25 MG tablet TAKE 1 TABLET BY MOUTH EVERY 7 DAYS   Ipratropium-Albuterol  (COMBIVENT  RESPIMAT) 20-100 MCG/ACT AERS respimat Inhale 1 puff into the lungs every 6 (six) hours as needed for wheezing.   levothyroxine (SYNTHROID, LEVOTHROID) 25 MCG tablet Take 25 mcg by mouth daily before breakfast. 2 on Sunday and Thursday   melatonin 3 MG TABS tablet Take 3 mg by mouth as needed.   PFIZER-BIONT COVID-19 VAC-TRIS SUSP injection    SYMBICORT  160-4.5 MCG/ACT inhaler INHALE 2 PUFFS INTO THE LUNGS TWICE DAILY   Wheat Dextrin (BENEFIBER PO) Take by mouth daily. Or as EOD   zafirlukast  (ACCOLATE ) 20 MG tablet Take 1 tablet (20 mg total) by mouth 2 (two) times daily before a meal. (Patient taking differently: Take 20 mg by mouth 2 (two) times daily before a meal. As needed)   Fish Oil OIL daily.   No facility-administered encounter medications on file as of 08/21/2023.    Allergies (verified) Shellfish allergy, Sulfa antibiotics, and Neosporin [neomycin-bacitracin zn-polymyx]   History: Past Medical History:  Diagnosis Date   Allergy    Anxiety    Asthma    Asymptomatic varicose veins 2011   Breast  screening, unspecified 2013   Colon polyp    Depression off & on lifetime   Diffuse cystic mastopathy 2013   Hernia of abdominal wall    History of hiatal hernia    Hypertension    Hypothyroidism    Dr Deward   Obesity, unspecified 2013   Osteoarthritis    PONV (postoperative nausea and vomiting)    Screening for obesity 2013   Skin cancer of face 2018   squamous/ left cheek   Special screening for malignant neoplasms, colon 2013   Past Surgical History:  Procedure Laterality Date   ABDOMINAL HYSTERECTOMY  1994   BREAST CYST ASPIRATION Right 1997?   breast aspiration d/t infected milk gland   BREAST MASS  EXCISION Left 2011   COLONOSCOPY  2005   done by Dr. Dellie   COLONOSCOPY N/A 01/27/2015   Procedure: COLONOSCOPY;  Surgeon: Louanne KANDICE Dellie, MD;  Location: [facility];  Service: Endoscopy;  Laterality: N/A;   COLONOSCOPY WITH PROPOFOL  N/A 02/13/2020   Procedure: COLONOSCOPY WITH PROPOFOL ;  Surgeon: Dessa Reyes ORN, MD;  Location: [facility];  Service: Endoscopy;  Laterality: N/A;   FINE NEEDLE ASPIRATION     FOOT SURGERY Right    LIPOMA EXCISION  2010   breast bone   SKIN CANCER EXCISION Left 12/2016   under left eye, [facility]   TONGUE BIOPSY     TUBAL LIGATION  Long time ago   Family History  Problem Relation Age of Onset   Liver cancer Mother    Thyroid  disease Mother    Diabetes Father    Hearing loss Father    Healthy Sister    Dementia Sister    Healthy Brother    Lung cancer Maternal Uncle    Arthritis Granddaughter    Lupus Granddaughter    Arthritis Granddaughter    Lupus Granddaughter    Breast cancer Other    Obesity Maternal Grandmother    Social History   Socioeconomic History   Marital status: Married    Spouse name: Not on file   Number of children: 1   Years of education: H/S   Highest education level: 12th grade  Occupational History   Occupation: Retired  Tobacco Use   Smoking status: Never   Smokeless tobacco: Never  Vaping Use   Vaping status: Never Used  Substance and Sexual Activity   Alcohol use: No   Drug use: No   Sexual activity: Not Currently  Other Topics Concern   Not on file  Social History Narrative   Not on file   Social Drivers of Health   Financial Resource Strain: Low Risk  (08/21/2023)   Overall Financial Resource Strain (CARDIA)    Difficulty of Paying Living Expenses: Not hard at all  Food Insecurity: No Food Insecurity (08/21/2023)   Hunger Vital Sign    Worried About Running Out of Food in the Last Year: Never true    Ran Out of Food in the Last Year: Never true  Transportation Needs: No  Transportation Needs (08/21/2023)   PRAPARE - Administrator, Civil Service (Medical): No    Lack of Transportation (Non-Medical): No  Physical Activity: Insufficiently Active (08/21/2023)   Exercise Vital Sign    Days of Exercise per Week: 2 days    Minutes of Exercise per Session: 30 min  Stress: No Stress Concern Present (08/21/2023)   [employer] of Occupational Health - Occupational Stress Questionnaire    Feeling of Stress : Not  at all  Recent Concern: Stress - Stress Concern Present (07/15/2023)   [employer] of Occupational Health - Occupational Stress Questionnaire    Feeling of Stress : To some extent  Social Connections: Moderately Integrated (08/21/2023)   Social Connection and Isolation Panel [NHANES]    Frequency of Communication with Friends and Family: More than three times a week    Frequency of Social Gatherings with Friends and Family: Three times a week    Attends Religious Services: More than 4 times per year    Active Member of Clubs or Organizations: No    Attends Banker Meetings: Never    Marital Status: Married    Tobacco Counseling Counseling given: Not Answered   Clinical Intake:  Pre-visit preparation completed: Yes  Pain : 0-10 Pain Score: 4  Pain Type: Chronic pain Pain Location: Arm Pain Orientation: Right Pain Radiating Towards: SHOULDER Pain Descriptors / Indicators: Aching, Constant Pain Onset: More than a month ago Pain Frequency: Constant Pain Relieving Factors: CELEBREX  Pain Relieving Factors: CELEBREX  BMI - recorded: 33.64 Nutritional Status: BMI > 30  Obese Nutritional Risks: None Diabetes: No  How often do you need to have someone help you when you read instructions, pamphlets, or other written materials from your doctor or pharmacy?: 1 - Never  Interpreter Needed?: No  Information entered by :: JHONNIE DAS, LPN   Activities of Daily Living    08/21/2023    8:52 AM 08/19/2023    9:37 AM   In your present state of health, do you have any difficulty performing the following activities:  Hearing? 0 0  Vision? 0 0  Difficulty concentrating or making decisions? 0 0  Walking or climbing stairs? 1   Comment ARTHRITIS IN KNEES   Dressing or bathing? 0 0  Doing errands, shopping? 0 0  Preparing Food and eating ? N N  Using the Toilet? N N  In the past six months, have you accidently leaked urine? Y Y  Do you have problems with loss of bowel control? N N  Managing your Medications? N N  Managing your Finances? N N  Housekeeping or managing your Housekeeping? N N    Patient Care Team: Donzella Lauraine SAILOR, DO as PCP - General (Family Medicine) Dessa, Reyes ORN, MD (General Surgery) Orman Maize, NP (Inactive) as Nurse Practitioner (Surgery) Roney, Jenna, OD as Referring Physician (Optometry) [facility], [facility] (Dermatology)  Indicate any recent Medical Services you may have received from other than [facility] providers in the past year (date may be approximate).     Assessment:   This is a routine wellness examination for Darriel.  Hearing/Vision screen Hearing Screening - Comments:: NO AIDS Vision Screening - Comments:: WEARS GLASSES ALL THE TIME- MY [facility] IN [place]   Goals Addressed             This Visit's Progress    DIET - INCREASE WATER INTAKE         Depression Screen    08/21/2023    8:49 AM 07/16/2023    7:58 AM 12/22/2022    8:49 AM 06/27/2022    8:21 AM 10/24/2021    9:50 AM 08/16/2021    2:21 PM 06/01/2021    9:45 AM  PHQ 2/9 Scores  PHQ - 2 Score 0 1 0 1 0 0 0  PHQ- 9 Score 0 3 0 3 0  0    Fall Risk    08/21/2023    8:52 AM 08/19/2023  9:37 AM 07/16/2023    7:58 AM 12/22/2022    8:49 AM 06/27/2022    8:24 AM  Fall Risk   Falls in the past year? 0 0 0 0 0  Number falls in past yr: 0  0 0 0  Injury with Fall? 0  0 0 0  Risk for fall due to : No Fall Risks   No Fall Risks No Fall Risks  Follow up Falls prevention discussed;Falls  evaluation completed   Falls evaluation completed Falls prevention discussed;Falls evaluation completed    MEDICARE RISK AT HOME: Medicare Risk at Home Any stairs in or around the home?: No If so, are there any without handrails?: No Home free of loose throw rugs in walkways, pet beds, electrical cords, etc?: Yes Adequate lighting in your home to reduce risk of falls?: Yes Life alert?: No Use of a cane, walker or w/c?: No Grab bars in the bathroom?: Yes Shower chair or bench in shower?: Yes Elevated toilet seat or a handicapped toilet?: Yes  TIMED UP AND GO:  Was the test performed?  Yes  Length of time to ambulate 10 feet: 4 sec Gait steady and fast without use of assistive device    Cognitive Function:        08/21/2023    8:54 AM 06/27/2022    8:32 AM 05/26/2020    9:50 AM 05/19/2019    8:58 AM 05/15/2018    9:00 AM  6CIT Screen  What Year? 0 points 0 points 0 points 0 points 0 points  What month? 0 points  0 points 0 points 0 points  What time? 0 points 0 points 0 points 0 points 0 points  Count back from 20 0 points 0 points 0 points 0 points 0 points  Months in reverse 0 points 0 points 0 points 0 points 0 points  Repeat phrase 0 points 4 points 0 points 0 points 2 points  Total Score 0 points  0 points 0 points 2 points    Immunizations Immunization History  Administered Date(s) Administered   Fluad Quad(high Dose 65+) 04/08/2019, 04/14/2020, 04/21/2021, 03/28/2022   Fluad Trivalent(High Dose 65+) 04/18/2023   Influenza, High Dose Seasonal PF 04/17/2014, 04/26/2015, 04/22/2016, 04/19/2017, 05/04/2018   PFIZER(Purple Top)SARS-COV-2 Vaccination 09/01/2019, 09/22/2019, 06/03/2020, 02/02/2021   Pfizer Covid-19 Vaccine Bivalent Booster 22yrs & up 05/18/2021, 02/03/2022   Pneumococcal Conjugate-13 05/22/2014   Pneumococcal Polysaccharide-23 05/20/2012   Respiratory Syncytial Virus Vaccine,Recomb Aduvanted(Arexvy) 05/26/2022   Td 03/14/2004   Tdap 04/21/2011,  10/11/2021   Zoster Recombinant(Shingrix) 10/25/2017, 01/30/2018   Zoster, Live 12/02/2012    TDAP status: Up to date  Flu Vaccine status: Up to date  Pneumococcal vaccine status: Declined,  Education has been provided regarding the importance of this vaccine but patient still declined. Advised may receive this vaccine at local pharmacy or Health Dept. Aware to provide a copy of the vaccination record if obtained from local pharmacy or Health Dept. Verbalized acceptance and understanding.   Covid-19 vaccine status: Completed vaccines  Qualifies for Shingles Vaccine? Yes   Zostavax completed Yes   Shingrix Completed?: Yes  Screening Tests Health Maintenance  Topic Date Due   MAMMOGRAM  01/04/2023   COVID-19 Vaccine (7 - 2024-25 season) 03/18/2023   Medicare Annual Wellness (AWV)  08/20/2024   Colonoscopy  02/12/2025   DEXA SCAN  08/19/2025   DTaP/Tdap/Td (4 - Td or Tdap) 10/12/2031   Pneumonia Vaccine 35+ Years old  Completed   INFLUENZA VACCINE  Completed  Hepatitis C Screening  Completed   Zoster Vaccines- Shingrix  Completed   HPV VACCINES  Aged Out    Health Maintenance  Health Maintenance Due  Topic Date Due   MAMMOGRAM  01/04/2023   COVID-19 Vaccine (7 - 2024-25 season) 03/18/2023    Colorectal cancer screening: Type of screening: Colonoscopy. Completed 02/13/20. Repeat every 5 years  Mammogram status: Completed SPRING LAST YEAR @ [facility]. Repeat every year  Bone Density status: Completed 08/19/20. Results reflect: Bone density results: OSTEOPENIA. Repeat every 5 years.  Lung Cancer Screening: (Low Dose CT Chest recommended if Age 40-80 years, 20 pack-year currently smoking OR have quit w/in 15years.) does not qualify.    Additional Screening:  Hepatitis C Screening: does qualify; Completed 06/01/17  Vision Screening: Recommended annual ophthalmology exams for early detection of glaucoma and other disorders of the eye. Is the patient up to date with their annual  eye exam?  Yes  Who is the provider or what is the name of the office in which the patient attends annual eye exams? [facility] If pt is not established with a provider, would they like to be referred to a provider to establish care? No .   Dental Screening: Recommended annual dental exams for proper oral hygiene   Community Resource Referral / Chronic Care Management: CRR required this visit?  No   CCM required this visit?  No     Plan:     I have personally reviewed and noted the following in the patient's chart:   Medical and social history Use of alcohol, tobacco or illicit drugs  Current medications and supplements including opioid prescriptions. Patient is not currently taking opioid prescriptions. Functional ability and status Nutritional status Physical activity Advanced directives List of other physicians Hospitalizations, surgeries, and ER visits in previous 12 months Vitals Screenings to include cognitive, depression, and falls Referrals and appointments  In addition, I have reviewed and discussed with patient certain preventive protocols, quality metrics, and best practice recommendations. A written personalized care plan for preventive services as well as general preventive health recommendations were provided to patient.     Jhonnie GORMAN Das, LPN   01/18/7973   After Visit Summary: (In Person-Declined) Patient declined AVS at this time.  Nurse Notes: NONE

## 2023-08-21 NOTE — Patient Instructions (Addendum)
Meredith Scott , Thank you for taking time to come for your Medicare Wellness Visit. I appreciate your ongoing commitment to your health goals. Please review the following plan we discussed and let me know if I can assist you in the future.   Referrals/Orders/Follow-Ups/Clinician Recommendations: NONE  This is a list of the screening recommended for you and due dates:  Health Maintenance  Topic Date Due   Mammogram  01/04/2023   COVID-19 Vaccine (7 - 2024-25 season) 03/18/2023   Medicare Annual Wellness Visit  08/20/2024   Colon Cancer Screening  02/12/2025   DEXA scan (bone density measurement)  08/19/2025   DTaP/Tdap/Td vaccine (4 - Td or Tdap) 10/12/2031   Pneumonia Vaccine  Completed   Flu Shot  Completed   Hepatitis C Screening  Completed   Zoster (Shingles) Vaccine  Completed   HPV Vaccine  Aged Out    Advanced directives: (In Chart) A copy of your advanced directives are scanned into your chart should your provider ever need it.  Next Medicare Annual Wellness Visit scheduled for next year: Yes   08/26/24 @ 8:50 AM IN PERSON

## 2023-09-13 ENCOUNTER — Encounter: Payer: Self-pay | Admitting: Family Medicine

## 2023-09-13 ENCOUNTER — Ambulatory Visit (INDEPENDENT_AMBULATORY_CARE_PROVIDER_SITE_OTHER): Payer: Medicare Other | Admitting: Family Medicine

## 2023-09-13 VITALS — BP 137/61 | HR 69 | Temp 98.2°F | Ht 63.5 in | Wt 188.5 lb

## 2023-09-13 DIAGNOSIS — J069 Acute upper respiratory infection, unspecified: Secondary | ICD-10-CM | POA: Diagnosis not present

## 2023-09-13 DIAGNOSIS — I341 Nonrheumatic mitral (valve) prolapse: Secondary | ICD-10-CM | POA: Diagnosis not present

## 2023-09-13 NOTE — Progress Notes (Signed)
 Acute visit   Patient: Meredith Scott   DOB: 11-02-1945   78 y.o. Female  MRN: [id number]  Chief Complaint  Patient presents with   Cough    Dry cough associated with sneezing, runny nose, loss of appetite and headaches. Reports she is starting to feel a little better but due to asthma she would like to have her chest checked. Husband has acute bronchitis and symptoms are similar but he was having more trouble breathing than she does. Started feeling bad last Thursday but this week took a turn. Headaches are better since she has been able to get stuff out her nose now when blowing and feels worse at night. Mucinex & coricidin cold & cough   Subjective    Discussed the use of AI scribe software for clinical note transcription with the patient, who gave verbal consent to proceed.  History of Present Illness   The patient, with a past medical history of asthma, presents with a week-long history of cough, fatigue, lack of appetite, and drainage. The patient's symptoms started last Wednesday and have worsened this week. The patient reports feeling better in the mornings and worse at night. The patient has been managing symptoms with Delsym and inhalers. The patient also reports a headache that has improved as her drainage has improved. The patient's husband recently had acute bronchitis with similar symptoms, and the patient is concerned about potential chest issues due to her history of asthma. The patient also mentions an echocardiogram done at the beginning of the year, which showed some leakage that the doctor suggested monitoring.        Review of Systems  Objective    BP 137/61 (BP Location: Right Arm, Patient Position: Sitting, Cuff Size: Normal)   Pulse 69   Temp 98.2 F (36.8 C) (Oral)   Ht 5' 3.5" (1.613 m)   Wt 188 lb 8 oz (85.5 kg)   SpO2 100%   BMI 32.87 kg/m   Physical Exam Vitals reviewed.  Constitutional:      General: She is not in acute distress.     Appearance: Normal appearance. She is well-developed. She is not diaphoretic.  HENT:     Head: Normocephalic and atraumatic.     Right Ear: Ear canal and external ear normal. There is impacted cerumen.     Left Ear: Ear canal and external ear normal. There is impacted cerumen.     Nose: Nose normal.     Mouth/Throat:     Mouth: Mucous membranes are moist.     Pharynx: Oropharynx is clear. No oropharyngeal exudate.  Eyes:     General: No scleral icterus.    Conjunctiva/sclera: Conjunctivae normal.     Pupils: Pupils are equal, round, and reactive to light.  Cardiovascular:     Rate and Rhythm: Normal rate and regular rhythm.  Pulmonary:     Effort: Pulmonary effort is normal. No respiratory distress.     Breath sounds: Normal breath sounds. No wheezing or rales.  Musculoskeletal:     Cervical back: Neck supple.     Right lower leg: No edema.     Left lower leg: No edema.  Lymphadenopathy:     Cervical: No cervical adenopathy.  Skin:    General: Skin is warm and dry.  Neurological:     Mental Status: She is alert.       No results found for any visits on 09/13/23.  Assessment & Plan  Problem List Items Addressed This Visit       Cardiovascular and Mediastinum   Mitral valve prolapse   Other Visit Diagnoses       Viral URI    -  Primary           Viral Upper Respiratory Infection Presents with an 8-day history of cough, drainage, fatigue, and lack of appetite, likely contracted from her husband who had acute bronchitis. Symptoms are worse in the evening and improving in the morning. No wheezing or shortness of breath. Lungs are clear on examination. Likely viral etiology given the time course and lack of bacterial infection signs. Post-viral cough is common and may persist after other symptoms resolve. Honey outperforms cough medicines in studies and is recommended. - Continue Delsym for cough - Use honey for cough relief - Use nasal saline for nasal  congestion - Report any worsening symptoms or new fever  Asthma Asthma with seasonal exacerbations. Currently not experiencing an asthma flare. Uses Symbicort seasonally and Combivent as needed. No wheezing on examination. Emphasized the importance of using Symbicort daily during high-risk seasons to prevent flares and reduce the need for rescue inhalers. - Continue Symbicort seasonally (discussed controller vs prn inhaler) - Use Combivent as needed - Monitor for increased frequency of flares and consider daily Symbicort if needed  Mitral Valve Prolapse with Regurgitation Echocardiogram shows mild mitral valve regurgitation with moderate prolapse and mild aortic valve regurgitation. Symptoms include occasional dizziness and chest fluttering. Discussed potential need for cardiology referral for further evaluation and monitoring. Mild regurgitation is common and often related to aging, but moderate prolapse may warrant further evaluation. Cardiologist may recommend regular echocardiograms to monitor progression. - Consider referral to cardiologist Dr. Juliann Pares for further evaluation and monitoring - Monitor symptoms and consider regular echocardiograms as advised by cardiologist  General Health Maintenance Up to date on vaccinations including COVID, RSV, and shingles. Discussed the importance of flu vaccination and its variable efficacy. - Continue routine vaccinations as recommended  Follow-up - Follow up if symptoms worsen or new fever develops - Consider follow-up with cardiologist if needed.       No orders of the defined types were placed in this encounter.    Return if symptoms worsen or fail to improve.      Shirlee Latch, MD  [facility] [phone] (phone) [phone] (fax)  [facility]

## 2023-09-13 NOTE — Telephone Encounter (Signed)
 I guess it's fine. Please do let patient know that Dr Payton Mccallum does have better availability than I do often as my panel is larger and longer established, however.

## 2023-09-25 ENCOUNTER — Encounter: Payer: Self-pay | Admitting: Family Medicine

## 2023-09-25 DIAGNOSIS — H5203 Hypermetropia, bilateral: Secondary | ICD-10-CM | POA: Diagnosis not present

## 2023-09-28 DIAGNOSIS — M5412 Radiculopathy, cervical region: Secondary | ICD-10-CM | POA: Diagnosis not present

## 2023-10-01 DIAGNOSIS — H903 Sensorineural hearing loss, bilateral: Secondary | ICD-10-CM | POA: Diagnosis not present

## 2023-10-01 DIAGNOSIS — H6123 Impacted cerumen, bilateral: Secondary | ICD-10-CM | POA: Diagnosis not present

## 2023-10-02 ENCOUNTER — Encounter: Payer: Self-pay | Admitting: Family Medicine

## 2023-10-02 DIAGNOSIS — R002 Palpitations: Secondary | ICD-10-CM

## 2023-10-02 DIAGNOSIS — I341 Nonrheumatic mitral (valve) prolapse: Secondary | ICD-10-CM

## 2023-10-03 ENCOUNTER — Encounter: Payer: Self-pay | Admitting: Family Medicine

## 2023-10-05 DIAGNOSIS — M5412 Radiculopathy, cervical region: Secondary | ICD-10-CM | POA: Diagnosis not present

## 2023-10-13 ENCOUNTER — Encounter: Payer: Self-pay | Admitting: Family Medicine

## 2023-10-15 ENCOUNTER — Ambulatory Visit: Payer: Self-pay | Admitting: Family Medicine

## 2023-10-15 DIAGNOSIS — M5412 Radiculopathy, cervical region: Secondary | ICD-10-CM | POA: Diagnosis not present

## 2023-10-15 NOTE — Telephone Encounter (Signed)
 Copied from CRM [id number]. Topic: Clinical - Red Word Triage >> Oct 15, 2023  9:08 AM Meredith Scott M wrote: Red Word that prompted transfer to Nurse Triage: Increased Cough and phleghm, head congestion   Chief Complaint: cough Symptoms: deep cough, chest congestion Frequency: intermittent Pertinent Negatives: Patient denies fever Disposition: [] ED /[] Urgent Care (no appt availability in office) / [x] Appointment(In office/virtual)/ []  [facility]/ [] Home Care/ [] Refused Recommended Disposition /[] [facility]/ []  Follow-up with PCP Additional Notes: Taking Mucinex and Delsym, fine during most the day but gets worse late afternoon to evening, care advice provided. Appt scheduled with Dr. Marylene Land at 1 pm on 4/1  Reason for Disposition  [1] Continuous (nonstop) coughing interferes with work or school AND [2] no improvement using cough treatment per Care Advice  Answer Assessment - Initial Assessment Questions 1. ONSET: "When did the cough begin?"      Last week  2. SEVERITY: "How bad is the cough today?"      Getting deeper  3. SPUTUM: "Describe the color of your sputum" (none, dry cough; clear, white, yellow, green)     Greenish yellow  4. HEMOPTYSIS: "Are you coughing up any blood?" If so ask: "How much?" (flecks, streaks, tablespoons, etc.)     No  5. DIFFICULTY BREATHING: "Are you having difficulty breathing?" If Yes, ask: "How bad is it?" (e.g., mild, moderate, severe)    - MILD: No SOB at rest, mild SOB with walking, speaks normally in sentences, can lie down, no retractions, pulse < 100.    - MODERATE: SOB at rest, SOB with minimal exertion and prefers to sit, cannot lie down flat, speaks in phrases, mild retractions, audible wheezing, pulse 100-120.    - SEVERE: Very SOB at rest, speaks in single words, struggling to breathe, sitting hunched forward, retractions, pulse > 120      No  6. FEVER: "Do you have a fever?" If Yes, ask: "What is your temperature, how  was it measured, and when did it start?"     No  7. CARDIAC HISTORY: "Do you have any history of heart disease?" (e.g., heart attack, congestive heart failure)      *No Answer* 8. LUNG HISTORY: "Do you have any history of lung disease?"  (e.g., pulmonary embolus, asthma, emphysema)     Asthma  9. PE RISK FACTORS: "Do you have a history of blood clots?" (or: recent major surgery, recent prolonged travel, bedridden)     No  10. OTHER SYMPTOMS: "Do you have any other symptoms?" (e.g., runny nose, wheezing, chest pain)       Chest congestion, sore throat  11. PREGNANCY: "Is there any chance you are pregnant?" "When was your last menstrual period?"       *No Answer* 12. TRAVEL: "Have you traveled out of the country in the last month?" (e.g., travel history, exposures)       *No Answer*  Protocols used: Cough - Acute Productive-A-AH

## 2023-10-16 ENCOUNTER — Encounter: Payer: Self-pay | Admitting: Family Medicine

## 2023-10-16 ENCOUNTER — Ambulatory Visit: Payer: Self-pay

## 2023-10-16 ENCOUNTER — Ambulatory Visit (INDEPENDENT_AMBULATORY_CARE_PROVIDER_SITE_OTHER): Admitting: Family Medicine

## 2023-10-16 VITALS — BP 132/65 | HR 71 | Temp 98.3°F | Ht 63.5 in | Wt 187.4 lb

## 2023-10-16 DIAGNOSIS — J0140 Acute pansinusitis, unspecified: Secondary | ICD-10-CM | POA: Diagnosis not present

## 2023-10-16 DIAGNOSIS — J4521 Mild intermittent asthma with (acute) exacerbation: Secondary | ICD-10-CM | POA: Diagnosis not present

## 2023-10-16 MED ORDER — AMOXICILLIN-POT CLAVULANATE 875-125 MG PO TABS: 1.0000 | ORAL_TABLET | Freq: Two times a day (BID) | ORAL | 0 refills | Status: DC

## 2023-10-16 MED ORDER — PREDNISONE 20 MG PO TABS: 40.0000 mg | ORAL_TABLET | Freq: Every day | ORAL | 0 refills | Status: AC

## 2023-10-16 NOTE — Progress Notes (Signed)
 Acute visit   Patient: Meredith Scott   DOB: [birth year]   78 y.o. Female  MRN: [id number] PCP: Sherlyn Hay, DO   Chief Complaint  Patient presents with   Cough    Productive cough with greenish yellow sputum X 1 week. Associated with congestion, no fever and hx of asthma. Taking Mucinex and Delsym, fine during most the day but gets worse late afternoon to evening.    Subjective    Discussed the use of AI scribe software for clinical note transcription with the patient, who gave verbal consent to proceed.  History of Present Illness   The patient, with a history of asthma, presents with a deep cough and mucus production that has been ongoing for about a week. The cough is described as deep and alarming to others. The patient reports that the cough and mucus production worsen in the late afternoon and at night. The mucus is described as thick and hard to expel. The patient has been managing the symptoms with Delsym for the cough and Mucinex for the mucus, and reports some relief from these medications. The patient has also been using asthma inhalers since the onset of the current symptoms. The patient denies having a fever.       Review of Systems  Objective    BP 132/65 (BP Location: Right Arm, Patient Position: Sitting, Cuff Size: Normal)   Pulse 71   Temp 98.3 F (36.8 C) (Oral)   Ht 5' 3.5" (1.613 m)   Wt 187 lb 6.4 oz (85 kg)   SpO2 98%   BMI 32.68 kg/m   Physical Exam Vitals reviewed.  Constitutional:      General: She is not in acute distress.    Appearance: Normal appearance. She is well-developed. She is not diaphoretic.  HENT:     Head: Normocephalic and atraumatic.     Right Ear: Tympanic membrane, ear canal and external ear normal.     Left Ear: Tympanic membrane, ear canal and external ear normal.     Nose: Congestion present.     Mouth/Throat:     Mouth: Mucous membranes are moist.     Pharynx: Oropharynx is clear. No oropharyngeal exudate.   Eyes:     General: No scleral icterus.    Conjunctiva/sclera: Conjunctivae normal.  Neck:     Thyroid: No thyromegaly.  Cardiovascular:     Rate and Rhythm: Normal rate and regular rhythm.     Heart sounds: Normal heart sounds. No murmur heard. Pulmonary:     Effort: Pulmonary effort is normal. No respiratory distress.     Breath sounds: Wheezing present. No rhonchi or rales.  Musculoskeletal:     Cervical back: Neck supple.     Right lower leg: No edema.     Left lower leg: No edema.  Lymphadenopathy:     Cervical: No cervical adenopathy.  Skin:    General: Skin is warm and dry.  Neurological:     Mental Status: She is alert. Mental status is at baseline.       No results found for any visits on 10/16/23.  Assessment & Plan     Problem List Items Addressed This Visit       Respiratory   Asthma - Primary   Relevant Medications   predniSONE (DELTASONE) 20 MG tablet   Other Visit Diagnoses       Acute non-recurrent pansinusitis       Relevant Medications   amoxicillin-clavulanate (  AUGMENTIN) 875-125 MG tablet   predniSONE (DELTASONE) 20 MG tablet           Asthma exacerbation Experiencing an asthma exacerbation with a deep, persistent cough, wheezing, and significant mucus production for eight days. Symptoms began with a sore throat and progressed to a deep cough. She uses inhalers daily and finds some relief with Delsym and Mucinex. Examination reveals a coarse wheeze and airway inflammation, likely contributing to the cough and mucus production. The exacerbation may have been triggered by a viral infection or allergies, but the exact cause is unclear. Prednisone is recommended to reduce inflammation and improve breathing. A high dose of 40 mg daily for 7 days is chosen to effectively manage asthma without the need for tapering, minimizing side effects compared to higher doses. - Prescribe prednisone 40 mg daily for 7 days without tapering to reduce airway  inflammation. - Continue using inhalers daily. - Continue Delsym for cough management. - Continue Mucinex for mucus management, but may discontinue if prednisone is effective.  Suspected sinus infection Symptoms suggest a sinus infection, including nasal drainage and thick mucus production. Concern exists about a sinus infection component contributing to respiratory symptoms. Augmentin is chosen for its effectiveness in treating both sinus infections and bronchitis. Risks include gastrointestinal upset and diarrhea, which can be mitigated by taking the medication with food and using probiotics or yogurt. - Prescribe Augmentin (amoxicillin/clavulanate) 875 mg twice daily for 7 days. - Advise taking antibiotics with food to minimize gastrointestinal side effects. - Recommend yogurt or probiotics to mitigate potential antibiotic-associated diarrhea.  Follow-up Advised to monitor symptoms and report any lack of improvement by Friday. She has a scheduled cardiologist appointment on Friday, which is approved to attend with a mask if still coughing. Starting the antibiotic today is recommended, with prednisone beginning tomorrow to avoid nighttime stimulation. - Start antibiotic treatment today with one dose. - Begin prednisone treatment tomorrow morning. - Attend cardiologist appointment on Friday, wearing a mask if still coughing. - Contact the clinic if symptoms do not improve by Friday.        Meds ordered this encounter  Medications   amoxicillin-clavulanate (AUGMENTIN) 875-125 MG tablet    Sig: Take 1 tablet by mouth 2 (two) times daily.    Dispense:  20 tablet    Refill:  0   predniSONE (DELTASONE) 20 MG tablet    Sig: Take 2 tablets (40 mg total) by mouth daily with breakfast for 7 days.    Dispense:  14 tablet    Refill:  0     Return if symptoms worsen or fail to improve.      Shirlee Latch, MD  [facility] [phone] (phone) [phone]  (fax)  [facility]

## 2023-10-16 NOTE — Telephone Encounter (Signed)
 Copied from CRM [id number]. Topic: Clinical - Medication Question >> Oct 16, 2023  4:08 PM Antwanette L wrote: Reason for CRM: Patient has questions about a recent prescription order.Provider submitted an order for amoxixillin-clavulanate (augmentin) but the  bottles say amoxicillin-clavulanate potassium tablet Reason for Disposition  Health Information question, no triage required and triager able to answer question  Answer Assessment - Initial Assessment Questions 1. REASON FOR CALL or QUESTION: "What is your reason for calling today?" or "How can I best help you?" or "What question do you have that I can help answer?"     Patient wanted to ensure that Amoxicillin/Clavulanate Potassium is the same thing as Augmentin. This RN answered patient's questions and no further questions at this time.  Protocols used: Information Only Call - No Triage-A-AH

## 2023-10-18 ENCOUNTER — Encounter: Payer: Self-pay | Admitting: Family Medicine

## 2023-10-19 DIAGNOSIS — E039 Hypothyroidism, unspecified: Secondary | ICD-10-CM | POA: Diagnosis not present

## 2023-10-19 DIAGNOSIS — E66811 Obesity, class 1: Secondary | ICD-10-CM | POA: Diagnosis not present

## 2023-10-19 DIAGNOSIS — J45909 Unspecified asthma, uncomplicated: Secondary | ICD-10-CM | POA: Diagnosis not present

## 2023-10-19 DIAGNOSIS — I341 Nonrheumatic mitral (valve) prolapse: Secondary | ICD-10-CM | POA: Diagnosis not present

## 2023-11-08 ENCOUNTER — Encounter: Payer: Self-pay | Admitting: Family Medicine

## 2023-11-09 ENCOUNTER — Other Ambulatory Visit: Payer: Self-pay | Admitting: Internal Medicine

## 2023-11-09 DIAGNOSIS — I998 Other disorder of circulatory system: Secondary | ICD-10-CM

## 2023-11-12 DIAGNOSIS — M180 Bilateral primary osteoarthritis of first carpometacarpal joints: Secondary | ICD-10-CM | POA: Diagnosis not present

## 2023-11-12 DIAGNOSIS — R768 Other specified abnormal immunological findings in serum: Secondary | ICD-10-CM | POA: Diagnosis not present

## 2023-11-12 DIAGNOSIS — M62838 Other muscle spasm: Secondary | ICD-10-CM | POA: Diagnosis not present

## 2023-11-14 ENCOUNTER — Ambulatory Visit: Admitting: Physician Assistant

## 2023-11-14 VITALS — BP 147/78 | HR 66 | Resp 16 | Ht 63.0 in | Wt 189.5 lb

## 2023-11-14 DIAGNOSIS — K219 Gastro-esophageal reflux disease without esophagitis: Secondary | ICD-10-CM

## 2023-11-14 DIAGNOSIS — J0101 Acute recurrent maxillary sinusitis: Secondary | ICD-10-CM | POA: Diagnosis not present

## 2023-11-14 DIAGNOSIS — J4521 Mild intermittent asthma with (acute) exacerbation: Secondary | ICD-10-CM | POA: Diagnosis not present

## 2023-11-14 NOTE — Progress Notes (Signed)
 Established patient visit  Patient: Meredith Scott   DOB: Jul 27, 1945   78 y.o. Female  MRN: [id number] Visit Date: 11/14/2023  Today's healthcare provider: Blane Bunting, PA-C   Chief Complaint  Patient presents with   Cough    Cough..coughing up  green stuff   Subjective      Discussed the use of AI scribe software for clinical note transcription with the patient, who gave verbal consent to proceed.  History of Present Illness Meredith Scott is a 78 year old female with asthma who presents with recurrent cough and congestion.  Since February, she has experienced recurrent cough and congestion, initially treated as a viral infection. In March, antibiotics and prednisone  were prescribed for a suspected sinus flare-up, leading to improvement. Symptoms recurred after two to three weeks. In April, another course of antibiotics, cough medication, and prednisone  was given, resulting in improvement, though coughing persists but has lessened recently.  Current symptoms include postnasal drip, congestion, and a 'froggy' voice. There is no sore throat, shortness of breath, chest pain, or fever, except for shortness of breath during intense coughing episodes.  Her medications include loratadine , Mucinex, and inhalers Combivent  and Symbicort . Combivent  is used as a rescue inhaler, and Symbicort  usage has increased during the allergy season. She occasionally takes over-the-counter medications for GERD, which is triggered by certain foods.       08/21/2023    8:49 AM 07/16/2023    7:58 AM 12/22/2022    8:49 AM  Depression screen PHQ 2/9  Decreased Interest 0 0 0  Down, Depressed, Hopeless 0 1 0  PHQ - 2 Score 0 1 0  Altered sleeping 0 0 0  Tired, decreased energy 0 0 0  Change in appetite 0 1 0  Feeling bad or failure about yourself  0 0 0  Trouble concentrating 0 0 0  Moving slowly or fidgety/restless 0 1 0  Suicidal thoughts 0 0 0  PHQ-9 Score 0 3 0  Difficult doing work/chores    Not difficult at all      07/16/2023    7:58 AM  GAD 7 : Generalized Anxiety Score  Nervous, Anxious, on Edge 1  Control/stop worrying 0  Worry too much - different things 0  Trouble relaxing 1  Restless 0  Easily annoyed or irritable 1  Afraid - awful might happen 0  Total GAD 7 Score 3  Anxiety Difficulty Not difficult at all    Medications: Outpatient Medications Prior to Visit  Medication Sig   amoxicillin -clavulanate (AUGMENTIN ) 875-125 MG tablet Take 1 tablet by mouth 2 (two) times daily.   ascorbic acid (VITAMIN C) 500 MG tablet Take by mouth.    aspirin 81 MG tablet Take 81 mg by mouth daily.   calcium-vitamin D  (OSCAL WITH D) 500-200 MG-UNIT tablet Take 1 tablet by mouth daily with breakfast.    celecoxib (CELEBREX) 100 MG capsule Take 100 mg by mouth 2 (two) times daily. PRN   Cholecalciferol (VITAMIN D3) 50 MCG (2000 UT) CAPS Take 1 capsule by mouth daily.   escitalopram  (LEXAPRO ) 10 MG tablet TAKE 1/2 TABLET(5 MG) BY MOUTH DAILY   estrogen-methylTESTOSTERone (EST ESTROGENS -METHYLTEST HS) 0.625-1.25 MG tablet TAKE 1 TABLET BY MOUTH EVERY 7 DAYS   Fish Oil OIL daily.   Ipratropium-Albuterol  (COMBIVENT  RESPIMAT) 20-100 MCG/ACT AERS respimat Inhale 1 puff into the lungs every 6 (six) hours as needed for wheezing.   levothyroxine (SYNTHROID, LEVOTHROID) 25 MCG tablet Take 25 mcg by mouth daily before breakfast.  2 on Sunday and Thursday   melatonin 3 MG TABS tablet Take 3 mg by mouth as needed.   PFIZER-BIONT COVID-19 VAC-TRIS SUSP injection    SYMBICORT  160-4.5 MCG/ACT inhaler INHALE 2 PUFFS INTO THE LUNGS TWICE DAILY   Wheat Dextrin (BENEFIBER PO) Take by mouth daily. Or as EOD   zafirlukast  (ACCOLATE ) 20 MG tablet Take 1 tablet (20 mg total) by mouth 2 (two) times daily before a meal. (Patient taking differently: Take 20 mg by mouth 2 (two) times daily before a meal. As needed)   No facility-administered medications prior to visit.    Review of Systems All  negative Except see HPI       Objective    BP (!) 147/78 (BP Location: Left Arm, Patient Position: Sitting, Cuff Size: Normal)   Pulse 66   Resp 16   Ht 5\' 3"  (1.6 m)   Wt 189 lb 8 oz (86 kg)   SpO2 98%   BMI 33.57 kg/m     Physical Exam Vitals reviewed.  Constitutional:      Appearance: She is normal weight.  HENT:     Head: Normocephalic and atraumatic.     Right Ear: Ear canal and external ear normal.     Left Ear: Ear canal and external ear normal.     Nose: Congestion and rhinorrhea present.     Mouth/Throat:     Pharynx: Posterior oropharyngeal erythema present.     Comments: Postnasal drainage noted Eyes:     General: No scleral icterus.       Right eye: No discharge.        Left eye: No discharge.     Extraocular Movements: Extraocular movements intact.     Pupils: Pupils are equal, round, and reactive to light.  Cardiovascular:     Rate and Rhythm: Normal rate and regular rhythm.  Pulmonary:     Effort: Pulmonary effort is normal.     Breath sounds: Normal breath sounds.  Abdominal:     General: Abdomen is flat. Bowel sounds are normal.     Palpations: Abdomen is soft.  Lymphadenopathy:     Cervical: No cervical adenopathy.  Neurological:     Mental Status: She is alert.      No results found for any visits on 11/14/23.      Assessment & Plan Asthma Asthma exacerbation likely due to seasonal allergies and sinus congestion. Symptoms include coughing and congestion without shortness of breath or chest tightness. Current treatment includes Symbicort  and Combivent  inhalers. - Continue regular use of Symbicort  inhaler. - Use Combivent  inhaler as needed for acute symptoms.  Chronic sinusitis with congestion Chronic sinusitis with significant unilateral nasal congestion, postnasal drip, coughing, and voice irritation. Congestion likely exacerbated by seasonal allergies. Discussed switching antihistamines due to decreased effectiveness. - Switch from  loratadine  to Zyrtec for antihistamine therapy. - Use nasal saline spray or gel before Flonase. - Start Flonase nasal spray twice daily for seven days, then reduce to once daily. - Consider pulmonology referral if symptoms persist or worsen. Consider abx/prednisone  if symptoms worsen  Gastroesophageal reflux disease (GERD) GERD may contribute to cough. Discussed GERD's potential contribution to cough and lifestyle modifications. - Start daily use of Pepcid or similar medication for acid reflux. - Avoid reflux-triggering foods and avoid lying down immediately after eating. - Elevate head while sleeping to prevent reflux symptoms.    No orders of the defined types were placed in this encounter.   No follow-ups on file.  The patient was advised to call back or seek an in-person evaluation if the symptoms worsen or if the condition fails to improve as anticipated.  I discussed the assessment and treatment plan with the patient. The patient was provided an opportunity to ask questions and all were answered. The patient agreed with the plan and demonstrated an understanding of the instructions.  I, Llana Deshazo, PA-C have reviewed all documentation for this visit. The documentation on 11/14/2023  for the exam, diagnosis, procedures, and orders are all accurate and complete.  Blane Bunting, [facility], MMS [facility] [phone] (phone) [phone] (fax)  [facility]

## 2023-11-16 ENCOUNTER — Encounter: Payer: Self-pay | Admitting: Physician Assistant

## 2023-11-16 ENCOUNTER — Encounter: Payer: Self-pay | Admitting: Family Medicine

## 2023-11-16 MED ORDER — BUDESONIDE-FORMOTEROL FUMARATE 160-4.5 MCG/ACT IN AERO: 2.0000 | INHALATION_SPRAY | Freq: Two times a day (BID) | RESPIRATORY_TRACT | 3 refills | Status: AC

## 2023-11-20 DIAGNOSIS — K080 Exfoliation of teeth due to systemic causes: Secondary | ICD-10-CM | POA: Diagnosis not present

## 2023-12-24 ENCOUNTER — Encounter

## 2023-12-26 ENCOUNTER — Encounter
Admission: RE | Admit: 2023-12-26 | Discharge: 2023-12-26 | Disposition: A | Discharge status: Home / Self Care | Source: Ambulatory Visit | Attending: Internal Medicine | Admitting: Internal Medicine

## 2023-12-26 DIAGNOSIS — I340 Nonrheumatic mitral (valve) insufficiency: Secondary | ICD-10-CM | POA: Diagnosis not present

## 2023-12-26 DIAGNOSIS — I209 Angina pectoris, unspecified: Secondary | ICD-10-CM | POA: Diagnosis not present

## 2023-12-26 DIAGNOSIS — I998 Other disorder of circulatory system: Secondary | ICD-10-CM | POA: Diagnosis not present

## 2023-12-26 LAB — NM MYOCAR MULTI W/SPECT W/WALL MOTION / EF
Estimated workload: 1
Exercise duration (min): 1 min
Exercise duration (sec): 0 s
MPHR: 143 {beats}/min
Nuc Stress EF: 68 %
Peak HR: 85 {beats}/min
Percent HR: 59 %
Rest HR: 63 {beats}/min
Rest Nuclear Isotope Dose: 10.1 mCi
SDS: 3
SRS: 2
SSS: 4
ST Depression (mm): 0 mm
Stress Nuclear Isotope Dose: 30.7 mCi
TID: 1.04

## 2023-12-26 MED ORDER — TECHNETIUM TC 99M TETROFOSMIN IV KIT
10.1100 | PACK | Freq: Once | INTRAVENOUS | Status: AC | PRN
  Administered 2023-12-26: 10.11 via INTRAVENOUS

## 2023-12-26 MED ORDER — TECHNETIUM TC 99M TETROFOSMIN IV KIT
30.7300 | PACK | Freq: Once | INTRAVENOUS | Status: AC | PRN
  Administered 2023-12-26: 30.73 via INTRAVENOUS

## 2023-12-26 MED ORDER — REGADENOSON 0.4 MG/5ML IV SOLN
0.4000 mg | Freq: Once | INTRAVENOUS | Status: AC
  Administered 2023-12-26: 0.4 mg via INTRAVENOUS
  Filled 2023-12-26: qty 5

## 2024-01-02 ENCOUNTER — Ambulatory Visit: Admitting: Physician Assistant

## 2024-01-02 ENCOUNTER — Encounter: Payer: Self-pay | Admitting: Physician Assistant

## 2024-01-02 VITALS — BP 143/54 | HR 71 | Resp 16 | Ht 63.0 in | Wt 187.0 lb

## 2024-01-02 DIAGNOSIS — R319 Hematuria, unspecified: Secondary | ICD-10-CM

## 2024-01-02 DIAGNOSIS — R030 Elevated blood-pressure reading, without diagnosis of hypertension: Secondary | ICD-10-CM

## 2024-01-02 DIAGNOSIS — N390 Urinary tract infection, site not specified: Secondary | ICD-10-CM | POA: Diagnosis not present

## 2024-01-02 DIAGNOSIS — N393 Stress incontinence (female) (male): Secondary | ICD-10-CM

## 2024-01-02 LAB — POCT URINALYSIS DIPSTICK
Glucose, UA: NEGATIVE
Ketones, UA: NEGATIVE
Nitrite, UA: NEGATIVE
Protein, UA: NEGATIVE
Spec Grav, UA: 1.01 (ref 1.010–1.025)
Urobilinogen, UA: 0.2 U/dL
pH, UA: 6 (ref 5.0–8.0)

## 2024-01-02 MED ORDER — CEPHALEXIN 500 MG PO CAPS
500.0000 mg | ORAL_CAPSULE | Freq: Two times a day (BID) | ORAL | 0 refills | Status: DC
Start: 2024-01-02 — End: 2024-01-14

## 2024-01-02 NOTE — Progress Notes (Signed)
 Established patient visit  Patient: Meredith Scott   DOB: [birth year]   78 y.o. Female  MRN: [id number] Visit Date: 01/02/2024  Today's healthcare provider: Blane Bunting, PA-C   Chief Complaint  Patient presents with   Urinary Tract Infection    Possible UTI? Has been going on for 3 days worse at night with yellow color. Gets better in the morning when drinking water but is still is giving her trouble.   Subjective     HPI     Urinary Tract Infection    Additional comments: Possible UTI? Has been going on for 3 days worse at night with yellow color. Gets better in the morning when drinking water but is still is giving her trouble.      Last edited by Estill Hemming, CMA on 01/02/2024  1:19 PM.       Discussed the use of AI scribe software for clinical note transcription with the patient, who gave verbal consent to proceed.  History of Present Illness Meredith Scott is a 78 year old female who presents with symptoms suggestive of a urinary tract infection.  She experiences burning sensations and increased frequency of urination, especially in the mornings. Her urine appears yellower in the morning, and symptoms improve with increased fluid intake. She is uncertain about the presence of blood in her urine but notes it is not red. She associates the onset of symptoms with increased stress following her sister's death and her granddaughter-in-law's cancer diagnosis. She stopped hormone therapy a month ago, which she had been on since her hysterectomy in 1994 to prevent UTIs. She experiences urinary incontinence and uses pads to manage leakage, maintaining hygiene to prevent irritation, though she sometimes feels rawness externally. There is slight pressure that feels more like a UTI.       01/02/2024    1:33 PM 08/21/2023    8:49 AM 07/16/2023    7:58 AM  Depression screen PHQ 2/9  Decreased Interest 0 0 0  Down, Depressed, Hopeless 1 0 1  PHQ - 2 Score 1 0 1  Altered  sleeping 0 0 0  Tired, decreased energy 0 0 0  Change in appetite 0 0 1  Feeling bad or failure about yourself  0 0 0  Trouble concentrating 0 0 0  Moving slowly or fidgety/restless 0 0 1  Suicidal thoughts 0 0 0  PHQ-9 Score 1 0 3  Difficult doing work/chores Not difficult at all        01/02/2024    1:33 PM 07/16/2023    7:58 AM  GAD 7 : Generalized Anxiety Score  Nervous, Anxious, on Edge 1 1  Control/stop worrying 0 0  Worry too much - different things 0 0  Trouble relaxing 0 1  Restless 0 0  Easily annoyed or irritable 0 1  Afraid - awful might happen 0 0  Total GAD 7 Score 1 3  Anxiety Difficulty Not difficult at all Not difficult at all    Medications: Outpatient Medications Prior to Visit  Medication Sig   ascorbic acid (VITAMIN C) 500 MG tablet Take by mouth.    aspirin 81 MG tablet Take 81 mg by mouth daily.   budesonide -formoterol  (SYMBICORT ) 160-4.5 MCG/ACT inhaler Inhale 2 puffs into the lungs 2 (two) times daily.   calcium-vitamin D  (OSCAL WITH D) 500-200 MG-UNIT tablet Take 1 tablet by mouth daily with breakfast.    celecoxib (CELEBREX) 100 MG capsule Take 100 mg by mouth 2 (two) times  daily. PRN   Cholecalciferol (VITAMIN D3) 50 MCG (2000 UT) CAPS Take 1 capsule by mouth daily.   escitalopram  (LEXAPRO ) 10 MG tablet TAKE 1/2 TABLET(5 MG) BY MOUTH DAILY   estrogen-methylTESTOSTERone (EST ESTROGENS -METHYLTEST HS) 0.625-1.25 MG tablet TAKE 1 TABLET BY MOUTH EVERY 7 DAYS   Fish Oil OIL daily.   Ipratropium-Albuterol  (COMBIVENT  RESPIMAT) 20-100 MCG/ACT AERS respimat Inhale 1 puff into the lungs every 6 (six) hours as needed for wheezing.   levothyroxine (SYNTHROID, LEVOTHROID) 25 MCG tablet Take 25 mcg by mouth daily before breakfast. 2 on Sunday and Thursday   melatonin 3 MG TABS tablet Take 3 mg by mouth as needed.   PFIZER-BIONT COVID-19 VAC-TRIS SUSP injection    Wheat Dextrin (BENEFIBER PO) Take by mouth daily. Or as EOD   zafirlukast  (ACCOLATE ) 20 MG tablet  Take 1 tablet (20 mg total) by mouth 2 (two) times daily before a meal. (Patient taking differently: Take 20 mg by mouth 2 (two) times daily before a meal. As needed)   [DISCONTINUED] amoxicillin -clavulanate (AUGMENTIN ) 875-125 MG tablet Take 1 tablet by mouth 2 (two) times daily.   No facility-administered medications prior to visit.    Review of Systems All negative Except see HPI       Objective    BP (!) 143/54 (BP Location: Left Arm, Patient Position: Sitting, Cuff Size: Normal)   Pulse 71   Resp 16   Ht 5' 3 (1.6 m)   Wt 187 lb (84.8 kg)   SpO2 100%   BMI 33.13 kg/m     Physical Exam Vitals reviewed.  Constitutional:      General: She is not in acute distress.    Appearance: Normal appearance. She is well-developed. She is not diaphoretic.  HENT:     Head: Normocephalic and atraumatic.   Eyes:     General: No scleral icterus.    Conjunctiva/sclera: Conjunctivae normal.   Neck:     Thyroid : No thyromegaly.   Cardiovascular:     Rate and Rhythm: Normal rate and regular rhythm.     Pulses: Normal pulses.     Heart sounds: Normal heart sounds. No murmur heard. Pulmonary:     Effort: Pulmonary effort is normal. No respiratory distress.     Breath sounds: Normal breath sounds. No wheezing, rhonchi or rales.   Musculoskeletal:     Cervical back: Neck supple.     Right lower leg: No edema.     Left lower leg: No edema.  Lymphadenopathy:     Cervical: No cervical adenopathy.   Skin:    General: Skin is warm and dry.     Findings: No rash.   Neurological:     Mental Status: She is alert and oriented to person, place, and time. Mental status is at baseline.   Psychiatric:        Mood and Affect: Mood normal.        Behavior: Behavior normal.      No results found for any visits on 01/02/24.      Assessment & Plan Urinary Tract Infection (UTI) Symptoms and urinalysis indicate UTI. Recent cessation of hormone therapy may contribute to recurrence. -  Initiate cephalexin  500 mg twice daily with meals. - Send urine culture to confirm infection and adjust antibiotics if necessary. - Encourage increased fluid intake. - Schedule follow-up in 7 days.  Urinary Incontinence Incontinence since hysterectomy may contribute to recurrent UTIs. Referral to urogynecology discussed but deferred. - Referral to urogynecology deferred due to  current stress and other health concerns.  Elevated blood pressure reading Elevated blood pressure likely stress-related. No history of hypertension. - Recommend home blood pressure monitoring. - Advise recording readings for next appointment.  Stress Management Significant stress from family issues may impact health. - Encourage self-care and stress management. - Advise maintaining a routine with personal time.    No orders of the defined types were placed in this encounter.   Return in about 1 week (around 01/09/2024) for chronic disease f/u.   The patient was advised to call back or seek an in-person evaluation if the symptoms worsen or if the condition fails to improve as anticipated.  I discussed the assessment and treatment plan with the patient. The patient was provided an opportunity to ask questions and all were answered. The patient agreed with the plan and demonstrated an understanding of the instructions.  I, Aiyana Stegmann, PA-C have reviewed all documentation for this visit. The documentation on 01/02/2024  for the exam, diagnosis, procedures, and orders are all accurate and complete.  Blane Bunting, [facility], MMS [facility] [phone] (phone) [phone] (fax)  [facility]

## 2024-01-03 LAB — MICROALBUMIN, URINE: Microalbumin, Urine: 18.6 ug/mL

## 2024-01-03 LAB — SPECIMEN STATUS REPORT

## 2024-01-04 ENCOUNTER — Ambulatory Visit: Payer: Self-pay | Admitting: Physician Assistant

## 2024-01-04 DIAGNOSIS — I517 Cardiomegaly: Secondary | ICD-10-CM | POA: Diagnosis not present

## 2024-01-04 DIAGNOSIS — I341 Nonrheumatic mitral (valve) prolapse: Secondary | ICD-10-CM | POA: Diagnosis not present

## 2024-01-04 DIAGNOSIS — I340 Nonrheumatic mitral (valve) insufficiency: Secondary | ICD-10-CM | POA: Diagnosis not present

## 2024-01-04 DIAGNOSIS — N390 Urinary tract infection, site not specified: Secondary | ICD-10-CM

## 2024-01-04 DIAGNOSIS — I10 Essential (primary) hypertension: Secondary | ICD-10-CM | POA: Diagnosis not present

## 2024-01-05 ENCOUNTER — Encounter: Payer: Self-pay | Admitting: Physician Assistant

## 2024-01-07 DIAGNOSIS — Z1231 Encounter for screening mammogram for malignant neoplasm of breast: Secondary | ICD-10-CM | POA: Diagnosis not present

## 2024-01-07 NOTE — Progress Notes (Signed)
 Established patient visit  Patient: Meredith Scott   DOB: 09/28/1945   78 y.o. Female  MRN: [id number] Visit Date: 01/10/2024  Today's healthcare provider: Jolynn Spencer, PA-C   Chief Complaint  Patient presents with   Follow-up    7 days f/u   Subjective     HPI     Follow-up    Additional comments: 7 days f/u      Last edited by Marylen Odella CROME, CMA on 01/10/2024 10:55 AM.       Discussed the use of AI scribe software for clinical note transcription with the patient, who gave verbal consent to proceed.  History of Present Illness Meredith Scott is a 78 year old female who presents with a urinary tract infection and incontinence.  She has a urinary tract infection with Klebsiella aeruginosa, presenting with a feeling of pressure but no burning sensation. She generally feels well, without weakness or malaise. Recurrent UTIs have been more challenging to detect recently. Moderate hematuria is consistently present.  Urinary incontinence occurs, especially with sneezing or coughing, and can mimic UTI symptoms. She has experienced both conditions simultaneously. A complete hysterectomy and tapering off hormone replacement therapy are noted.  She takes Lexapro , half a pill daily, for anxiety and depression, which is beneficial. A heart valve problem is monitored but not serious. Previous cystoscopy showed no abnormalities despite hematuria. She has not been seeing a urologist regularly.       01/02/2024    1:33 PM 08/21/2023    8:49 AM 07/16/2023    7:58 AM  Depression screen PHQ 2/9  Decreased Interest 0 0 0  Down, Depressed, Hopeless 1 0 1  PHQ - 2 Score 1 0 1  Altered sleeping 0 0 0  Tired, decreased energy 0 0 0  Change in appetite 0 0 1  Feeling bad or failure about yourself  0 0 0  Trouble concentrating 0 0 0  Moving slowly or fidgety/restless 0 0 1  Suicidal thoughts 0 0 0  PHQ-9 Score 1 0 3  Difficult doing work/chores Not difficult at all         01/02/2024    1:33 PM 07/16/2023    7:58 AM  GAD 7 : Generalized Anxiety Score  Nervous, Anxious, on Edge 1 1  Control/stop worrying 0 0  Worry too much - different things 0 0  Trouble relaxing 0 1  Restless 0 0  Easily annoyed or irritable 0 1  Afraid - awful might happen 0 0  Total GAD 7 Score 1 3  Anxiety Difficulty Not difficult at all Not difficult at all    Medications: Outpatient Medications Prior to Visit  Medication Sig   ascorbic acid (VITAMIN C) 500 MG tablet Take by mouth.    aspirin 81 MG tablet Take 81 mg by mouth daily.   budesonide -formoterol  (SYMBICORT ) 160-4.5 MCG/ACT inhaler Inhale 2 puffs into the lungs 2 (two) times daily.   calcium-vitamin D  (OSCAL WITH D) 500-200 MG-UNIT tablet Take 1 tablet by mouth daily with breakfast.    celecoxib (CELEBREX) 100 MG capsule Take 100 mg by mouth 2 (two) times daily. PRN   cephALEXin  (KEFLEX ) 500 MG capsule Take 1 capsule (500 mg total) by mouth 2 (two) times daily.   Cholecalciferol (VITAMIN D3) 50 MCG (2000 UT) CAPS Take 1 capsule by mouth daily.   doxycycline  (VIBRA -TABS) 100 MG tablet Take 1 tablet (100 mg total) by mouth 2 (two) times daily.   escitalopram  (LEXAPRO ) 10 MG  tablet TAKE 1/2 TABLET(5 MG) BY MOUTH DAILY   estrogen-methylTESTOSTERone (EST ESTROGENS -METHYLTEST HS) 0.625-1.25 MG tablet TAKE 1 TABLET BY MOUTH EVERY 7 DAYS   Fish Oil OIL daily.   Ipratropium-Albuterol  (COMBIVENT  RESPIMAT) 20-100 MCG/ACT AERS respimat Inhale 1 puff into the lungs every 6 (six) hours as needed for wheezing.   levothyroxine (SYNTHROID, LEVOTHROID) 25 MCG tablet Take 25 mcg by mouth daily before breakfast. 2 on Sunday and Thursday   melatonin 3 MG TABS tablet Take 3 mg by mouth as needed.   PFIZER-BIONT COVID-19 VAC-TRIS SUSP injection    Wheat Dextrin (BENEFIBER PO) Take by mouth daily. Or as EOD   zafirlukast  (ACCOLATE ) 20 MG tablet Take 1 tablet (20 mg total) by mouth 2 (two) times daily before a meal. (Patient taking differently:  Take 20 mg by mouth 2 (two) times daily before a meal. As needed)   No facility-administered medications prior to visit.    Review of Systems All negative Except see HPI       Objective    BP 134/71 (BP Location: Right Arm, Patient Position: Sitting, Cuff Size: Normal)   Pulse 72   Resp 16   Ht 5' 3 (1.6 m)   Wt 187 lb (84.8 kg)   SpO2 98%   BMI 33.13 kg/m     Physical Exam Constitutional:      General: She is not in acute distress.    Appearance: Normal appearance.  HENT:     Head: Normocephalic.  Pulmonary:     Effort: Pulmonary effort is normal. No respiratory distress.   Neurological:     Mental Status: She is alert and oriented to person, place, and time. Mental status is at baseline.      Results for orders placed or performed in visit on 01/10/24  Urinalysis, Routine w reflex microscopic  Result Value Ref Range   Specific Gravity, UA 1.012 1.005 - 1.030   pH, UA 6.0 5.0 - 7.5   Color, UA Yellow Yellow   Appearance Ur Clear Clear   Leukocytes,UA Negative Negative   Protein,UA Negative Negative/Trace   Glucose, UA Negative Negative   Ketones, UA Negative Negative   RBC, UA Negative Negative   Bilirubin, UA Negative Negative   Urobilinogen, Ur 0.2 0.2 - 1.0 mg/dL   Nitrite, UA Negative Negative   Microscopic Examination Comment   POCT urinalysis dipstick  Result Value Ref Range   Color, UA yellow    Clarity, UA clear    Glucose, UA Negative Negative   Bilirubin, UA Moderate    Ketones, UA Moderate    Spec Grav, UA 1.015 1.010 - 1.025   Blood, UA Moderate    pH, UA 6.0 5.0 - 8.0   Protein, UA Positive (A) Negative   Urobilinogen, UA 0.2 0.2 or 1.0 E.U./dL   Nitrite, UA negative    Leukocytes, UA Negative Negative   Appearance     Odor          Assessment & Plan Urinary Tract Infection (UTI) UTI caused by Klebsiella aeruginosa. Previous antibiotics ineffective. Doxycycline  chosen for lower side effect profile. Informed about avoiding  calcium around doxycycline  intake. - Prescribe doxycycline . - Instruct to avoid calcium two hours before and after taking doxycycline . - Perform urine culture after completion of antibiotics to confirm resolution of infection.  Hematuria Moderate hematuria likely related to UTI. Requires further evaluation if persistent. Previous cystoscopy normal but was done a few years a Dynastat is a continued this translates would you be  yeah please go - Monitor hematuria and evaluate with urine culture results. - Consider referral to urology if hematuria persists after UTI treatment.  Urinary Incontinence Incontinence since hysterectomy, possibly mimicking UTI symptoms. Referral to urogynecologist recommended. - Check with scheduler about urogynecology referral. - Recommend seeing a urogynecologist for incontinence management.  Hormone Replacement Therapy (HRT) Management Post-hysterectomy, tapering HRT due to cancer risk. Discussed low-dose antidepressants for hormone withdrawal symptoms. Further discussion with primary care provider planned. - Discuss HRT management and potential antidepressant use with primary care provider on Monday.    Orders Placed This Encounter  Procedures   CULTURE, URINE COMPREHENSIVE   Urinalysis, Routine w reflex microscopic   POCT urinalysis dipstick    No follow-ups on file.   The patient was advised to call back or seek an in-person evaluation if the symptoms worsen or if the condition fails to improve as anticipated.  I discussed the assessment and treatment plan with the patient. The patient was provided an opportunity to ask questions and all were answered. The patient agreed with the plan and demonstrated an understanding of the instructions.  I, Jeremie Giangrande, PA-C have reviewed all documentation for this visit. The documentation on 01/10/2024  for the exam, diagnosis, procedures, and orders are all accurate and complete.  Jolynn Spencer, [facility],  MMS [facility] [phone] (phone) [phone] (fax)  [facility]

## 2024-01-08 LAB — SPECIMEN STATUS REPORT

## 2024-01-08 LAB — CULTURE, URINE COMPREHENSIVE

## 2024-01-08 MED ORDER — DOXYCYCLINE HYCLATE 100 MG PO TABS
100.0000 mg | ORAL_TABLET | Freq: Two times a day (BID) | ORAL | 0 refills | Status: DC
Start: 2024-01-08 — End: 2024-03-14

## 2024-01-08 NOTE — Progress Notes (Signed)
 If patient agrees, prescription for doxycycline was sent to her pharmacy

## 2024-01-08 NOTE — Telephone Encounter (Signed)
 Patient already contacted in separate encounter (results follow-up with initial date 01/04/2024) and doxycycline was sent to the pharmacy today for her to start to more effectively address her infection.

## 2024-01-10 ENCOUNTER — Ambulatory Visit (INDEPENDENT_AMBULATORY_CARE_PROVIDER_SITE_OTHER): Admitting: Physician Assistant

## 2024-01-10 VITALS — BP 134/71 | HR 72 | Resp 16 | Ht 63.0 in | Wt 187.0 lb

## 2024-01-10 DIAGNOSIS — R030 Elevated blood-pressure reading, without diagnosis of hypertension: Secondary | ICD-10-CM

## 2024-01-10 DIAGNOSIS — N390 Urinary tract infection, site not specified: Secondary | ICD-10-CM | POA: Diagnosis not present

## 2024-01-10 DIAGNOSIS — R319 Hematuria, unspecified: Secondary | ICD-10-CM

## 2024-01-10 DIAGNOSIS — N393 Stress incontinence (female) (male): Secondary | ICD-10-CM | POA: Diagnosis not present

## 2024-01-10 LAB — POCT URINALYSIS DIPSTICK
Glucose, UA: NEGATIVE
Leukocytes, UA: NEGATIVE
Nitrite, UA: NEGATIVE
Protein, UA: POSITIVE — AB
Spec Grav, UA: 1.015 (ref 1.010–1.025)
Urobilinogen, UA: 0.2 U/dL
pH, UA: 6 (ref 5.0–8.0)

## 2024-01-11 ENCOUNTER — Ambulatory Visit: Payer: Self-pay | Admitting: Physician Assistant

## 2024-01-11 ENCOUNTER — Encounter: Payer: Self-pay | Admitting: Physician Assistant

## 2024-01-11 LAB — URINALYSIS, ROUTINE W REFLEX MICROSCOPIC
Bilirubin, UA: NEGATIVE
Glucose, UA: NEGATIVE
Ketones, UA: NEGATIVE
Leukocytes,UA: NEGATIVE
Nitrite, UA: NEGATIVE
Protein,UA: NEGATIVE
RBC, UA: NEGATIVE
Specific Gravity, UA: 1.012 (ref 1.005–1.030)
Urobilinogen, Ur: 0.2 mg/dL (ref 0.2–1.0)
pH, UA: 6 (ref 5.0–7.5)

## 2024-01-14 ENCOUNTER — Encounter: Payer: Self-pay | Admitting: Family Medicine

## 2024-01-14 ENCOUNTER — Ambulatory Visit (INDEPENDENT_AMBULATORY_CARE_PROVIDER_SITE_OTHER): Payer: Self-pay | Admitting: Family Medicine

## 2024-01-14 VITALS — BP 125/49 | HR 65 | Resp 14 | Ht 63.0 in | Wt 190.3 lb

## 2024-01-14 DIAGNOSIS — Z79890 Hormone replacement therapy (postmenopausal): Secondary | ICD-10-CM | POA: Diagnosis not present

## 2024-01-14 DIAGNOSIS — D490 Neoplasm of unspecified behavior of digestive system: Secondary | ICD-10-CM | POA: Diagnosis not present

## 2024-01-14 DIAGNOSIS — N3946 Mixed incontinence: Secondary | ICD-10-CM | POA: Diagnosis not present

## 2024-01-14 DIAGNOSIS — N390 Urinary tract infection, site not specified: Secondary | ICD-10-CM | POA: Diagnosis not present

## 2024-01-14 DIAGNOSIS — N6019 Diffuse cystic mastopathy of unspecified breast: Secondary | ICD-10-CM | POA: Diagnosis not present

## 2024-01-14 MED ORDER — PREMARIN 0.625 MG/GM VA CREA
1.0000 | TOPICAL_CREAM | VAGINAL | 12 refills | Status: DC
Start: 2024-01-14 — End: 2024-03-19

## 2024-01-14 NOTE — Progress Notes (Signed)
 Established patient visit   Patient: Meredith Scott   DOB: Mar 02, 1946   78 y.o. Female  MRN: [id number] Visit Date: 01/14/2024  Today's healthcare provider: LAURAINE LOISE BUOY, DO   Chief Complaint  Patient presents with   Follow-up    Discuss hormone medication   Dysuria    Pt currently still taking medication for her urine.    Subjective    Dysuria   Meredith Scott is a 78 year old female with recurrent UTIs who presents for follow-up regarding urinary symptoms and hormone therapy management.  She has a history of recurrent urinary tract infections, with a recent severe episode involving a Klebsiella. Initially treated with an antibiotic that was later found to be resistant, she noted some improvement while on it. Currently, she is on doxycycline , which she will complete tomorrow. She has not experienced typical UTI symptoms but notes a decrease in urinary frequency despite increased fluid intake.  She has been using oral hormone therapy since 1994, primarily to prevent UTIs. Over the past five years, she has faced challenges in obtaining refills and has stretched her medication to one dose every nine days. She recently ran out and decided to try without it. She associates her hormone therapy with a reduction in UTI frequency, having had a significant gap between infections until a stressful period three years ago.  She underwent a total hysterectomy and experiences stress incontinence, requiring the use of pads for protection against leakage during activities like sneezing or coughing. She has not tried vaginal estrogen, having only used oral forms, and recalls a past addition of testosterone to her regimen due to low sexual desire.  She has a history of asthma, which can be exacerbated by colds or pollen, and uses zafirlukast  as needed. She has not used it recently but keeps it available for potential asthma flare-ups.  She experiences neck stiffness and has a history of  osteoarthritis, which limits her neck mobility. She is not currently on regular medication for this condition.  She is scheduled for a mammogram consultation today and has completed her COVID vaccinations. No chest pain, shortness of breath, dizziness, lightheadedness, or headaches.      Medications: Outpatient Medications Prior to Visit  Medication Sig   ascorbic acid (VITAMIN C) 500 MG tablet Take by mouth.    aspirin 81 MG tablet Take 81 mg by mouth daily.   budesonide -formoterol  (SYMBICORT ) 160-4.5 MCG/ACT inhaler Inhale 2 puffs into the lungs 2 (two) times daily.   calcium-vitamin D  (OSCAL WITH D) 500-200 MG-UNIT tablet Take 1 tablet by mouth daily with breakfast.    celecoxib (CELEBREX) 100 MG capsule Take 100 mg by mouth 2 (two) times daily. PRN   Cholecalciferol (VITAMIN D3) 50 MCG (2000 UT) CAPS Take 1 capsule by mouth daily.   doxycycline  (VIBRA -TABS) 100 MG tablet Take 1 tablet (100 mg total) by mouth 2 (two) times daily.   escitalopram  (LEXAPRO ) 10 MG tablet TAKE 1/2 TABLET(5 MG) BY MOUTH DAILY   estrogen-methylTESTOSTERone (EST ESTROGENS -METHYLTEST HS) 0.625-1.25 MG tablet TAKE 1 TABLET BY MOUTH EVERY 7 DAYS   Fish Oil OIL daily.   Ipratropium-Albuterol  (COMBIVENT  RESPIMAT) 20-100 MCG/ACT AERS respimat Inhale 1 puff into the lungs every 6 (six) hours as needed for wheezing.   levothyroxine (SYNTHROID, LEVOTHROID) 25 MCG tablet Take 25 mcg by mouth daily before breakfast. 2 on Sunday and Thursday   melatonin 3 MG TABS tablet Take 3 mg by mouth as needed.   PFIZER-BIONT COVID-19  VAC-TRIS SUSP injection    Wheat Dextrin (BENEFIBER PO) Take by mouth daily. Or as EOD   zafirlukast  (ACCOLATE ) 20 MG tablet Take 1 tablet (20 mg total) by mouth 2 (two) times daily before a meal. (Patient taking differently: Take 20 mg by mouth 2 (two) times daily before a meal. As needed)   [DISCONTINUED] cephALEXin  (KEFLEX ) 500 MG capsule Take 1 capsule (500 mg total) by mouth 2 (two) times daily.    No facility-administered medications prior to visit.        Objective    BP (!) 125/49 (BP Location: Left Arm, Patient Position: Sitting, Cuff Size: Large)   Pulse 65   Resp 14   Ht 5' 3 (1.6 m)   Wt 190 lb 4.8 oz (86.3 kg)   SpO2 98%   BMI 33.71 kg/m     Physical Exam Vitals and nursing note reviewed.  Constitutional:      General: She is not in acute distress.    Appearance: Normal appearance.  HENT:     Head: Normocephalic and atraumatic.   Eyes:     General: No scleral icterus.    Conjunctiva/sclera: Conjunctivae normal.    Cardiovascular:     Rate and Rhythm: Normal rate.  Pulmonary:     Effort: Pulmonary effort is normal.   Neurological:     Mental Status: She is alert and oriented to person, place, and time. Mental status is at baseline.   Psychiatric:        Mood and Affect: Mood normal.        Behavior: Behavior normal.      No results found for any visits on 01/14/24.  Assessment & Plan    Urinary incontinence, mixed -     Premarin ; Place 1 Applicatorful vaginally 2 (two) times a week.  Dispense: 42.5 g; Refill: 12  Hormone replacement therapy (HRT) -     Premarin ; Place 1 Applicatorful vaginally 2 (two) times a week.  Dispense: 42.5 g; Refill: 12  Recurrent UTI -     Premarin ; Place 1 Applicatorful vaginally 2 (two) times a week.  Dispense: 42.5 g; Refill: 12     Urinary Tract Infection (UTI) Recent severe UTI resistant to multiple antibiotics, improved with doxycycline . No current symptoms. - Complete doxycycline  course. - Monitor for recurrence of UTI symptoms. - Consider urogynecology referral if symptoms persist.  Hormone Replacement Therapy (HRT) Management Long-term oral HRT with concerns about UTIs. Discussed transitioning to vaginal estrogen to reduce risks and prevent UTIs. - Prescribe vaginal estrogen, to be used once or twice a week. - Monitor for improvement in UTI prevention.  Stress Urinary Incontinence Post-total  abdominal hysterectomy and one vaginal delivery ongoing stress urinary incontinence with leakage during sneezing, coughing, or vomiting. Discussed Kegel exercises. - Provide information on Kegel exercises. - Consider pelvic floor physical therapy if symptoms persist. - Consider referral to urogynecology if symptoms worsen  Osteoarthritis Osteoarthritis with neck stiffness and limited range of motion. - Following with orthopedics; defer to specialist management  Asthma Asthma exacerbated by colds and pollen. - Continue Accolate  as needed for asthma exacerbations.  General Health Maintenance Discussed mammograms, COVID-19 booster, RSV, and pneumonia vaccinations. Awaiting mammogram records. Discussed COVID-19 booster for those 67 and older. - Obtain mammogram records from [facility]. - Consider COVID-19 booster in fall. - Ensure vaccinations are up to date.    Follow-up Discussed scheduling follow-up appointments and managing multiple doctor visits. - Schedule follow-up visit in December. - Annual wellness visit in February.  Return in about 6 months (around 07/15/2024) for CPE.      I discussed the assessment and treatment plan with the patient  The patient was provided an opportunity to ask questions and all were answered. The patient agreed with the plan and demonstrated an understanding of the instructions.   The patient was advised to call back or seek an in-person evaluation if the symptoms worsen or if the condition fails to improve as anticipated.    LAURAINE LOISE BUOY, DO  [facility] [phone] (phone) [phone] (fax)  [facility]

## 2024-01-15 ENCOUNTER — Other Ambulatory Visit: Payer: Self-pay | Admitting: Surgery

## 2024-01-15 DIAGNOSIS — D490 Neoplasm of unspecified behavior of digestive system: Secondary | ICD-10-CM

## 2024-01-16 ENCOUNTER — Encounter: Payer: Self-pay | Admitting: Family Medicine

## 2024-01-16 LAB — CULTURE, URINE COMPREHENSIVE

## 2024-01-17 ENCOUNTER — Encounter: Payer: Self-pay | Admitting: Physician Assistant

## 2024-01-17 ENCOUNTER — Telehealth: Payer: Self-pay | Admitting: Physician Assistant

## 2024-01-17 ENCOUNTER — Telehealth: Payer: Self-pay

## 2024-01-17 ENCOUNTER — Other Ambulatory Visit: Payer: Self-pay | Admitting: Physician Assistant

## 2024-01-17 DIAGNOSIS — N39498 Other specified urinary incontinence: Secondary | ICD-10-CM

## 2024-01-17 DIAGNOSIS — N390 Urinary tract infection, site not specified: Secondary | ICD-10-CM

## 2024-01-17 DIAGNOSIS — R319 Hematuria, unspecified: Secondary | ICD-10-CM | POA: Diagnosis not present

## 2024-01-17 MED ORDER — CIPROFLOXACIN HCL 500 MG PO TABS
500.0000 mg | ORAL_TABLET | Freq: Two times a day (BID) | ORAL | 0 refills | Status: DC
Start: 2024-01-17 — End: 2024-03-14

## 2024-01-17 NOTE — Telephone Encounter (Signed)
 Copied from CRM [id number]. Topic: Clinical - Medication Question >> Jan 17, 2024  2:50 PM Berwyn MATSU wrote: Reason for CRM: Patient called in to advise that doctor was going to call her once she consulted with another MD regarding patients bacteria in UTI symptoms, patient called in to advise if she calls to please call her cell phone as she will not be home.  Thank you

## 2024-01-17 NOTE — Addendum Note (Signed)
 Addended by: Angelli Baruch on: 01/17/2024 05:07 PM   Modules accepted: Orders

## 2024-01-17 NOTE — Telephone Encounter (Signed)
 Spoke with patient regarding her current urine culture results/from 01/10/24. Advised to take ciprofloxacin  for 7-14 days vs get IV/IM medications at the ED/hospital. Discussed serious side effects of ciprofloxacin . Advised to take it with meals/yogurt.

## 2024-01-17 NOTE — Telephone Encounter (Signed)
 Patient would like to be reached to her cell phone as she not sure if she will be home when we call. Pt was just following up with provider and if she was able to talk to another doctor in regards to the medication possibly wanting to be prescribed. Pt would like to know before office close, she has seen the referral placement. But would like to know in regards to the medication.

## 2024-01-19 LAB — URINALYSIS, ROUTINE W REFLEX MICROSCOPIC

## 2024-01-19 LAB — SPECIMEN STATUS REPORT

## 2024-01-23 ENCOUNTER — Ambulatory Visit: Payer: Self-pay | Admitting: Physician Assistant

## 2024-01-23 ENCOUNTER — Encounter: Payer: Self-pay | Admitting: Physician Assistant

## 2024-01-23 LAB — URINE CULTURE

## 2024-01-23 LAB — SPECIMEN STATUS REPORT

## 2024-01-24 ENCOUNTER — Other Ambulatory Visit: Payer: Self-pay | Admitting: Surgery

## 2024-01-24 ENCOUNTER — Ambulatory Visit
Admission: RE | Admit: 2024-01-24 | Discharge: 2024-01-24 | Disposition: A | Discharge status: Home / Self Care | Source: Ambulatory Visit | Attending: Surgery | Admitting: Surgery

## 2024-01-24 DIAGNOSIS — D490 Neoplasm of unspecified behavior of digestive system: Secondary | ICD-10-CM | POA: Insufficient documentation

## 2024-01-24 DIAGNOSIS — K8020 Calculus of gallbladder without cholecystitis without obstruction: Secondary | ICD-10-CM | POA: Diagnosis not present

## 2024-01-24 MED ORDER — GADOBUTROL 1 MMOL/ML IV SOLN
10.0000 mL | Freq: Once | INTRAVENOUS | Status: AC | PRN
  Administered 2024-01-24: 10 mL via INTRAVENOUS

## 2024-01-27 NOTE — Telephone Encounter (Signed)
 err

## 2024-02-13 ENCOUNTER — Other Ambulatory Visit: Payer: Self-pay | Admitting: Family Medicine

## 2024-02-13 DIAGNOSIS — Z79890 Hormone replacement therapy (postmenopausal): Secondary | ICD-10-CM

## 2024-02-19 ENCOUNTER — Ambulatory Visit: Admitting: Urology

## 2024-02-22 DIAGNOSIS — E039 Hypothyroidism, unspecified: Secondary | ICD-10-CM | POA: Diagnosis not present

## 2024-02-28 DIAGNOSIS — E039 Hypothyroidism, unspecified: Secondary | ICD-10-CM | POA: Diagnosis not present

## 2024-03-14 ENCOUNTER — Ambulatory Visit: Payer: Self-pay

## 2024-03-14 ENCOUNTER — Ambulatory Visit (INDEPENDENT_AMBULATORY_CARE_PROVIDER_SITE_OTHER): Admitting: Family Medicine

## 2024-03-14 VITALS — BP 135/49 | HR 66 | Temp 97.7°F | Ht 63.0 in | Wt 188.5 lb

## 2024-03-14 DIAGNOSIS — N3001 Acute cystitis with hematuria: Secondary | ICD-10-CM

## 2024-03-14 DIAGNOSIS — R300 Dysuria: Secondary | ICD-10-CM

## 2024-03-14 LAB — POCT URINALYSIS DIPSTICK
Bilirubin, UA: NEGATIVE
Blood, UA: POSITIVE
Glucose, UA: NEGATIVE
Ketones, UA: NEGATIVE
Nitrite, UA: NEGATIVE
Protein, UA: NEGATIVE
Spec Grav, UA: <=1.005 — AB (ref 1.010–1.025)
Urobilinogen, UA: 0.2 U/dL
pH, UA: 6 (ref 5.0–8.0)

## 2024-03-14 MED ORDER — CEFDINIR 300 MG PO CAPS: 300.0000 mg | ORAL_CAPSULE | Freq: Two times a day (BID) | ORAL | 0 refills | Status: AC

## 2024-03-14 NOTE — Telephone Encounter (Signed)
 FYI Only or Action Required?: FYI only for provider.  Patient was last seen in primary care on 01/14/2024 by Donzella Lauraine SAILOR, DO.  Called Nurse Triage reporting Dysuria.  Symptoms began several days ago.  Interventions attempted: Nothing.  Symptoms are: gradually worsening. Urinary frequency and urgency  Triage Disposition: See Physician Within 24 Hours  Patient/caregiver understands and will follow disposition?: Yes                 Copied from CRM #[id number]. Topic: Clinical - Red Word Triage >> Mar 14, 2024  8:39 AM Larissa RAMAN wrote: [facility] that prompted transfer to Nurse Triage: frequency/urgency with urination Reason for Disposition  Urinating more frequently than usual (i.e., frequency) OR new-onset of the feeling of an urgent need to urinate (i.e., urgency)  Answer Assessment - Initial Assessment Questions 1. SEVERITY: How bad is the pain?  (e.g., Scale 1-10; mild, moderate, or severe)     More discomfort 2. FREQUENCY: How many times have you had painful urination today?      many 3. PATTERN: Is pain present every time you urinate or just sometimes?      Not so much pain - more frequency and urgency 4. ONSET: When did the painful urination start?      3 days ago 5. FEVER: Do you have a fever? If Yes, ask: What is your temperature, how was it measured, and when did it start?     no 6. PAST UTI: Have you had a urine infection before? If Yes, ask: When was the last time? and What happened that time?      yes 7. CAUSE: What do you think is causing the painful urination?  (e.g., UTI, scratch, Herpes sore)     UTI 8. OTHER SYMPTOMS: Do you have any other symptoms? (e.g., blood in urine, flank pain, genital sores, urgency, vaginal discharge)     Back pain  Answer Assessment - Initial Assessment Questions 1. SYMPTOM: What's the main symptom you're concerned about? (e.g., frequency, incontinence)     Urgency and frequency 2. ONSET: When  did the  s/s  start?     A few days ago - bit has had issues all summer 3. PAIN: Is there any pain? If Yes, ask: How bad is it? (Scale: 1-10; mild, moderate, severe)     Not really 4. CAUSE: What do you think is causing the symptoms?     UTI? 5. OTHER SYMPTOMS: Do you have any other symptoms? (e.g., blood in urine, fever, flank pain, pain with urination)     Back pain  Protocols used: Urination Pain - Female-A-AH, Urinary Symptoms-A-AH

## 2024-03-14 NOTE — Progress Notes (Signed)
 Established patient visit   Patient: Meredith Scott   DOB: 10-Sep-1945   78 y.o. Female  MRN: [id number] Visit Date: 03/14/2024  Today's healthcare provider: Nancyann Perry, MD   Chief Complaint  Patient presents with   Acute Visit    Patient reports that she is having urgency and frequency when urinating.  Stated that she is going to see a Insurance underwriter, for incontinence.  Slight burning sensation but she is just not feeling right.  Stated that it started 5 days ago.     Subjective    Discussed the use of AI scribe software for clinical note transcription with the patient, who gave verbal consent to proceed.  History of Present Illness   Meredith Scott is a 78 year old female with recurrent urinary tract infections who presents with symptoms of a bladder infection.  She has been experiencing symptoms of a bladder infection and is concerned about managing these symptoms as she plans to go on vacation next Thursday.  She has a history of recurrent urinary tract infections since discontinuing hormone therapy in May due to age-related risks. Since stopping hormone therapy, she has had two or three infections, with the last one treated with Cefalexin in June and Cipro  in July.  She has a history of taking hormone therapy since a total hysterectomy in 1994. She was advised to stop oral hormone tablets due to potential risks such as clotting and cancer. She has not yet tried the prescribed hormone creams due to concerns about their risks.  She has no known allergies to antibiotics and has previously taken Cefalexin and Cipro  for her infections. She uses [facility] on [address] for her prescriptions.       Medications: Outpatient Medications Prior to Visit  Medication Sig   ascorbic acid (VITAMIN C) 500 MG tablet Take by mouth.    aspirin 81 MG tablet Take 81 mg by mouth daily.   budesonide -formoterol  (SYMBICORT ) 160-4.5 MCG/ACT inhaler Inhale 2 puffs into the  lungs 2 (two) times daily.   calcium-vitamin D  (OSCAL WITH D) 500-200 MG-UNIT tablet Take 1 tablet by mouth daily with breakfast.    celecoxib (CELEBREX) 100 MG capsule Take 100 mg by mouth 2 (two) times daily. PRN   Cholecalciferol (VITAMIN D3) 50 MCG (2000 UT) CAPS Take 1 capsule by mouth daily.   conjugated estrogens  (PREMARIN ) vaginal cream Place 1 Applicatorful vaginally 2 (two) times a week.   escitalopram  (LEXAPRO ) 10 MG tablet TAKE 1/2 TABLET(5 MG) BY MOUTH DAILY   estrogen-methylTESTOSTERone (EST ESTROGENS -METHYLTEST HS) 0.625-1.25 MG tablet TAKE 1 TABLET BY MOUTH EVERY 7 DAYS   Fish Oil OIL daily.   Ipratropium-Albuterol  (COMBIVENT  RESPIMAT) 20-100 MCG/ACT AERS respimat Inhale 1 puff into the lungs every 6 (six) hours as needed for wheezing.   levothyroxine (SYNTHROID, LEVOTHROID) 25 MCG tablet Take 25 mcg by mouth daily before breakfast. 2 on Sunday and Thursday   melatonin 3 MG TABS tablet Take 3 mg by mouth as needed.   PFIZER-BIONT COVID-19 VAC-TRIS SUSP injection    [DISCONTINUED] ciprofloxacin  (CIPRO ) 500 MG tablet Take 1 tablet (500 mg total) by mouth 2 (two) times daily.   [DISCONTINUED] doxycycline  (VIBRA -TABS) 100 MG tablet Take 1 tablet (100 mg total) by mouth 2 (two) times daily.   [DISCONTINUED] Wheat Dextrin (BENEFIBER PO) Take by mouth daily. Or as EOD   [DISCONTINUED] zafirlukast  (ACCOLATE ) 20 MG tablet Take 1 tablet (20 mg total) by mouth 2 (two) times daily before a  meal. (Patient taking differently: Take 20 mg by mouth 2 (two) times daily before a meal. As needed)   No facility-administered medications prior to visit.   Review of Systems     Objective    BP (!) 135/49 (BP Location: Left Arm, Patient Position: Sitting, Cuff Size: Normal)   Pulse 66   Temp 97.7 F (36.5 C) (Oral)   Ht 5' 3 (1.6 m)   Wt 188 lb 8 oz (85.5 kg)   SpO2 99%   BMI 33.39 kg/m   Physical Exam   General appearance: Mildly obese female, cooperative and in no acute distress Head:  Normocephalic, without obvious abnormality, atraumatic Respiratory: Respirations even and unlabored, normal respiratory rate Extremities: All extremities are intact.  Skin: Skin color, texture, turgor normal. No rashes seen  Psych: Appropriate mood and affect. Neurologic: Mental status: Alert, oriented to person, place, and time, thought content appropriate.   Results for orders placed or performed in visit on 03/14/24  POCT Urinalysis Dipstick  Result Value Ref Range   Color, UA Light Yellow    Clarity, UA Clear    Glucose, UA Negative Negative   Bilirubin, UA Negative    Ketones, UA Negative    Spec Grav, UA <=1.005 (A) 1.010 - 1.025   Blood, UA Positive    pH, UA 6.0 5.0 - 8.0   Protein, UA Negative Negative   Urobilinogen, UA 0.2 0.2 or 1.0 E.U./dL   Nitrite, UA Negative    Leukocytes, UA Small (1+) (A) Negative   Appearance     Odor       Assessment & Plan       1. Dysuria (Primary)   2. Acute cystitis with hematuria  - Urine Culture - cefdinir  (OMNICEF ) 300 MG capsule; Take 1 capsule (300 mg total) by mouth 2 (two) times daily for 7 days.  Dispense: 14 capsule; Refill: 0     Nancyann Perry, MD  [facility] [phone] (phone) [phone] (fax)  [facility]

## 2024-03-14 NOTE — Telephone Encounter (Signed)
 noted

## 2024-03-16 LAB — URINE CULTURE

## 2024-03-16 LAB — SPECIMEN STATUS REPORT

## 2024-03-18 ENCOUNTER — Ambulatory Visit: Payer: Self-pay | Admitting: Family Medicine

## 2024-03-19 ENCOUNTER — Ambulatory Visit (INDEPENDENT_AMBULATORY_CARE_PROVIDER_SITE_OTHER): Admitting: Family Medicine

## 2024-03-19 ENCOUNTER — Ambulatory Visit: Payer: Self-pay

## 2024-03-19 ENCOUNTER — Encounter: Payer: Self-pay | Admitting: Family Medicine

## 2024-03-19 VITALS — BP 148/67 | HR 65 | Resp 16 | Wt 188.0 lb

## 2024-03-19 DIAGNOSIS — N3001 Acute cystitis with hematuria: Secondary | ICD-10-CM

## 2024-03-19 DIAGNOSIS — R350 Frequency of micturition: Secondary | ICD-10-CM | POA: Diagnosis not present

## 2024-03-19 DIAGNOSIS — Z780 Asymptomatic menopausal state: Secondary | ICD-10-CM | POA: Diagnosis not present

## 2024-03-19 LAB — POCT URINALYSIS DIPSTICK
Bilirubin, UA: NEGATIVE
Glucose, UA: NEGATIVE
Ketones, UA: NEGATIVE
Nitrite, UA: NEGATIVE
Protein, UA: NEGATIVE
Spec Grav, UA: 1.01 (ref 1.010–1.025)
Urobilinogen, UA: 0.2 U/dL
pH, UA: 7 (ref 5.0–8.0)

## 2024-03-19 MED ORDER — ESTRADIOL 0.1 MG/GM VA CREA
TOPICAL_CREAM | VAGINAL | 2 refills | Status: AC
Start: 2024-03-19 — End: ?

## 2024-03-19 MED ORDER — CIPROFLOXACIN HCL 500 MG PO TABS: 500.0000 mg | ORAL_TABLET | Freq: Two times a day (BID) | ORAL | 0 refills | Status: AC

## 2024-03-19 NOTE — Patient Instructions (Addendum)
                         Contains text generated by Abridge.         VISIT SUMMARY: Today, we discussed your ongoing urinary symptoms and the possible causes, including a urinary tract infection and vaginal atrophy. We have adjusted your treatment plan to better address your symptoms and ordered additional tests to get more information.  YOUR PLAN: RECURRENT URINARY TRACT INFECTION WITH URINARY FREQUENCY AND HEMATURIA: You have been experiencing worsening urinary frequency despite being on antibiotics. Your recent urine culture was inconclusive, and you have a history of infections with Pseudomonas. -We will order a repeat urine culture and urine microscopy to get more information. -If your symptoms persist, we will switch your antibiotic to ciprofloxacin  500 mg twice daily for 3 days. -We will also order a blood draw to check your kidney function and white blood cell count. -Please follow up with urology on October 6th.  POSTMENOPAUSAL VAGINAL ATROPHY: Your urinary symptoms may be related to vaginal atrophy due to stopping hormone replacement therapy. This can cause vaginal dryness and irritation. -Continue using the vaginal estrogen cream. Start with 0.5 mg every night for 2 weeks, then reduce to twice a week. - I have sent in a new prescription that I hope will be more cost effective for your -Consider pelvic floor therapy to help with urinary frequency and incontinence.

## 2024-03-19 NOTE — Telephone Encounter (Signed)
 FYI Only or Action Required?: FYI only for provider.  Patient was last seen in primary care on 03/14/2024 by Gasper Nancyann BRAVO, MD.  Called Nurse Triage reporting Dysuria.  Symptoms began a week ago.  Interventions attempted: Prescription medications: Cefdinir .  Symptoms are: unchanged.  Triage Disposition: See Physician Within 24 Hours  Patient/caregiver understands and will follow disposition?: Yes           Copied from CRM #[id number]. Topic: Clinical - Red Word Triage >> Mar 19, 2024  8:41 AM Mia F wrote: Red Word that prompted transfer to Nurse Triage: Taking an antibotic for a UTI and the antibotic is not helping. She mentions at night she is having hot flashes and feel run down, and burning in the bladder. Has been having UTI's throughout the summer and she is beginning to get frustrated. She will be going out of town tomorrow and worried about having these symptoms while gone. She has been on the current antibiotic since Thursday of last week. Reason for Disposition  [1] Taking antibiotic > 72 hours (3 days) for UTI AND [2] painful urination or frequency is SAME (unchanged, not better)  Answer Assessment - Initial Assessment Questions 1. MAIN SYMPTOM: What is the main symptom you are concerned about? (e.g., painful urination, urine frequency)     Overall feeling unwell 2. BETTER-SAME-WORSE: Are you getting better, staying the same, or getting worse compared to how you felt at your last visit to the doctor (most recent medical visit)?     Patient feeling more fatigued, worse at night 3. PAIN: How bad is the pain?  (e.g., Scale 1-10; mild, moderate, or severe)     Mild 4. FEVER: Do you have a fever? If Yes, ask: What is it, how was it measured, and when did it start?     None 5. OTHER SYMPTOMS: Do you have any other symptoms? (e.g., blood in the urine, flank pain, vaginal discharge)     Burning in bladder 6. DIAGNOSIS: When was the UTI diagnosed? By whom?  Was it a kidney infection, bladder infection or both?     PCP 7. ANTIBIOTIC: What antibiotic(s) are you taking? How many times per day?     Cefidinir 300 mg BID 8. ANTIBIOTIC - START DATE: When did you start taking the antibiotic?     03/14/24  Protocols used: Urinary Tract Infection on Antibiotic Follow-up Call - [facility]

## 2024-03-19 NOTE — Progress Notes (Signed)
 ACUTE VISIT   Patient: Meredith Scott   DOB: 28-May-1946   78 y.o. Female  MRN: [id number]   PCP: Donzella Lauraine SAILOR, DO  Chief Complaint  Patient presents with   Urinary Frequency    Worsening-patient on abx Patient scheduled to see Urology 04/21/2024.   Subjective    HPI HPI     Urinary Frequency    Additional comments: Worsening-patient on abx Patient scheduled to see Urology 04/21/2024.      Last edited by Rosas, Joseline E, CMA on 03/19/2024  9:31 AM.       Discussed the use of AI scribe software for clinical note transcription with the patient, who gave verbal consent to proceed.  History of Present Illness Meredith Scott is a 78 year old female with recurrent urinary tract infections who presents with worsening urinary frequency despite antibiotic treatment.  She has been experiencing worsening urinary frequency despite being on Omnicef  (cefdinir ) 300 mg twice daily for a urinary tract infection. Five days ago, a urine culture was collected, which showed mixed urethral genital flora and was inconclusive. She has not completed the antibiotic course yet, but her symptoms have worsened.  Two months ago, a urine culture grew Pseudomonas, which was susceptible to cefepime, ciprofloxacin , and levofloxacin. She was previously treated with ciprofloxacin  and doxycycline . She feels worse at night and has no energy during the day. She experiences frequent urination and difficulty holding urine, especially at night, which disrupts her sleep.  She has a history of a total hysterectomy in 1994 and was on hormone replacement therapy with Premarin  and testosterone until May. She decided to stop the hormone therapy in May, after which she experienced her first UTI in years. She has been under significant stress since May due to personal and family issues, including the death of her sister and her grandson's wife's cancer diagnosis.  She has a history of hematuria for years and  sometimes experiences a burning sensation after urination. She is currently using a vaginal cream twice a week, which she finds less preferable than oral medication. She is concerned about the frequent use of antibiotics and the potential for resistance.  No burning with urination but reports a burning sensation after urination. Reports frequent urination and difficulty holding urine, especially at night. Allergies include shellfish and sulfa antibiotics.     Medications: Outpatient Medications Prior to Visit  Medication Sig Note   ascorbic acid (VITAMIN C) 500 MG tablet Take by mouth.     aspirin 81 MG tablet Take 81 mg by mouth daily.    budesonide -formoterol  (SYMBICORT ) 160-4.5 MCG/ACT inhaler Inhale 2 puffs into the lungs 2 (two) times daily.    calcium-vitamin D  (OSCAL WITH D) 500-200 MG-UNIT tablet Take 1 tablet by mouth daily with breakfast.     cefdinir  (OMNICEF ) 300 MG capsule Take 1 capsule (300 mg total) by mouth 2 (two) times daily for 7 days.    celecoxib (CELEBREX) 100 MG capsule Take 100 mg by mouth 2 (two) times daily. PRN    Cholecalciferol (VITAMIN D3) 50 MCG (2000 UT) CAPS Take 1 capsule by mouth daily.    escitalopram  (LEXAPRO ) 10 MG tablet TAKE 1/2 TABLET(5 MG) BY MOUTH DAILY    Fish Oil OIL daily.    Ipratropium-Albuterol  (COMBIVENT  RESPIMAT) 20-100 MCG/ACT AERS respimat Inhale 1 puff into the lungs every 6 (six) hours as needed for wheezing.    levothyroxine (SYNTHROID, LEVOTHROID) 25 MCG tablet Take 25 mcg by mouth daily before  breakfast. 2 on Sunday and Thursday    melatonin 3 MG TABS tablet Take 3 mg by mouth as needed.    PFIZER-BIONT COVID-19 VAC-TRIS SUSP injection     [DISCONTINUED] conjugated estrogens  (PREMARIN ) vaginal cream Place 1 Applicatorful vaginally 2 (two) times a week.    [DISCONTINUED] estrogen-methylTESTOSTERone (EST ESTROGENS -METHYLTEST HS) 0.625-1.25 MG tablet TAKE 1 TABLET BY MOUTH EVERY 7 DAYS (Patient not taking: Reported on 03/19/2024) 03/19/2024:  pt no longer taking   No facility-administered medications prior to visit.    Last CBC Lab Results  Component Value Date   WBC 5.7 06/27/2022   HGB 13.8 06/27/2022   HCT 39.9 06/27/2022   MCV 94 06/27/2022   MCH 32.5 06/27/2022   RDW 11.7 06/27/2022   PLT 223 06/27/2022   Last metabolic panel Lab Results  Component Value Date   GLUCOSE 101 (H) 07/16/2023   NA 143 07/16/2023   K 4.3 07/16/2023   CL 103 07/16/2023   CO2 26 07/16/2023   BUN 15 07/16/2023   CREATININE 0.89 07/16/2023   EGFR 67 07/16/2023   CALCIUM 9.3 07/16/2023   PROT 6.6 07/16/2023   ALBUMIN 4.3 07/16/2023   LABGLOB 2.3 07/16/2023   AGRATIO 1.8 12/22/2022   BILITOT 0.7 07/16/2023   ALKPHOS 79 07/16/2023   AST 19 07/16/2023   ALT 18 07/16/2023        Objective    BP (!) 148/67 (BP Location: Right Arm, Patient Position: Sitting, Cuff Size: Normal)   Pulse 65   Resp 16   Wt 188 lb (85.3 kg)   SpO2 99%   BMI 33.30 kg/m   BP Readings from Last 3 Encounters:  03/19/24 (!) 148/67  03/14/24 (!) 135/49  01/14/24 (!) 125/49   Wt Readings from Last 3 Encounters:  03/19/24 188 lb (85.3 kg)  03/14/24 188 lb 8 oz (85.5 kg)  01/14/24 190 lb 4.8 oz (86.3 kg)      Physical Exam   Physical Exam ABDOMEN: No costovertebral angle tenderness. No suprapubic tenderness.   Results for orders placed or performed in visit on 03/19/24  POCT urinalysis dipstick  Result Value Ref Range   Color, UA Yellow    Clarity, UA Cloudy    Glucose, UA Negative Negative   Bilirubin, UA Negative    Ketones, UA Negative    Spec Grav, UA 1.010 1.010 - 1.025   Blood, UA small    pH, UA 7.0 5.0 - 8.0   Protein, UA Negative Negative   Urobilinogen, UA 0.2 0.2 or 1.0 E.U./dL   Nitrite, UA Negative    Leukocytes, UA Small (1+) (A) Negative   Appearance     Odor      Assessment & Plan     Assessment and Plan Assessment & Plan Recurrent urinary tract infection with urinary frequency and hematuria Worsening  urinary frequency despite cefdinir  for presumed UTI. Urine culture from five days ago showed mixed urethral genital flora, inconclusive for UTI. Previous culture two months ago showed pseudomonas sensitive to multiple antibiotics including ciprofloxacin . Current urinalysis shows hematuria and leukocytes, possibly due to chronic irritation or infection. Differential includes UTI versus irritation from vaginal atrophy. Concerns about antibiotic resistance due to multiple courses of antibiotics. - Order repeat urine culture and urine microscopy. - Switch antibiotic from cefdinir  to ciprofloxacin  500 mg twice daily for 3 days if symptoms persist. - Order blood draw to check kidney function and white blood cell count. - Follow up with urology on October 6th.  Postmenopausal  vaginal atrophy Vaginal atrophy likely contributing to urinary symptoms due to cessation of hormone replacement therapy in May. Symptoms include vaginal dryness and possible irritation leading to urinary frequency and burning sensation post-urination. Current treatment includes vaginal estrogen cream, which is less systemically absorbed and may alleviate symptoms of atrophy. She has concerns about systemic absorption and prefers topical treatment. - Continue vaginal estrogen cream, start with 1 mg every night for 2 weeks, then reduce to twice a week. - Consider pelvic floor therapy for urinary frequency and incontinence.      Return if symptoms worsen or fail to improve.        Rockie Agent, MD  [facility] [phone] (phone) [phone] (fax)  [facility]

## 2024-03-20 LAB — URINALYSIS, MICROSCOPIC ONLY
Bacteria, UA: NONE SEEN
Casts: NONE SEEN /LPF
RBC, Urine: NONE SEEN /HPF (ref 0–2)
WBC, UA: NONE SEEN /HPF (ref 0–5)

## 2024-03-20 LAB — SPECIMEN STATUS REPORT

## 2024-03-21 ENCOUNTER — Ambulatory Visit: Payer: Self-pay | Admitting: Family Medicine

## 2024-03-22 LAB — URINE CULTURE

## 2024-03-22 LAB — SPECIMEN STATUS REPORT

## 2024-04-06 ENCOUNTER — Encounter: Payer: Self-pay | Admitting: Family Medicine

## 2024-04-07 ENCOUNTER — Encounter: Payer: Self-pay | Admitting: Family Medicine

## 2024-04-08 ENCOUNTER — Encounter: Payer: Self-pay | Admitting: Family Medicine

## 2024-04-10 ENCOUNTER — Ambulatory Visit (INDEPENDENT_AMBULATORY_CARE_PROVIDER_SITE_OTHER)

## 2024-04-10 DIAGNOSIS — Z23 Encounter for immunization: Secondary | ICD-10-CM

## 2024-04-21 ENCOUNTER — Ambulatory Visit (INDEPENDENT_AMBULATORY_CARE_PROVIDER_SITE_OTHER): Admitting: Urology

## 2024-04-21 VITALS — BP 151/70 | HR 69 | Ht 63.0 in | Wt 188.0 lb

## 2024-04-21 DIAGNOSIS — N39498 Other specified urinary incontinence: Secondary | ICD-10-CM | POA: Diagnosis not present

## 2024-04-21 DIAGNOSIS — N390 Urinary tract infection, site not specified: Secondary | ICD-10-CM

## 2024-04-21 LAB — URINALYSIS, COMPLETE
Bilirubin, UA: NEGATIVE
Glucose, UA: NEGATIVE
Ketones, UA: NEGATIVE
Leukocytes,UA: NEGATIVE
Nitrite, UA: NEGATIVE
Protein,UA: NEGATIVE
Specific Gravity, UA: 1.01 (ref 1.005–1.030)
Urobilinogen, Ur: 0.2 mg/dL (ref 0.2–1.0)
pH, UA: 6 (ref 5.0–7.5)

## 2024-04-21 LAB — MICROSCOPIC EXAMINATION

## 2024-04-21 NOTE — Patient Instructions (Signed)

## 2024-04-21 NOTE — Progress Notes (Signed)
 04/21/2024 9:10 AM   Niels LELON Hummer 14-May-1946 [id number]  Referring provider: Ostwalt, Janna, PA-C [address] [place],  [place] [address]  Chief Complaint  Patient presents with   Establish Care    HPI: I was consulted to assess the patient's 2 bladder infections since May.  She had frequency and felt poorly.  It appears Cipro  worked the best.  She was always reluctant to stop oral estrogen because she thought she started getting UTIs.  She stopped in May.  She has been under a lot of stress.  She now is on vaginal estrogen cream  At baseline she voids every 2 hours gets up twice at night.  Sometimes she has urge incontinence.  She can leak with coughing sneezing.  No bedwetting.  She wears a liner and sometimes a larger pad.  Was cleared by Dr. Penne in 2019 for microscopic hematuria  She had an MRI of the abdomen in July 2025 with and without contrast and had normal kidneys and a cyst on her pancreas.  She had a positive urine culture September 2025 and July 2025  No history of kidney stones bladder surgery or bladder infection   PMH: Past Medical History:  Diagnosis Date   Allergy    Anxiety    Asthma    Asymptomatic varicose veins 2011   Breast screening, unspecified 2013   Colon polyp    Depression off & on lifetime   Diffuse cystic mastopathy 2013   GERD (gastroesophageal reflux disease)    Heart murmur    Hernia of abdominal wall    History of hiatal hernia    Hypertension    Hypothyroidism    Dr Deward   Neuromuscular disorder [facility])    Obesity, unspecified 2013   Osteoarthritis    PONV (postoperative nausea and vomiting)    Screening for obesity 2013   Skin cancer of face 2018   squamous/ left cheek   Special screening for malignant neoplasms, colon 2013    Surgical History: Past Surgical History:  Procedure Laterality Date   ABDOMINAL HYSTERECTOMY  1994   complete   BREAST CYST ASPIRATION Right 1997?   breast aspiration d/t infected  milk gland   BREAST MASS EXCISION Left 2011   COLONOSCOPY  2005   done by Dr. Dellie   COLONOSCOPY N/A 01/27/2015   Procedure: COLONOSCOPY;  Surgeon: Louanne KANDICE Dellie, MD;  Location: [facility] ENDOSCOPY;  Service: Endoscopy;  Laterality: N/A;   COLONOSCOPY WITH PROPOFOL  N/A 02/13/2020   Procedure: COLONOSCOPY WITH PROPOFOL ;  Surgeon: Dessa Reyes LELON, MD;  Location: [facility] ENDOSCOPY;  Service: Endoscopy;  Laterality: N/A;   FINE NEEDLE ASPIRATION     FOOT SURGERY Right    LIPOMA EXCISION  2010   breast bone   SKIN CANCER EXCISION Left 12/2016   under left eye, [facility]   TONGUE BIOPSY     TUBAL LIGATION  Long time ago    Home Medications:  Allergies as of 04/21/2024       Reactions   Shellfish Allergy Other (See Comments)   Throat closes   Sulfa Antibiotics Swelling   Neosporin [neomycin-bacitracin Zn-polymyx] Rash        Medication List        Accurate as of April 21, 2024  9:10 AM. If you have any questions, ask your nurse or doctor.          ascorbic acid 500 MG tablet Commonly known as: VITAMIN C Take by mouth.   aspirin  81 MG tablet Take 81 mg by mouth daily.   budesonide -formoterol  160-4.5 MCG/ACT inhaler Commonly known as: Symbicort  Inhale 2 puffs into the lungs 2 (two) times daily.   calcium-vitamin D  500-200 MG-UNIT tablet Commonly known as: OSCAL WITH D Take 1 tablet by mouth daily with breakfast.   celecoxib 100 MG capsule Commonly known as: CELEBREX Take 100 mg by mouth 2 (two) times daily. PRN   Combivent  Respimat 20-100 MCG/ACT Aers respimat Generic drug: Ipratropium-Albuterol  Inhale 1 puff into the lungs every 6 (six) hours as needed for wheezing.   escitalopram  10 MG tablet Commonly known as: LEXAPRO  TAKE 1/2 TABLET(5 MG) BY MOUTH DAILY   estradiol  0.1 MG/GM vaginal cream Commonly known as: ESTRACE  VAGINAL Apply 0.5mg  once nightly for 2 weeks and then apply twice weekly   Fish Oil Oil daily.   levothyroxine 25 MCG  tablet Commonly known as: SYNTHROID Take 25 mcg by mouth daily before breakfast. 2 on Sunday and Thursday   melatonin 3 MG Tabs tablet Take 3 mg by mouth as needed.   Pfizer-BioNT COVID-19 Vac-TriS Susp injection Generic drug: COVID-19 mRNA Vac-TriS (Pfizer)   vitamin D3 50 MCG (2000 UT) Caps Take 1 capsule by mouth daily.        Allergies:  Allergies  Allergen Reactions   Shellfish Allergy Other (See Comments)    Throat closes   Sulfa Antibiotics Swelling   Neosporin [Neomycin-Bacitracin Zn-Polymyx] Rash    Family History: Family History  Problem Relation Age of Onset   Liver cancer Mother    Thyroid  disease Mother    Diabetes Father    Hearing loss Father    Healthy Sister    Dementia Sister    Healthy Brother    Lung cancer Maternal Uncle    Arthritis Granddaughter    Lupus Granddaughter    Arthritis Granddaughter    Lupus Granddaughter    Breast cancer Other    Obesity Maternal Grandmother     Social History:  reports that she has never smoked. She has never used smokeless tobacco. She reports that she does not drink alcohol and does not use drugs.  ROS:                                        Physical Exam: There were no vitals taken for this visit.  Constitutional:  Alert and oriented, No acute distress. HEENT:  AT, moist mucus membranes.  Trachea midline, no masses.   Laboratory Data: Lab Results  Component Value Date   WBC 5.7 06/27/2022   HGB 13.8 06/27/2022   HCT 39.9 06/27/2022   MCV 94 06/27/2022   PLT 223 06/27/2022    Lab Results  Component Value Date   CREATININE 0.89 07/16/2023    No results found for: PSA  No results found for: TESTOSTERONE  Lab Results  Component Value Date   HGBA1C 5.8 (H) 07/16/2023    Urinalysis    Component Value Date/Time   APPEARANCEUR CANCELED 01/17/2024 0000   GLUCOSEU CANCELED 01/17/2024 0000   BILIRUBINUR Negative 03/19/2024 0941   BILIRUBINUR CANCELED  01/17/2024 0000   PROTEINUR Negative 03/19/2024 0941   PROTEINUR CANCELED 01/17/2024 0000   UROBILINOGEN 0.2 03/19/2024 0941   NITRITE Negative 03/19/2024 0941   NITRITE CANCELED 01/17/2024 0000   LEUKOCYTESUR Small (1+) (A) 03/19/2024 0941   LEUKOCYTESUR CANCELED 01/17/2024 0000    Pertinent Imaging: Urine reviewed and sent  for culture.  Still has microscopic hematuria  Assessment & Plan: Patient has recurrent UTIs.  Local estrogen cream was discussed.  Role of additional prophylaxis discussed.  Role of cystoscopy discussed.  We decided to keep her on the estrogen cream twice a week that she started 6 weeks ago.  Hopefully this will be enough.  Otherwise I think she is open to starting a once a day oral prophylactic antibiotic.  Call if culture positive and return for cystoscopy  1. Other urinary incontinence (Primary)  - Urinalysis, Complete   No follow-ups on file.  Glendia DELENA Elizabeth, MD  [facility] [address], [address] [place], [place] [address] [phone]

## 2024-04-24 ENCOUNTER — Ambulatory Visit: Payer: Self-pay

## 2024-04-24 LAB — CULTURE, URINE COMPREHENSIVE

## 2024-04-24 MED ORDER — NITROFURANTOIN MACROCRYSTAL 100 MG PO CAPS: 100.0000 mg | ORAL_CAPSULE | Freq: Two times a day (BID) | ORAL | 0 refills | Status: AC

## 2024-04-27 ENCOUNTER — Encounter: Payer: Self-pay | Admitting: Urology

## 2024-04-29 ENCOUNTER — Other Ambulatory Visit: Payer: Self-pay | Admitting: Family Medicine

## 2024-04-29 DIAGNOSIS — J4521 Mild intermittent asthma with (acute) exacerbation: Secondary | ICD-10-CM

## 2024-04-29 NOTE — Telephone Encounter (Unsigned)
 Copied from CRM [id number]. Topic: Clinical - Medication Refill >> Apr 29, 2024  2:13 PM Rachelle R wrote: Medication: COMBIVENT  RESPIMAT 20-100 MCG/ACT AERS respimat  Has the patient contacted their pharmacy? Yes, call dr  This is the patient's preferred pharmacy:  [id number] [place], [place] - [address] AT [facility] OF [address] [place] [address] Phone: [phone] Fax: [phone]  Is this the correct pharmacy for this prescription? Yes If no, delete pharmacy and type the correct one.   Has the prescription been filled recently? No  Is the patient out of the medication? No  Has the patient been seen for an appointment in the last year OR does the patient have an upcoming appointment? Yes  Can we respond through MyChart? Yes  Agent: Please be advised that Rx refills may take up to 3 business days. We ask that you follow-up with your pharmacy.

## 2024-05-01 MED ORDER — COMBIVENT RESPIMAT 20-100 MCG/ACT IN AERS: 1.0000 | INHALATION_SPRAY | Freq: Four times a day (QID) | RESPIRATORY_TRACT | 3 refills | Status: AC | PRN

## 2024-05-01 NOTE — Telephone Encounter (Signed)
 Requested medications are due for refill today.  unsure  Requested medications are on the active medications list.  yes  Last refill. 08/16/2021 #4 3 rf  Future visit scheduled.   yes  Notes to clinic.  Rx signed by [place]    Requested Prescriptions  Pending Prescriptions Disp Refills   Ipratropium-Albuterol  (COMBIVENT  RESPIMAT) 20-100 MCG/ACT AERS respimat 4 each 3    Sig: Inhale 1 puff into the lungs every 6 (six) hours as needed for wheezing.     Pulmonology:  Combination Products - albuterol  / ipratropium Failed - 05/01/2024  2:05 PM      Failed - Last BP in normal range    BP Readings from Last 1 Encounters:  04/21/24 (!) 151/70         Passed - Last Heart Rate in normal range    Pulse Readings from Last 1 Encounters:  04/21/24 69         Passed - Valid encounter within last 12 months    Recent Outpatient Visits           1 month ago Increased urinary frequency   [facility] Simmons-Robinson, [place], MD   1 month ago Dysuria   [facility] Gasper Nancyann BRAVO, MD   3 months ago Urinary incontinence, mixed   [facility] Capron, Lauraine SAILOR, DO   3 months ago Stress incontinence of urine   [facility] Pound, [place], PA-C   4 months ago Urinary tract infection with hematuria, site unspecified   [facility] Lesage, Janna, PA-C

## 2024-05-26 ENCOUNTER — Ambulatory Visit: Payer: Self-pay | Admitting: *Deleted

## 2024-05-26 NOTE — Telephone Encounter (Signed)
 Patient call note and symptoms reviewed. Agree with scheduled appt. Will evaluate during OV

## 2024-05-26 NOTE — Telephone Encounter (Signed)
 FYI Only or Action Required?: FYI only for provider: appointment scheduled on 05/27/24 and if earlier appt with PCP please call patient back.  Patient was last seen in primary care on 03/19/2024 by Sharma Coyer, MD.  Called Nurse Triage reporting Cough.  Symptoms began several days ago. Saturday   Interventions attempted: OTC medications: cough medication .  Symptoms are: gradually worsening.  Triage Disposition: See Physician Within 24 Hours  Patient/caregiver understands and will follow disposition?: Yes   Please advise if earlier appt with PCP available today or tomorrow.             Copied from CRM [id number]. Topic: Clinical - Red Word Triage >> May 26, 2024  2:25 PM Meredith Scott wrote: Virus since friday it started getting better but its now getting worst. She now has Laryngitis and increase coughing. Pt also has athma. Reason for Disposition  [1] Known COPD or other severe lung disease (i.e., bronchiectasis, cystic fibrosis, lung surgery) AND [2] symptoms getting worse (i.e., increased sputum purulence or amount, increased breathing difficulty  Answer Assessment - Initial Assessment Questions Appt scheduled for tomorrow. Offered earlier appt in the day and patient would like to see PCP. Recommended if sx worsening , wheezing comes back or chest tightness, go to UC or ED.        1. ONSET: When did the cough begin?      Saturday  2. SEVERITY: How bad is the cough today?      Productive green sputum hx asthma  3. SPUTUM: Describe the color of your sputum (e.g., none, dry cough; clear, white, yellow, green)     Green  4. HEMOPTYSIS: Are you coughing up any blood? If Yes, ask: How much? (e.g., flecks, streaks, tablespoons, etc.)     Na  5. DIFFICULTY BREATHING: Are you having difficulty breathing? If Yes, ask: How bad is it? (e.g., mild, moderate, severe)      None  6. FEVER: Do you have a fever? If Yes, ask: What is your temperature, how  was it measured, and when did it start?     na 7. CARDIAC HISTORY: Do you have any history of heart disease? (e.g., heart attack, congestive heart failure)      na 8. LUNG HISTORY: Do you have any history of lung disease?  (e.g., pulmonary embolus, asthma, emphysema)     Hx asthma  9. PE RISK FACTORS: Do you have a history of blood clots? (or: recent major surgery, recent prolonged travel, bedridden)     na 10. OTHER SYMPTOMS: Do you have any other symptoms? (e.g., runny nose, wheezing, chest pain)       Felt tight chested last night. Wheezing comes and goes none now. Productive cough green sputum. Hx asthma. Laryngitis. Sx worsening at night. .  11. PREGNANCY: Is there any chance you are pregnant? When was your last menstrual period?       na 12. TRAVEL: Have you traveled out of the country in the last month? (e.g., travel history, exposures)       na  Protocols used: Cough - Acute Productive-A-AH

## 2024-05-27 ENCOUNTER — Encounter: Payer: Self-pay | Admitting: Family Medicine

## 2024-05-27 ENCOUNTER — Ambulatory Visit: Admitting: Family Medicine

## 2024-05-27 VITALS — BP 128/66 | HR 69 | Temp 98.6°F | Ht 63.0 in | Wt 187.4 lb

## 2024-05-27 DIAGNOSIS — R051 Acute cough: Secondary | ICD-10-CM

## 2024-05-27 DIAGNOSIS — J0101 Acute recurrent maxillary sinusitis: Secondary | ICD-10-CM

## 2024-05-27 DIAGNOSIS — J040 Acute laryngitis: Secondary | ICD-10-CM | POA: Diagnosis not present

## 2024-05-27 LAB — POCT INFLUENZA A/B
Influenza A, POC: NEGATIVE
Influenza B, POC: NEGATIVE

## 2024-05-27 LAB — POC COVID19 BINAXNOW: SARS Coronavirus 2 Ag: NEGATIVE

## 2024-05-27 MED ORDER — PROMETHAZINE-DM 6.25-15 MG/5ML PO SYRP: 5.0000 mL | ORAL_SOLUTION | Freq: Four times a day (QID) | ORAL | 0 refills | Status: AC | PRN

## 2024-05-27 MED ORDER — AMOXICILLIN-POT CLAVULANATE 875-125 MG PO TABS: 1.0000 | ORAL_TABLET | Freq: Two times a day (BID) | ORAL | 0 refills | Status: AC

## 2024-05-27 NOTE — Progress Notes (Signed)
 ACUTE VISIT   Patient: Meredith Scott   DOB: [birth year]   78 y.o. Female  MRN: [id number]   PCP: Sharma Coyer, MD  Chief Complaint  Patient presents with  . URI    Onset Saturday, cough productive with green mucus, headache, some wheezing, nasal congestion with clear mucus.  No fever body aches or chills   . Ear Pain    Patient also reports some ear pain with other issues she has going on    Subjective    HPI HPI     URI    Additional comments: Onset Saturday, cough productive with green mucus, headache, some wheezing, nasal congestion with clear mucus.  No fever body aches or chills         Ear Pain    Additional comments: Patient also reports some ear pain with other issues she has going on       Last edited by Cherry Chiquita HERO, CMA on 05/27/2024  4:24 PM.       Discussed the use of AI scribe software for clinical note transcription with the patient, who gave verbal consent to proceed.  History of Present Illness      Medications: Outpatient Medications Prior to Visit  Medication Sig  . ascorbic acid (VITAMIN C) 500 MG tablet Take by mouth.   SABRA aspirin 81 MG tablet Take 81 mg by mouth daily.  . budesonide -formoterol  (SYMBICORT ) 160-4.5 MCG/ACT inhaler Inhale 2 puffs into the lungs 2 (two) times daily.  . calcium-vitamin D  (OSCAL WITH D) 500-200 MG-UNIT tablet Take 1 tablet by mouth daily with breakfast.   . celecoxib (CELEBREX) 100 MG capsule Take 100 mg by mouth 2 (two) times daily. PRN  . Cholecalciferol (VITAMIN D3) 50 MCG (2000 UT) CAPS Take 1 capsule by mouth daily.  . escitalopram  (LEXAPRO ) 10 MG tablet TAKE 1/2 TABLET(5 MG) BY MOUTH DAILY  . estradiol  (ESTRACE  VAGINAL) 0.1 MG/GM vaginal cream Apply 0.5mg  once nightly for 2 weeks and then apply twice weekly  . Fish Oil OIL daily.  . Ipratropium-Albuterol  (COMBIVENT  RESPIMAT) 20-100 MCG/ACT AERS respimat Inhale 1 puff into the lungs every 6 (six) hours as needed for wheezing.  SABRA  levothyroxine (SYNTHROID, LEVOTHROID) 25 MCG tablet Take 25 mcg by mouth daily before breakfast. 2 on Sunday and Thursday  . melatonin 3 MG TABS tablet Take 3 mg by mouth as needed.  SABRA PFIZER-BIONT COVID-19 VAC-TRIS SUSP injection    No facility-administered medications prior to visit.    {Insert previous labs (optional):23779} {See past labs  Heme  Chem  Endocrine  Serology  Results Review (optional):1}   Objective    BP 128/66 (BP Location: Left Arm, Patient Position: Sitting, Cuff Size: Normal) Comment: manual  Pulse 69   Temp 98.6 F (37 C) (Oral)   Ht 5' 3 (1.6 m)   Wt 187 lb 6.4 oz (85 kg)   SpO2 98%   BMI 33.20 kg/m  {Insert last BP/Wt (optional):23777}{See vitals history (optional):1}  Physical Exam   Physical Exam    Results for orders placed or performed in visit on 05/27/24  POCT Influenza A/B  Result Value Ref Range   Influenza A, POC Negative Negative   Influenza B, POC Negative Negative  POC COVID-19 BinaxNow  Result Value Ref Range   SARS Coronavirus 2 Ag Negative Negative    Assessment & Plan     Assessment and Plan Assessment & Plan       No follow-ups on file.  Rockie Agent, MD  [facility] [phone] (phone) [phone] (fax)  [facility]

## 2024-05-28 NOTE — Patient Instructions (Signed)
 To keep you healthy, please keep in mind the following health maintenance items that you are due for:   Health Maintenance Due  Topic Date Due   Mammogram  01/04/2023   COVID-19 Vaccine (8 - 2025-26 season) 03/17/2024     Best Wishes,   Dr. Lang

## 2024-05-29 NOTE — Progress Notes (Signed)
 Meredith Scott                                          MRN: [id number]   05/29/2024   The [facility] Quality Team Specialist reviewed this patient medical record for the purposes of chart review for care gap closure. The following were reviewed: abstraction for care gap closure-controlling blood pressure.    [facility] Quality Team

## 2024-06-07 ENCOUNTER — Other Ambulatory Visit: Payer: Self-pay | Admitting: Family Medicine

## 2024-06-07 DIAGNOSIS — F320 Major depressive disorder, single episode, mild: Secondary | ICD-10-CM

## 2024-06-16 DIAGNOSIS — J45909 Unspecified asthma, uncomplicated: Secondary | ICD-10-CM | POA: Diagnosis not present

## 2024-06-16 DIAGNOSIS — I10 Essential (primary) hypertension: Secondary | ICD-10-CM | POA: Diagnosis not present

## 2024-06-16 DIAGNOSIS — E66811 Obesity, class 1: Secondary | ICD-10-CM | POA: Diagnosis not present

## 2024-06-16 DIAGNOSIS — I340 Nonrheumatic mitral (valve) insufficiency: Secondary | ICD-10-CM | POA: Diagnosis not present

## 2024-06-19 ENCOUNTER — Telehealth: Payer: Self-pay

## 2024-06-19 DIAGNOSIS — R7303 Prediabetes: Secondary | ICD-10-CM

## 2024-06-19 DIAGNOSIS — E559 Vitamin D deficiency, unspecified: Secondary | ICD-10-CM

## 2024-06-19 DIAGNOSIS — R030 Elevated blood-pressure reading, without diagnosis of hypertension: Secondary | ICD-10-CM

## 2024-06-19 DIAGNOSIS — R600 Localized edema: Secondary | ICD-10-CM

## 2024-06-19 DIAGNOSIS — Z13220 Encounter for screening for lipoid disorders: Secondary | ICD-10-CM

## 2024-06-19 DIAGNOSIS — Z130 Encounter for screening for diseases of the blood and blood-forming organs and certain disorders involving the immune mechanism: Secondary | ICD-10-CM

## 2024-06-19 DIAGNOSIS — K219 Gastro-esophageal reflux disease without esophagitis: Secondary | ICD-10-CM

## 2024-06-19 DIAGNOSIS — E039 Hypothyroidism, unspecified: Secondary | ICD-10-CM

## 2024-06-19 DIAGNOSIS — E7800 Pure hypercholesterolemia, unspecified: Secondary | ICD-10-CM

## 2024-06-19 DIAGNOSIS — K5790 Diverticulosis of intestine, part unspecified, without perforation or abscess without bleeding: Secondary | ICD-10-CM

## 2024-06-19 NOTE — Telephone Encounter (Signed)
 Copied from CRM #[id number]. Topic: Clinical - Request for Lab/Test Order >> Jun 19, 2024  8:56 AM Rosaria BRAVO wrote: Reason for CRM: Pt would like to have her lab orders placed for her CPE. She wants to have her physical labs when she comes for her AWV. Please advise    ----------------------------------------------------------------------- From previous Reason for Contact - Cancel/Reschedule: Patient/patient representative is calling to cancel or reschedule an appointment. Refer to attachments for appointment information.

## 2024-06-20 NOTE — Telephone Encounter (Signed)
 Labs ordered, recommend having labs drawn within 1-2 weeks of scheduled appt

## 2024-06-20 NOTE — Telephone Encounter (Signed)
 LVM requesting patient call back, ok for E2C2 to give message to patient.

## 2024-06-22 ENCOUNTER — Encounter: Payer: Self-pay | Admitting: Family Medicine

## 2024-06-23 ENCOUNTER — Other Ambulatory Visit: Payer: Self-pay | Admitting: Family Medicine

## 2024-06-23 DIAGNOSIS — F320 Major depressive disorder, single episode, mild: Secondary | ICD-10-CM

## 2024-06-25 ENCOUNTER — Telehealth: Payer: Self-pay

## 2024-06-25 NOTE — Telephone Encounter (Signed)
 Copied from CRM #[id number]. Topic: Clinical - Medication Question >> Jun 25, 2024  2:20 PM Montie POUR wrote: Reason for CRM:  Please have a nurse call Shaqueta at [phone] to discuss doctor's note: Sharma Coyer, MD to Meredith Scott   06/23/24  3:32 PM Hello Meredith Scott,   My apologies for the confusion. Please use the 1gram dose for application.   She is confused on what to do.

## 2024-07-07 ENCOUNTER — Other Ambulatory Visit: Admitting: Urology

## 2024-07-07 ENCOUNTER — Ambulatory Visit: Admitting: Urology

## 2024-07-16 ENCOUNTER — Encounter: Admitting: Family Medicine

## 2024-07-21 ENCOUNTER — Ambulatory Visit: Admitting: Urology

## 2024-07-21 VITALS — BP 159/77 | HR 74 | Ht 63.0 in | Wt 185.0 lb

## 2024-07-21 DIAGNOSIS — N390 Urinary tract infection, site not specified: Secondary | ICD-10-CM | POA: Diagnosis not present

## 2024-07-21 DIAGNOSIS — N39498 Other specified urinary incontinence: Secondary | ICD-10-CM

## 2024-07-21 MED ORDER — LIDOCAINE HCL URETHRAL/MUCOSAL 2 % EX GEL
1.0000 | Freq: Once | CUTANEOUS | Status: AC
  Administered 2024-07-21: 1 via URETHRAL

## 2024-07-21 NOTE — Progress Notes (Signed)
 "  07/21/2024 9:38 AM   Meredith Scott 1945/12/20 [id number]  Referring provider: Sharma Coyer, MD [address] [place],  [place] [address]  Chief Complaint  Patient presents with   Cysto    HPI: I was consulted to assess the patient's 2 bladder infections since May.  She had frequency and felt poorly.  It appears Cipro  worked the best.  She was always reluctant to stop oral estrogen because she thought she started getting UTIs.  She stopped in May.  She has been under a lot of stress.  She now is on vaginal estrogen cream   At baseline she voids every 2 hours gets up twice at night.  Sometimes she has urge incontinence.  She can leak with coughing sneezing.  No bedwetting.  She wears a liner and sometimes a larger pad.   Was cleared by Dr. Penne in 2019 for microscopic hematuria   She had an MRI of the abdomen in July 2025 with and without contrast and had normal kidneys and a cyst on her pancreas.  She had a positive urine culture September 2025 and July 2025    Patient has recurrent UTIs. Local estrogen cream was discussed. Role of additional prophylaxis discussed. Role of cystoscopy discussed. We decided to keep her on the estrogen cream twice a week that she started 6 weeks ago. Hopefully this will be enough. Otherwise I think she is open to starting a once a day oral prophylactic antibiotic. Call if culture positive and return for cystoscopy   Today Frequency stable.  Last urine culture positive She says she is feeling very well on estrogen twice a week.  Clinically not infected Patient underwent flexible cystoscopy.  Bladder mucosa and trigone were normal.  No cystitis.  No carcinoma. Patient had grade 2 hypermobility the bladder neck and negative cough test before and after cystoscopy. Patient had microscopic hematuria and urine sent for culture Patient has had microscopic hematuria in the past and was cleared by Dr. Penne.  She had an MRI of  the abdomen with normal kidneys January 24, 2024 for stable cystic lesions of pancreas and she has had sequential MRIs over the last few years.  PMH: Past Medical History:  Diagnosis Date   Allergy    Anxiety    Asthma    Asymptomatic varicose veins 2011   Breast screening, unspecified 2013   Colon polyp    Depression off & on lifetime   Diffuse cystic mastopathy 2013   GERD (gastroesophageal reflux disease)    Heart murmur    Hernia of abdominal wall    History of hiatal hernia    Hypertension    Hypothyroidism    Dr Deward   Neuromuscular disorder [facility])    Obesity, unspecified 2013   Osteoarthritis    PONV (postoperative nausea and vomiting)    Screening for obesity 2013   Skin cancer of face 2018   squamous/ left cheek   Special screening for malignant neoplasms, colon 2013    Surgical History: Past Surgical History:  Procedure Laterality Date   ABDOMINAL HYSTERECTOMY  1994   complete   BREAST CYST ASPIRATION Right 1997?   breast aspiration d/t infected milk gland   BREAST MASS EXCISION Left 2011   COLONOSCOPY  2005   done by Dr. Dellie   COLONOSCOPY N/A 01/27/2015   Procedure: COLONOSCOPY;  Surgeon: Louanne KANDICE Dellie, MD;  Location: [facility] ENDOSCOPY;  Service: Endoscopy;  Laterality: N/A;   COLONOSCOPY WITH PROPOFOL  N/A 02/13/2020  Procedure: COLONOSCOPY WITH PROPOFOL ;  Surgeon: Dessa Reyes ORN, MD;  Location: [facility] ENDOSCOPY;  Service: Endoscopy;  Laterality: N/A;   FINE NEEDLE ASPIRATION     FOOT SURGERY Right    LIPOMA EXCISION  2010   breast bone   SKIN CANCER EXCISION Left 12/2016   under left eye, [facility]   TONGUE BIOPSY     TUBAL LIGATION  Long time ago    Home Medications:  Allergies as of 07/21/2024       Reactions   Shellfish Allergy Other (See Comments)   Throat closes   Sulfa Antibiotics Swelling   Neosporin [neomycin-bacitracin Zn-polymyx] Rash        Medication List        Accurate as of July 21, 2024  9:38 AM. If  you have any questions, ask your nurse or doctor.          ascorbic acid 500 MG tablet Commonly known as: VITAMIN C Take by mouth.   aspirin 81 MG tablet Take 81 mg by mouth daily.   budesonide -formoterol  160-4.5 MCG/ACT inhaler Commonly known as: Symbicort  Inhale 2 puffs into the lungs 2 (two) times daily.   calcium-vitamin D  500-200 MG-UNIT tablet Commonly known as: OSCAL WITH D Take 1 tablet by mouth daily with breakfast.   celecoxib 100 MG capsule Commonly known as: CELEBREX Take 100 mg by mouth 2 (two) times daily. PRN   Combivent  Respimat 20-100 MCG/ACT Aers respimat Generic drug: Ipratropium-Albuterol  Inhale 1 puff into the lungs every 6 (six) hours as needed for wheezing.   escitalopram  10 MG tablet Commonly known as: LEXAPRO  TAKE 1/2 TABLET(5 MG) BY MOUTH DAILY   estradiol  0.1 MG/GM vaginal cream Commonly known as: ESTRACE  VAGINAL Apply 0.5mg  once nightly for 2 weeks and then apply twice weekly What changed:  how much to take how to take this when to take this additional instructions   Fish Oil Oil daily.   levothyroxine 25 MCG tablet Commonly known as: SYNTHROID Take 25 mcg by mouth daily before breakfast. 2 on Sunday and Thursday   melatonin 3 MG Tabs tablet Take 3 mg by mouth as needed.   Pfizer-BioNT COVID-19 Vac-TriS Susp injection Generic drug: COVID-19 mRNA Vac-TriS [employer])   promethazine -dextromethorphan 6.25-15 MG/5ML syrup Commonly known as: PROMETHAZINE -DM Take 5 mLs by mouth 4 (four) times daily as needed.   vitamin D3 50 MCG (2000 UT) Caps Take 1 capsule by mouth daily.        Allergies: Allergies[1]  Family History: Family History  Problem Relation Age of Onset   Liver cancer Mother    Thyroid  disease Mother    Diabetes Father    Hearing loss Father    Healthy Sister    Dementia Sister    Healthy Brother    Lung cancer Maternal Uncle    Arthritis Granddaughter    Lupus Granddaughter    Arthritis Granddaughter     Lupus Granddaughter    Breast cancer Other    Obesity Maternal Grandmother     Social History:  reports that she has never smoked. She has never used smokeless tobacco. She reports that she does not drink alcohol and does not use drugs.  ROS:                                        Physical Exam: BP (!) 159/77 (BP Location: Left Arm, Patient Position: Sitting, Cuff Size:  Large)   Pulse 74   Ht 5' 3 (1.6 m)   Wt 83.9 kg   SpO2 98%   BMI 32.77 kg/m   Constitutional:  Alert and oriented, No acute distress. HEENT: [place] AT, moist mucus membranes.  Trachea midline, no masses.  Laboratory Data: Lab Results  Component Value Date   WBC 5.7 06/27/2022   HGB 13.8 06/27/2022   HCT 39.9 06/27/2022   MCV 94 06/27/2022   PLT 223 06/27/2022    Lab Results  Component Value Date   CREATININE 0.89 07/16/2023    No results found for: PSA  No results found for: TESTOSTERONE  Lab Results  Component Value Date   HGBA1C 5.8 (H) 07/16/2023    Urinalysis    Component Value Date/Time   APPEARANCEUR Clear 04/21/2024 0915   GLUCOSEU Negative 04/21/2024 0915   BILIRUBINUR Negative 04/21/2024 0915   PROTEINUR Negative 04/21/2024 0915   UROBILINOGEN 0.2 03/19/2024 0941   NITRITE Negative 04/21/2024 0915   LEUKOCYTESUR Negative 04/21/2024 0915    Pertinent Imaging: Urine reviewed and sent for culture  Assessment & Plan: Clinically patient does not have a UTI.  Call if culture positive.  Reassess in 6 months.  I do not think she needs a CT scan for the microscopic hematuria based upon previous CT scan and multiple MRIs.  She has had microscopic hematuria for many.  If the culture is positive it will represent colonization asymptomatic  1. Other urinary incontinence  - Urinalysis, Complete  2. Recurrent urinary tract infection (Primary)  - Urinalysis, Complete - lidocaine  (XYLOCAINE ) 2 % jelly 1 Application   No follow-ups on file.  Meredith DELENA Elizabeth,  MD  [facility] [address] [place], [place] [address] [phone]     [1]  Allergies Allergen Reactions   Shellfish Allergy Other (See Comments)    Throat closes   Sulfa Antibiotics Swelling   Neosporin [Neomycin-Bacitracin Zn-Polymyx] Rash   "

## 2024-07-22 LAB — MICROSCOPIC EXAMINATION: Epithelial Cells (non renal): >10 /HPF — AB (ref 0–10)

## 2024-07-22 LAB — URINALYSIS, COMPLETE
Bilirubin, UA: NEGATIVE
Glucose, UA: NEGATIVE
Ketones, UA: NEGATIVE
Leukocytes,UA: NEGATIVE
Nitrite, UA: NEGATIVE
Protein,UA: NEGATIVE
Specific Gravity, UA: 1.015 (ref 1.005–1.030)
Urobilinogen, Ur: 0.2 mg/dL (ref 0.2–1.0)
pH, UA: 6 (ref 5.0–7.5)

## 2024-07-23 LAB — CULTURE, URINE COMPREHENSIVE

## 2024-07-25 ENCOUNTER — Encounter: Payer: Self-pay | Admitting: Urology

## 2024-09-03 ENCOUNTER — Encounter: Admitting: Family Medicine

## 2024-12-15 ENCOUNTER — Ambulatory Visit: Admitting: Urology
